# Patient Record
Sex: Female | Born: 1990
Health system: Southern US, Community
[De-identification: ages and names within clinical notes are randomized; demographics above are authoritative.]

## PROBLEM LIST (undated history)

## (undated) ENCOUNTER — Ambulatory Visit: Payer: PRIVATE HEALTH INSURANCE

## (undated) DIAGNOSIS — Z9889 Other specified postprocedural states: Secondary | ICD-10-CM

## (undated) DIAGNOSIS — A609 Anogenital herpesviral infection, unspecified: Secondary | ICD-10-CM

## (undated) DIAGNOSIS — Z113 Encounter for screening for infections with a predominantly sexual mode of transmission: Secondary | ICD-10-CM

## (undated) DIAGNOSIS — Z309 Encounter for contraceptive management, unspecified: Secondary | ICD-10-CM

## (undated) DIAGNOSIS — Z202 Contact with and (suspected) exposure to infections with a predominantly sexual mode of transmission: Secondary | ICD-10-CM

## (undated) DIAGNOSIS — E559 Vitamin D deficiency, unspecified: Secondary | ICD-10-CM

## (undated) DIAGNOSIS — N760 Acute vaginitis: Secondary | ICD-10-CM

## (undated) DIAGNOSIS — D649 Anemia, unspecified: Secondary | ICD-10-CM

## (undated) DIAGNOSIS — IMO0001 Reserved for inherently not codable concepts without codable children: Secondary | ICD-10-CM

## (undated) DIAGNOSIS — R300 Dysuria: Secondary | ICD-10-CM

## (undated) DIAGNOSIS — N898 Other specified noninflammatory disorders of vagina: Secondary | ICD-10-CM

## (undated) DIAGNOSIS — N390 Urinary tract infection, site not specified: Secondary | ICD-10-CM

## (undated) DIAGNOSIS — B9689 Other specified bacterial agents as the cause of diseases classified elsewhere: Secondary | ICD-10-CM

## (undated) HISTORY — DX: Encounter for contraceptive management, unspecified: Z30.9

## (undated) HISTORY — DX: Anogenital herpesviral infection, unspecified: A60.9

## (undated) HISTORY — DX: Anemia, unspecified: D64.9

## (undated) HISTORY — DX: Contact with and (suspected) exposure to infections with a predominantly sexual mode of transmission: Z20.2

## (undated) HISTORY — DX: Reserved for inherently not codable concepts without codable children: IMO0001

## (undated) HISTORY — PX: OTHER SURGICAL HISTORY: SHX169

## (undated) HISTORY — DX: Acute vaginitis: N76.0

## (undated) HISTORY — DX: Other specified bacterial agents as the cause of diseases classified elsewhere: B96.89

## (undated) HISTORY — DX: Other specified noninflammatory disorders of vagina: N89.8

## (undated) HISTORY — DX: Other specified postprocedural states: Z98.890

## (undated) HISTORY — DX: Vitamin D deficiency, unspecified: E55.9

## (undated) HISTORY — DX: Encounter for screening for infections with a predominantly sexual mode of transmission: Z11.3

## (undated) HISTORY — DX: Dysuria: R30.0

---
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
## 2008-05-23 ENCOUNTER — Emergency Department (HOSPITAL_COMMUNITY): Admission: EM | Admit: 2008-05-23 | Discharge: 2008-05-23 | Payer: Self-pay | Admitting: Emergency Medicine

## 2010-04-14 ENCOUNTER — Inpatient Hospital Stay (HOSPITAL_COMMUNITY)
Admission: AD | Admit: 2010-04-14 | Discharge: 2010-04-14 | Payer: Self-pay | Source: Home / Self Care | Admitting: Obstetrics & Gynecology

## 2010-04-29 ENCOUNTER — Inpatient Hospital Stay (HOSPITAL_COMMUNITY)
Admission: AD | Admit: 2010-04-29 | Discharge: 2010-05-01 | Payer: Self-pay | Source: Home / Self Care | Attending: Obstetrics and Gynecology | Admitting: Obstetrics and Gynecology

## 2010-04-30 DIAGNOSIS — O165 Unspecified maternal hypertension, complicating the puerperium: Secondary | ICD-10-CM

## 2010-07-26 ENCOUNTER — Emergency Department (HOSPITAL_COMMUNITY)
Admission: EM | Admit: 2010-07-26 | Discharge: 2010-07-26 | Disposition: A | Discharge status: Home / Self Care | Payer: Medicaid Other | Attending: Emergency Medicine | Admitting: Emergency Medicine

## 2010-07-26 DIAGNOSIS — H00019 Hordeolum externum unspecified eye, unspecified eyelid: Secondary | ICD-10-CM | POA: Insufficient documentation

## 2010-07-26 DIAGNOSIS — H5789 Other specified disorders of eye and adnexa: Secondary | ICD-10-CM | POA: Insufficient documentation

## 2010-08-03 LAB — RPR: RPR Ser Ql: NONREACTIVE

## 2010-08-03 LAB — CBC
HCT: 32 % — ABNORMAL LOW (ref 36.0–46.0)
Hemoglobin: 10.3 g/dL — ABNORMAL LOW (ref 12.0–15.0)
MCH: 25.9 pg — ABNORMAL LOW (ref 26.0–34.0)
MCHC: 32.2 g/dL (ref 30.0–36.0)
MCV: 80.3 fL (ref 78.0–100.0)
Platelets: 199 10*3/uL (ref 150–400)
RBC: 3.98 MIL/uL (ref 3.87–5.11)
RDW: 14.7 % (ref 11.5–15.5)
WBC: 12.1 10*3/uL — ABNORMAL HIGH (ref 4.0–10.5)

## 2010-08-03 LAB — WET PREP, GENITAL
Trich, Wet Prep: NONE SEEN
Yeast Wet Prep HPF POC: NONE SEEN

## 2010-08-03 LAB — GC/CHLAMYDIA PROBE AMP, GENITAL
Chlamydia, DNA Probe: NEGATIVE
GC Probe Amp, Genital: NEGATIVE

## 2010-10-08 NOTE — H&P (Signed)
April Tyler, April Tyler NO.:  [id number]   MEDICAL RECORD NO.:  [id number]          PATIENT TYPE:  INP   LOCATION:  A413                          FACILITY:  [facility]   PHYSICIAN:  Langley Gauss, MD     DATE OF BIRTH:  Nov 05, 1990   DATE OF ADMISSION:  06/11/2005  DATE OF DISCHARGE:  LH                                HISTORY & PHYSICAL   HISTORY:  The patient is a 20 year old gravida 1, para 0 at [redacted] weeks  gestation who presents to [facility] afternoon of June 11, 2005  complaining of increased frequency and intensity of uterine contractions.  Initial examination she was found to be 2 cm dilated. During this  observation period she is noted to have increased frequency and intensity of  uterine contractions such that she is admitted in labor. Membranes were  intact. Prenatal course notably is uncomplicated. The patient did decline  the maternal serum AFP screening. The patient has had serial ultrasounds  which have documented adequate fetal growth and normal anatomic survey. One-  hour GTT is normal at 80. She is sickle negative. GBS carrier status is  reportedly negative. A positive blood type.   PAST MEDICAL HISTORY:  No previous hospitalizations. This is her first  pregnancy.   SOCIAL HISTORY:  The patient is currently living with the grandmother. She  is no longer involved in a relationship with the father of the baby.   PHYSICAL EXAMINATION:  GENERAL:  She is found to be in no acute distress.  VITAL SIGNS:  Blood pressure is 115/70, pulse rate of 80, respiratory rate  is 20.  HEENT:  Negative. No adenopathy. Neck is supple. Thyroid is nonpalpable.  CHEST:  Lungs are clear. Cardiovascular exam is regular rate and rhythm.  EXTREMITIES:  Normal.  ABDOMEN:  Gravid uterus identified. Vertex presentation by Leopold's  maneuvers.  PELVIC:  Normal external genitalia, no lesions or ulcerations identified. No  vaginal bleeding, no leakage of fluid. Cervix 3+  cm dilated, 90% effaced,  vertex at 0 station. External fetal monitor reveals reassuring fetal heart  rate baseline 150, accelerations noted, uterine contractions occurring q.3-5  minutes.   ASSESSMENT:  A 20 year old patient [redacted] weeks gestation presents in early  stages of labor with cervical change documented during this observation  period. The patient as noted in labor at this time. Subsequently, we will  augment with Pitocin as clinically indicated.      Langley Gauss, MD  Electronically Signed    DC/MEDQ  D:  06/12/2005  T:  06/12/2005  Job:  [id number]   cc:   [facility]

## 2010-10-08 NOTE — Op Note (Signed)
NAMEMARIZOL, BORROR NO.:  [id number]   MEDICAL RECORD NO.:  [id number]          PATIENT TYPE:  INP   LOCATION:  A412                          FACILITY:  [facility]   PHYSICIAN:  Tilda Burrow, M.D. DATE OF BIRTH:  1991/05/01   DATE OF PROCEDURE:  05/15/2006  DATE OF DISCHARGE:                               OPERATIVE REPORT   Yanisa presented in early morning hours in labor, progressed rapidly  to completely dilated.  She pushed through a brief second stage.  The  baby was found to be in a persistent occiput posterior presentation in a  rather military position of the head.  The orbits and eyebrow could be  palpable beneath the symphysis pubis.  She made some progress, but was  slowing in her progress with variable decelerations becoming more  pronounced.  Vacuum assistance was then offered, accepted and utilized  to place on the back of the head and to use traction through two  contractions to bring baby into a more flexed head position and to  descend slightly.  The vacuum popped off twice due to the generous hair  on the baby and inadequate seal.  Therefore, we discontinued it and the  patient was able to use her own pushing powers to bring the baby to  crowning position to deliver over perineum without episiotomy.  There  was first degree laceration.  She delivered direct OP position.  The  anterior shoulder was released and then the fetal body expelled the rest  of the way without difficulty.  Apgars were 9 and 9.  Amniotic fluid was  clear without malodor.  The small laceration did not require repair.  The patient did not have an epidural.  Placenta delivered intact.  Tomasa Blase presentation, three-vessel cord confirmed.  Infant was a healthy  female infant, Apgars 9 and 9.      Tilda Burrow, M.D.  Electronically Signed     JVF/MEDQ  D:  05/15/2006  T:  05/15/2006  Job:  [id number]

## 2010-10-08 NOTE — H&P (Signed)
April Tyler, April Tyler NO.:  [id number]   MEDICAL RECORD NO.:  [id number]          PATIENT TYPE:  INP   LOCATION:  A412                          FACILITY:  [facility]   PHYSICIAN:  Tilda Burrow, M.D. DATE OF BIRTH:  1990/10/25   DATE OF ADMISSION:  05/15/2006  DATE OF DISCHARGE:  LH                              HISTORY & PHYSICAL   HISTORY OF PRESENT ILLNESS:  [name], a 20 year old  multiparous female, is admitted after pregnancy course followed through  our office.  The patient presents at approximately 6:00 A.M. on May 15, 2006 with active contractions.  Cervix is 4 cm dilated.  Her  prenatal course includes blood type A positive, urine drug screen  negative, Rubella immunity present, hemoglobin 11, hematocrit 34.  Hepatitis, HIV, Gonorrhea and Chlamydia all negative.  Group B Strep  positive with prior pregnancy.  Sickle test negative.  She plans on  having medicines; she requested IV medicines initially.   PAST MEDICAL HISTORY:  None.   PAST SURGICAL HISTORY:  Negative.   ALLERGIES:  None.   SOCIAL HISTORY:  Single. Lives with her family.  Paternity is uncertain.   PLAN:  Prognosis for vaginal delivery is good.      Tilda Burrow, M.D.  Electronically Signed     JVF/MEDQ  D:  05/15/2006  T:  05/15/2006  Job:  [id number]

## 2010-10-08 NOTE — Op Note (Signed)
NAMEBREEA, LONCAR NO.:  [id number]   MEDICAL RECORD NO.:  [id number]          PATIENT TYPE:  INP   LOCATION:  A413                          FACILITY:  [facility]   PHYSICIAN:  Langley Gauss, MD     DATE OF BIRTH:  1991-01-09   DATE OF PROCEDURE:  06/12/2005  DATE OF DISCHARGE:                                 OPERATIVE REPORT   DELIVERY NOTE:   DIAGNOSIS:  Term pregnancy presenting in labor.   DELIVERY FORM:  Spontaneous assisted vaginal of 8 pounds 2 ounce infant.  Midline episiotomy repair.   ANALGESIA:  The patient received IV Nubain and Phenergan during the course  of labor. Total of 30 mL 1% lidocaine plain injected in the perineal body at  time of delivery.   TOTAL ESTIMATED BLOOD LOSS:  Less than 500 mL.   DELIVERY PERFORMED BY:  Dr. Langley Gauss.   COMPLICATIONS:  None.   SPECIMENS:  Arterial cord gas to cord blood laboratory. Placenta is examined  noted be apparently intact with three-vessel umbilical cord.   SUMMARY:  A 20 year old gravida 1, para 0 with an EDC of June 17, 2005  who presented June 11, 2005 complaining of increased frequency and  intensity of uterine contractions.  The was noted to have cervical change to  3+ centimeters during observation period. Thus she was admitted in labor.  Subsequently, she did require Pitocin augmentation due to inadequate  frequency and of uterine contractions.  She progressed rapidly along the  labor curve to complete dilatation with a strong urge to push.  She was  placed in dorsal lithotomy position, prepped and draped usual sterile  manner. She pushed well with a short second stage of labor with distension  of the perineal body, 30 mL 1% lidocaine plain is injected, small midline  episiotomy was performed, infant delivered in direct OA position over this  midline episiotomy without extension.  Mouth and nose bulb suctioned of  clear amniotic fluid. Spontaneous rotation occurred to the right  anterior  shoulder position. Gentle traction combined expulsive efforts, resulted in  delivery of anterior shoulder as well as remaining infant without  difficulty. Umbilical cord is then milked toward the infant, cord was doubly  clamped and cut. Infant is taken to nursery table for immediate assessment  after cord gas and cord blood was obtained. Gentle traction of umbilical  cord results in separation which upon examination is noted be intact  placenta with three-vessel umbilical cord.  __________  delivery.  Examination of the genital tract reveals midline episiotomy which is not  extended.  This was repaired with __________  0 chromic in a running  locked fashion on vaginal mucosa followed by two-layer closure of 0 chromic  on the perineal body. The patient tolerated delivery very well. She is taken  out of dorsal lithotomy position and allowed to bond with the infant.  Apparently it is the patient's father who was in attendance at time of  delivery.      Langley Gauss, MD  Electronically Signed     DC/MEDQ  D:  06/12/2005  T:  06/12/2005  Job:  [id number]   cc:   [facility]

## 2011-09-01 ENCOUNTER — Other Ambulatory Visit (HOSPITAL_COMMUNITY)
Admission: RE | Admit: 2011-09-01 | Discharge: 2011-09-01 | Disposition: A | Discharge status: Home / Self Care | Payer: Medicaid Other | Source: Ambulatory Visit | Attending: Obstetrics and Gynecology | Admitting: Obstetrics and Gynecology

## 2011-09-01 DIAGNOSIS — Z113 Encounter for screening for infections with a predominantly sexual mode of transmission: Secondary | ICD-10-CM | POA: Insufficient documentation

## 2011-09-01 DIAGNOSIS — Z01419 Encounter for gynecological examination (general) (routine) without abnormal findings: Secondary | ICD-10-CM | POA: Insufficient documentation

## 2011-10-11 ENCOUNTER — Emergency Department (HOSPITAL_COMMUNITY)
Admission: EM | Admit: 2011-10-11 | Discharge: 2011-10-11 | Disposition: A | Discharge status: Home / Self Care | Payer: Medicaid Other | Attending: Emergency Medicine | Admitting: Emergency Medicine

## 2011-10-11 ENCOUNTER — Encounter (HOSPITAL_COMMUNITY): Payer: Self-pay

## 2011-10-11 DIAGNOSIS — L270 Generalized skin eruption due to drugs and medicaments taken internally: Secondary | ICD-10-CM | POA: Insufficient documentation

## 2011-10-11 DIAGNOSIS — T360X5A Adverse effect of penicillins, initial encounter: Secondary | ICD-10-CM | POA: Insufficient documentation

## 2011-10-11 DIAGNOSIS — L500 Allergic urticaria: Secondary | ICD-10-CM | POA: Insufficient documentation

## 2011-10-11 DIAGNOSIS — T7840XA Allergy, unspecified, initial encounter: Secondary | ICD-10-CM

## 2011-10-11 MED ORDER — AZITHROMYCIN 250 MG PO TABS: ORAL_TABLET | ORAL | Status: DC

## 2011-10-11 MED ORDER — PREDNISONE 50 MG PO TABS: 50.0000 mg | ORAL_TABLET | Freq: Every day | ORAL | Status: AC

## 2011-10-11 MED ORDER — AZITHROMYCIN 250 MG PO TABS
500.0000 mg | ORAL_TABLET | Freq: Once | ORAL | Status: AC
  Administered 2011-10-11: 500 mg via ORAL
  Filled 2011-10-11: qty 2

## 2011-10-11 MED ORDER — PREDNISONE 20 MG PO TABS
60.0000 mg | ORAL_TABLET | Freq: Once | ORAL | Status: AC
  Administered 2011-10-11: 60 mg via ORAL
  Filled 2011-10-11: qty 3

## 2011-10-11 NOTE — ED Notes (Signed)
Was dx w/ strep throat 2 weeks ago, took 6 days of antibiotic and then broke out in rash to face, neck and chest, denies any sob.

## 2011-10-11 NOTE — Discharge Instructions (Signed)

## 2011-10-11 NOTE — ED Provider Notes (Signed)
History     CSN: [id number]  Arrival date & time 10/11/11  1114   None     Chief Complaint  Patient presents with  . Rash    (Consider location/radiation/quality/duration/timing/severity/associated sxs/prior treatment) HPI Comments: Seen last week at an urgent care.  Prescribed amoxicillin for strep throat.  Took 5 days but stopped several days ago because she broke out in a rash.  No difficulty breathing or swallowing.  Not taking any meds to tx.  Patient is a 21 y.o. female presenting with rash. The history is provided by the patient. No language interpreter was used.  Rash  This is a new problem. The problem has not changed since onset.The problem is associated with new medications. There has been no fever. The pain is moderate. The pain has been constant since onset. Associated symptoms include itching. She has tried nothing for the symptoms. Risk factors include new medications.    History reviewed. No pertinent past medical history.  History reviewed. No pertinent past surgical history.  No family history on file.  History  Substance Use Topics  . Smoking status: Never Smoker   . Smokeless tobacco: Not on file  . Alcohol Use: Yes     occ    OB History    Grav Para Term Preterm Abortions TAB SAB Ect Mult Living                  Review of Systems  HENT: Negative for sore throat.   Respiratory: Negative for shortness of breath and wheezing.   Skin: Positive for itching and rash.       Urticaria     Allergies  Review of patient's allergies indicates no known allergies.  Home Medications   Current Outpatient Rx  Name Route Sig Dispense Refill  . AZITHROMYCIN 250 MG PO TABS  Take one tab po QD (initial dose given in the ED). 4 tablet 0  . PREDNISONE 50 MG PO TABS Oral Take 1 tablet (50 mg total) by mouth daily. 6 tablet 0    BP 129/77  Pulse 78  Temp(Src) 98.2 F (36.8 C) (Oral)  Resp 20  Ht 5\' 8"  (1.727 m)  Wt 220 lb (99.791 kg)  BMI 33.45 kg/m2   SpO2 100%  LMP 10/05/2011  Physical Exam  Nursing note and vitals reviewed. Constitutional: She is oriented to person, place, and time. She appears well-developed and well-nourished. No distress.  HENT:  Head: Normocephalic and atraumatic.  Eyes: EOM are normal.  Neck: Normal range of motion.  Cardiovascular: Normal rate, regular rhythm and normal heart sounds.   Pulmonary/Chest: Effort normal and breath sounds normal. No accessory muscle usage. Not tachypneic. No respiratory distress. She has no decreased breath sounds.  Abdominal: Soft. She exhibits no distension. There is no tenderness.  Musculoskeletal: Normal range of motion.  Neurological: She is alert and oriented to person, place, and time.  Skin: Skin is warm and dry.       Urticaria  Scattered over face, neck  Psychiatric: She has a normal mood and affect. Judgment normal.    ED Course  Procedures (including critical care time)  Labs Reviewed - No data to display No results found.   1. Allergic reaction caused by a drug       MDM  zithromax Prednisone.  Benadryl, pepcid.  Return prn.        Worthy Rancher, PA 10/11/11 1253

## 2011-10-11 NOTE — ED Provider Notes (Signed)
Medical screening examination/treatment/procedure(s) were performed by non-physician practitioner and as supervising physician I was immediately available for consultation/collaboration.   Marleen Moret W Cloie Wooden, MD 10/11/11 1538 

## 2011-10-11 NOTE — ED Notes (Signed)
Rash to face and trunk over 1 week.  Rash started after taking amoxicillin for 5-6 days.  Was taking med for strep throat and did not   Complete.  No sob or difficulty swallowing.

## 2012-08-07 ENCOUNTER — Ambulatory Visit: Payer: Self-pay

## 2012-09-19 ENCOUNTER — Ambulatory Visit: Payer: Medicaid Other | Admitting: Obstetrics & Gynecology

## 2012-09-19 ENCOUNTER — Encounter: Payer: Self-pay | Admitting: *Deleted

## 2013-01-31 ENCOUNTER — Encounter (HOSPITAL_COMMUNITY): Payer: Self-pay | Admitting: Emergency Medicine

## 2013-01-31 ENCOUNTER — Emergency Department (HOSPITAL_COMMUNITY)
Admission: EM | Admit: 2013-01-31 | Discharge: 2013-02-01 | Disposition: A | Discharge status: Home / Self Care | Payer: Medicaid Other | Attending: Emergency Medicine | Admitting: Emergency Medicine

## 2013-01-31 DIAGNOSIS — Z3202 Encounter for pregnancy test, result negative: Secondary | ICD-10-CM | POA: Insufficient documentation

## 2013-01-31 DIAGNOSIS — R3915 Urgency of urination: Secondary | ICD-10-CM | POA: Insufficient documentation

## 2013-01-31 DIAGNOSIS — R300 Dysuria: Secondary | ICD-10-CM

## 2013-01-31 DIAGNOSIS — R350 Frequency of micturition: Secondary | ICD-10-CM | POA: Insufficient documentation

## 2013-01-31 LAB — URINALYSIS, ROUTINE W REFLEX MICROSCOPIC
Bilirubin Urine: NEGATIVE
Glucose, UA: NEGATIVE mg/dL
Hgb urine dipstick: NEGATIVE
Ketones, ur: NEGATIVE mg/dL
Nitrite: NEGATIVE
Protein, ur: NEGATIVE mg/dL
Specific Gravity, Urine: 1.027 (ref 1.005–1.030)
Urobilinogen, UA: 0.2 mg/dL (ref 0.0–1.0)
pH: 5.5 (ref 5.0–8.0)

## 2013-01-31 LAB — URINE MICROSCOPIC-ADD ON

## 2013-01-31 LAB — POCT PREGNANCY, URINE: Preg Test, Ur: NEGATIVE

## 2013-01-31 NOTE — ED Notes (Signed)
Pt. reports dysuria for several days , denies hematuria , no fever or chills.

## 2013-02-01 LAB — URINE CULTURE
Colony Count: NO GROWTH
Culture: NO GROWTH

## 2013-02-01 NOTE — ED Notes (Signed)
Pt dc to home. Pt sts understanding to dc instructions. Pt ambulatory to exit without difficulty. 

## 2013-02-01 NOTE — ED Provider Notes (Signed)
CSN: [id number]     Arrival date & time 01/31/13  2108 History   First MD Initiated Contact with Patient 01/31/13 2355     Chief Complaint  Patient presents with  . Dysuria   (Consider location/radiation/quality/duration/timing/severity/associated sxs/prior Treatment) HPI Comments: Patient presents for dysuria x 4 months. Patient states that symptoms have been intermittent without any aggravating factors. States symptoms mildly improved with AZO tabs. She describes pain with voiding as pressure like. Endorses associated urinary urgency and frequency. She denies fever, hematuria, N/V, abdominal pain, back pain, vaginal d/c, pelvic pain, diarrhea, and pain with defecation.  Patient is a 22 y.o. female presenting with dysuria.  Dysuria   History reviewed. No pertinent past medical history. History reviewed. No pertinent past surgical history. No family history on file. History  Substance Use Topics  . Smoking status: Never Smoker   . Smokeless tobacco: Not on file  . Alcohol Use: Yes     Comment: occ   OB History   Grav Para Term Preterm Abortions TAB SAB Ect Mult Living                 Review of Systems  Genitourinary: Positive for dysuria.  All other systems reviewed and are negative.    Allergies  Review of patient's allergies indicates no known allergies.  Home Medications   Current Outpatient Rx  Name  Route  Sig  Dispense  Refill  . acetaminophen (TYLENOL) 325 MG tablet   Oral   Take 650 mg by mouth every 6 (six) hours as needed for pain.         . cyanocobalamin 500 MCG tablet   Oral   Take 500-1,000 mcg by mouth daily.         Marland Kitchen triamcinolone cream (KENALOG) 0.5 %   Topical   Apply 1 application topically as needed (ecezma).          BP 146/73  Pulse 89  Temp(Src) 99.1 F (37.3 C) (Oral)  Resp 14  SpO2 98%  LMP 01/04/2013  Physical Exam  Nursing note and vitals reviewed. Constitutional: She is oriented to person, place, and time. She appears  well-developed and well-nourished. No distress.  HENT:  Head: Normocephalic and atraumatic.  Mouth/Throat: Oropharynx is clear and moist. No oropharyngeal exudate.  Eyes: Conjunctivae and EOM are normal. Pupils are equal, round, and reactive to light. No scleral icterus.  Neck: Normal range of motion.  Cardiovascular: Normal rate, regular rhythm and intact distal pulses.   Pulmonary/Chest: Effort normal. No respiratory distress. She has no wheezes. She has no rales.  Abdominal: Soft. She exhibits no distension. There is no tenderness.  Musculoskeletal: Normal range of motion.  Neurological: She is alert and oriented to person, place, and time.  Skin: Skin is warm and dry. No rash noted. She is not diaphoretic. No erythema. No pallor.  Psychiatric: She has a normal mood and affect. Her behavior is normal.    ED Course  Procedures (including critical care time) Labs Review Labs Reviewed  URINALYSIS, ROUTINE W REFLEX MICROSCOPIC - Abnormal; Notable for the following:    APPearance CLOUDY (*)    Leukocytes, UA SMALL (*)    All other components within normal limits  URINE MICROSCOPIC-ADD ON - Abnormal; Notable for the following:    Squamous Epithelial / LPF MANY (*)    Bacteria, UA FEW (*)    All other components within normal limits  URINE CULTURE  POCT PREGNANCY, URINE   Imaging Review No results found.  MDM   1. Dysuria    22 y/o otherwise healthy female presenting for intermittent dysuria x 4 months. Patient well and nontoxic appearing, hemodynamically stable, and afebrie. Physical exam unremarkable; no abdominal TTP or CVA TTP. Urine pregnancy negative and UA not suggestive of infection; few bacteria, small leuks, and negative nitrites. Patient stable and appropriate for d/c with PCP follow up. Advised that urine culture pending. Return precautions discussed and patient agreeable to plan with no unaddressed concerns.    Antony Madura, PA-C 02/02/13 1100

## 2013-02-03 NOTE — ED Provider Notes (Signed)
Medical screening examination/treatment/procedure(s) were performed by non-physician practitioner and as supervising physician I was immediately available for consultation/collaboration.  Jasmine Awe, MD 02/03/13 [phone]

## 2013-09-20 ENCOUNTER — Encounter (HOSPITAL_COMMUNITY): Payer: Self-pay | Admitting: Emergency Medicine

## 2013-09-20 ENCOUNTER — Emergency Department (HOSPITAL_COMMUNITY)
Admission: EM | Admit: 2013-09-20 | Discharge: 2013-09-20 | Discharge status: Against Medical Advice | Payer: Medicaid Other | Attending: Emergency Medicine | Admitting: Emergency Medicine

## 2013-09-20 DIAGNOSIS — M259 Joint disorder, unspecified: Secondary | ICD-10-CM | POA: Insufficient documentation

## 2013-09-20 DIAGNOSIS — R109 Unspecified abdominal pain: Secondary | ICD-10-CM | POA: Insufficient documentation

## 2013-09-20 DIAGNOSIS — N898 Other specified noninflammatory disorders of vagina: Secondary | ICD-10-CM | POA: Insufficient documentation

## 2013-09-20 DIAGNOSIS — M899 Disorder of bone, unspecified: Secondary | ICD-10-CM | POA: Insufficient documentation

## 2013-09-20 DIAGNOSIS — O9989 Other specified diseases and conditions complicating pregnancy, childbirth and the puerperium: Secondary | ICD-10-CM | POA: Insufficient documentation

## 2013-09-20 DIAGNOSIS — O99891 Other specified diseases and conditions complicating pregnancy: Secondary | ICD-10-CM | POA: Insufficient documentation

## 2013-09-20 DIAGNOSIS — Z79899 Other long term (current) drug therapy: Secondary | ICD-10-CM | POA: Insufficient documentation

## 2013-09-20 DIAGNOSIS — R300 Dysuria: Secondary | ICD-10-CM | POA: Insufficient documentation

## 2013-09-20 DIAGNOSIS — R509 Fever, unspecified: Secondary | ICD-10-CM | POA: Insufficient documentation

## 2013-09-20 DIAGNOSIS — IMO0002 Reserved for concepts with insufficient information to code with codable children: Secondary | ICD-10-CM | POA: Insufficient documentation

## 2013-09-20 DIAGNOSIS — Z87440 Personal history of urinary (tract) infections: Secondary | ICD-10-CM | POA: Insufficient documentation

## 2013-09-20 DIAGNOSIS — H5710 Ocular pain, unspecified eye: Secondary | ICD-10-CM | POA: Insufficient documentation

## 2013-09-20 DIAGNOSIS — O26899 Other specified pregnancy related conditions, unspecified trimester: Secondary | ICD-10-CM

## 2013-09-20 DIAGNOSIS — M549 Dorsalgia, unspecified: Secondary | ICD-10-CM | POA: Insufficient documentation

## 2013-09-20 HISTORY — DX: Urinary tract infection, site not specified: N39.0

## 2013-09-20 LAB — PREGNANCY, URINE: Preg Test, Ur: POSITIVE — AB

## 2013-09-20 LAB — URINALYSIS, ROUTINE W REFLEX MICROSCOPIC
Bilirubin Urine: NEGATIVE
Glucose, UA: NEGATIVE mg/dL
Hgb urine dipstick: NEGATIVE
Ketones, ur: NEGATIVE mg/dL
Nitrite: NEGATIVE
Protein, ur: NEGATIVE mg/dL
Specific Gravity, Urine: 1.025 (ref 1.005–1.030)
Urobilinogen, UA: 0.2 mg/dL (ref 0.0–1.0)
pH: 6 (ref 5.0–8.0)

## 2013-09-20 LAB — URINE MICROSCOPIC-ADD ON

## 2013-09-20 MED ORDER — CEPHALEXIN 500 MG PO CAPS
500.0000 mg | ORAL_CAPSULE | Freq: Three times a day (TID) | ORAL | Status: DC
Start: 2013-09-20 — End: 2013-11-29

## 2013-09-20 NOTE — ED Provider Notes (Signed)
CSN: [id number]     Arrival date & time 09/20/13  1614 History   First MD Initiated Contact with Patient 09/20/13 1622     Chief Complaint  Patient presents with  . Dysuria     (Consider location/radiation/quality/duration/timing/severity/associated sxs/prior Treatment) Patient is a 23 y.o. female presenting with dysuria. The history is provided by the patient.  Dysuria Pain quality:  Burning Pain severity:  Moderate Duration:  2 weeks Timing:  Constant Progression:  Worsening Chronicity:  New Recent urinary tract infections: no   Relieved by:  Nothing Worsened by:  Nothing tried Ineffective treatments:  Cranberry juice and phenazopyridine Urinary symptoms: foul-smelling urine and frequent urination   Urinary symptoms: no hematuria and no bladder incontinence   Associated symptoms: fever, flank pain, nausea and vaginal discharge   Associated symptoms: no genital lesions and no vomiting  Abdominal pain: cramping.   Risk factors: sexually active and sexually transmitted infections   Risk factors: no hx of pyelonephritis, not pregnant, no recurrent urinary tract infections, not single kidney and no urinary catheter    April Tyler is a 23 y.o. G5P3, LMP 08/19/2013 who presents to the ED with dysuria. She states that she had the same symptoms early in April and took Azo. Got better for a while but now has returned and not getting better. Hx of STI during her last pregnancy. She is using no birth control.   Past Medical History  Diagnosis Date  . UTI (lower urinary tract infection)    History reviewed. No pertinent past surgical history. History reviewed. No pertinent family history. History  Substance Use Topics  . Smoking status: Never Smoker   . Smokeless tobacco: Not on file  . Alcohol Use: Yes     Comment: occ   OB History   Grav Para Term Preterm Abortions TAB SAB Ect Mult Living   5 3             Review of Systems  Constitutional: Positive for fever and chills.   HENT: Negative.   Eyes: Positive for pain. Negative for visual disturbance.  Respiratory: Negative for cough and wheezing.   Cardiovascular: Negative for chest pain.  Gastrointestinal: Positive for nausea. Negative for vomiting. Abdominal pain: cramping.  Genitourinary: Positive for dysuria, flank pain and vaginal discharge.  Musculoskeletal: Positive for back pain.  Skin: Negative for rash.  Psychiatric/Behavioral: Negative for confusion. The patient is not nervous/anxious.       Allergies  Review of patient's allergies indicates no known allergies.  Home Medications   Prior to Admission medications   Medication Sig Start Date End Date Taking? Authorizing Provider  acetaminophen (TYLENOL) 325 MG tablet Take 650 mg by mouth every 6 (six) hours as needed for pain.    Historical Provider, MD  cyanocobalamin 500 MCG tablet Take 500-1,000 mcg by mouth daily.    Historical Provider, MD  triamcinolone cream (KENALOG) 0.5 % Apply 1 application topically as needed (ecezma).    Historical Provider, MD   BP 129/74  Pulse 90  Temp(Src) 98.1 F (36.7 C) (Oral)  Resp 16  Ht 5\' 8"  (1.727 m)  Wt 210 lb (95.255 kg)  BMI 31.94 kg/m2  SpO2 100%  LMP 08/19/2013 Physical Exam  Nursing note and vitals reviewed. Constitutional: She is oriented to person, place, and time. She appears well-developed and well-nourished.  HENT:  Head: Normocephalic.  Eyes: Conjunctivae and EOM are normal.  Neck: Neck supple.  Cardiovascular: Normal rate.   Pulmonary/Chest: Effort normal.  Musculoskeletal: Normal  range of motion.  Neurological: She is alert and oriented to person, place, and time. No cranial nerve deficit.  Skin: Skin is warm and dry.  Psychiatric: She has a normal mood and affect. Her behavior is normal.   Results for orders placed during the hospital encounter of 09/20/13 (from the past 24 hour(s))  URINALYSIS, ROUTINE W REFLEX MICROSCOPIC     Status: Abnormal   Collection Time     09/20/13  4:23 PM      Result Value Ref Range   Color, Urine YELLOW  YELLOW   APPearance CLEAR  CLEAR   Specific Gravity, Urine 1.025  1.005 - 1.030   pH 6.0  5.0 - 8.0   Glucose, UA NEGATIVE  NEGATIVE mg/dL   Hgb urine dipstick NEGATIVE  NEGATIVE   Bilirubin Urine NEGATIVE  NEGATIVE   Ketones, ur NEGATIVE  NEGATIVE mg/dL   Protein, ur NEGATIVE  NEGATIVE mg/dL   Urobilinogen, UA 0.2  0.0 - 1.0 mg/dL   Nitrite NEGATIVE  NEGATIVE   Leukocytes, UA SMALL (*) NEGATIVE  PREGNANCY, URINE     Status: Abnormal   Collection Time    09/20/13  4:23 PM      Result Value Ref Range   Preg Test, Ur POSITIVE (*) NEGATIVE  URINE MICROSCOPIC-ADD ON     Status: Abnormal   Collection Time    09/20/13  4:23 PM      Result Value Ref Range   Squamous Epithelial / LPF MANY (*) RARE   WBC, UA 3-6  <3 WBC/hpf   Bacteria, UA FEW (*) RARE     ED Course: I discussed this case with Dr. Fonnie JarvisBednar.   Procedures  MDM  23 y.o. female with abdominal pain and positive pregnancy test. Discussed with the patient that the treatment plan would be pelvic exam, labs and ultrasound. The patient states that she does not want any of that done. She just wants treated for her UTI. I explained that her urine did not look infected and that with her history of STI's she could have an infection that caused dysuria that was not a UTI. I discussed that her previous STI's put her at risk for ectopic pregnancy. She declined any further treatment. I encouraged her to return immediately for worsening symptoms. I discussed complications associated with ectopic pregnancy including death. She voices understanding and signed out AMA. I did send her urine for culture and gave Rx for antibiotics. I also sent a message to Dr. Emelda FearFerguson since she is a patient in his office.     [facility] Orlene OchM Lachandra Dettmann, [place] 09/20/13 [phone]

## 2013-09-20 NOTE — Discharge Instructions (Signed)
Your pregnancy test today is positive. Since you are having pain it is possible that you could have a pregnancy in your tube. You are signing out today against medical advice. If you have an ectopic pregnancy it could rupture and you could die.   Abdominal Pain During Pregnancy Abdominal pain is common in pregnancy. Most of the time, it does not cause harm. There are many causes of abdominal pain. Some causes are more serious than others. Some of the causes of abdominal pain in pregnancy are easily diagnosed. Occasionally, the diagnosis takes time to understand. Other times, the cause is not determined. Abdominal pain can be a sign that something is very wrong with the pregnancy, or the pain may have nothing to do with the pregnancy at all. For this reason, always tell your health care provider if you have any abdominal discomfort. HOME CARE INSTRUCTIONS  Monitor your abdominal pain for any changes. The following actions may help to alleviate any discomfort you are experiencing:  Do not have sexual intercourse or put anything in your vagina until your symptoms go away completely.  Get plenty of rest until your pain improves.  Drink clear fluids if you feel nauseous. Avoid solid food as long as you are uncomfortable or nauseous.  Only take over-the-counter or prescription medicine as directed by your health care provider.  Keep all follow-up appointments with your health care provider. SEEK IMMEDIATE MEDICAL CARE IF:  You are bleeding, leaking fluid, or passing tissue from the vagina.  You have increasing pain or cramping.  You have persistent vomiting.  You have painful or bloody urination.  You have a fever.  You notice a decrease in your baby's movements.  You have extreme weakness or feel faint.  You have shortness of breath, with or without abdominal pain.  You develop a severe headache with abdominal pain.  You have abnormal vaginal discharge with abdominal pain.  You have  persistent diarrhea.  You have abdominal pain that continues even after rest, or gets worse. MAKE SURE YOU:   Understand these instructions.  Will watch your condition.  Will get help right away if you are not doing well or get worse. Document Released: 05/09/2005 Document Revised: 02/27/2013 Document Reviewed: 12/06/2012 [facility] Patient Information 2014 [employer] [place].

## 2013-09-20 NOTE — ED Notes (Signed)
Pt c/o dysuria beginning of April took AZO and went away. States started again around mid April. Nad.

## 2013-09-23 ENCOUNTER — Other Ambulatory Visit: Payer: Self-pay | Admitting: Obstetrics and Gynecology

## 2013-09-23 DIAGNOSIS — N949 Unspecified condition associated with female genital organs and menstrual cycle: Secondary | ICD-10-CM

## 2013-09-23 DIAGNOSIS — O9989 Other specified diseases and conditions complicating pregnancy, childbirth and the puerperium: Principal | ICD-10-CM

## 2013-09-23 DIAGNOSIS — O99891 Other specified diseases and conditions complicating pregnancy: Secondary | ICD-10-CM

## 2013-09-24 ENCOUNTER — Other Ambulatory Visit: Payer: Medicaid Other

## 2013-09-25 ENCOUNTER — Ambulatory Visit (INDEPENDENT_AMBULATORY_CARE_PROVIDER_SITE_OTHER): Payer: Medicaid Other

## 2013-09-25 ENCOUNTER — Other Ambulatory Visit: Payer: Medicaid Other

## 2013-09-25 ENCOUNTER — Other Ambulatory Visit: Payer: Self-pay | Admitting: Obstetrics and Gynecology

## 2013-09-25 DIAGNOSIS — O9989 Other specified diseases and conditions complicating pregnancy, childbirth and the puerperium: Principal | ICD-10-CM

## 2013-09-25 DIAGNOSIS — Z3200 Encounter for pregnancy test, result unknown: Secondary | ICD-10-CM

## 2013-09-25 DIAGNOSIS — N949 Unspecified condition associated with female genital organs and menstrual cycle: Secondary | ICD-10-CM

## 2013-09-25 DIAGNOSIS — O99891 Other specified diseases and conditions complicating pregnancy: Secondary | ICD-10-CM

## 2013-09-25 DIAGNOSIS — N926 Irregular menstruation, unspecified: Secondary | ICD-10-CM

## 2013-09-25 NOTE — ED Provider Notes (Signed)
Medical screening examination/treatment/procedure(s) were performed by non-physician practitioner and as supervising physician I was immediately available for consultation/collaboration.   Mai Longnecker M Destenie Ingber, MD 09/25/13 [id number] 

## 2013-09-25 NOTE — Progress Notes (Signed)
U/S- transvaginal u/s- Endom with small amount of fluid noted within endometrial cavity although no gestational sac noted within, cx appears closed, small amount of free fluid noted in posterior cul-de-sac, bilateral adnexa/ovaries appear WNL no adnexal masses noted

## 2013-09-26 ENCOUNTER — Telehealth: Payer: Self-pay | Admitting: Obstetrics & Gynecology

## 2013-09-26 LAB — HCG, QUANTITATIVE, PREGNANCY: hCG, Beta Chain, Quant, S: 1554.4 m[IU]/mL

## 2013-09-26 NOTE — Telephone Encounter (Signed)
Pt aware of results, I spoke with Drenda FreezeFran and she advised to [facility] to recheck the pt's numbers. Pt verbalized understanding, pt switched to front office for appointment.

## 2013-09-27 ENCOUNTER — Other Ambulatory Visit: Payer: Medicaid Other

## 2013-09-27 DIAGNOSIS — Z3200 Encounter for pregnancy test, result unknown: Secondary | ICD-10-CM

## 2013-09-28 LAB — HCG, QUANTITATIVE, PREGNANCY: hCG, Beta Chain, Quant, S: 3531.9 m[IU]/mL

## 2013-09-30 ENCOUNTER — Telehealth: Payer: Self-pay | Admitting: Obstetrics and Gynecology

## 2013-09-30 NOTE — Telephone Encounter (Signed)
Pt informed of her HCG results from Friday, pt has appointment scheduled this Friday, advised pt to keep this appointment.

## 2013-10-03 ENCOUNTER — Other Ambulatory Visit: Payer: Self-pay | Admitting: Obstetrics & Gynecology

## 2013-10-03 DIAGNOSIS — O3680X0 Pregnancy with inconclusive fetal viability, not applicable or unspecified: Secondary | ICD-10-CM

## 2013-10-04 ENCOUNTER — Ambulatory Visit (INDEPENDENT_AMBULATORY_CARE_PROVIDER_SITE_OTHER): Payer: Medicaid Other

## 2013-10-04 ENCOUNTER — Other Ambulatory Visit: Payer: Self-pay | Admitting: Obstetrics & Gynecology

## 2013-10-04 DIAGNOSIS — O26849 Uterine size-date discrepancy, unspecified trimester: Secondary | ICD-10-CM

## 2013-10-04 DIAGNOSIS — O30009 Twin pregnancy, unspecified number of placenta and unspecified number of amniotic sacs, unspecified trimester: Secondary | ICD-10-CM

## 2013-10-04 DIAGNOSIS — O3680X0 Pregnancy with inconclusive fetal viability, not applicable or unspecified: Secondary | ICD-10-CM

## 2013-10-04 NOTE — Progress Notes (Signed)
U/S-TWIN IUP (Di/Di) noted with +YS and Fetal pole noted x 2, CRL c/w 5+wks x 2  EDD 06/03/2014, cx appears closed (3.6cm), bilateral adnexa appears wnl, unable to pick up FHT with M mode (visualized) [facility]

## 2013-10-11 ENCOUNTER — Other Ambulatory Visit: Payer: Self-pay | Admitting: Obstetrics & Gynecology

## 2013-10-11 DIAGNOSIS — O30009 Twin pregnancy, unspecified number of placenta and unspecified number of amniotic sacs, unspecified trimester: Secondary | ICD-10-CM

## 2013-10-11 DIAGNOSIS — O3680X0 Pregnancy with inconclusive fetal viability, not applicable or unspecified: Secondary | ICD-10-CM

## 2013-10-15 ENCOUNTER — Encounter: Payer: Medicaid Other | Admitting: Women's Health

## 2013-10-15 ENCOUNTER — Other Ambulatory Visit: Payer: Medicaid Other

## 2013-11-11 ENCOUNTER — Ambulatory Visit (INDEPENDENT_AMBULATORY_CARE_PROVIDER_SITE_OTHER): Payer: Medicaid Other | Admitting: Women's Health

## 2013-11-11 ENCOUNTER — Encounter: Payer: Self-pay | Admitting: Women's Health

## 2013-11-11 ENCOUNTER — Other Ambulatory Visit (HOSPITAL_COMMUNITY)
Admission: RE | Admit: 2013-11-11 | Discharge: 2013-11-11 | Disposition: A | Discharge status: Home / Self Care | Payer: Medicaid Other | Source: Ambulatory Visit | Attending: Obstetrics & Gynecology | Admitting: Obstetrics & Gynecology

## 2013-11-11 ENCOUNTER — Telehealth: Payer: Self-pay | Admitting: Obstetrics and Gynecology

## 2013-11-11 VITALS — BP 130/70 | Ht 67.0 in | Wt 245.0 lb

## 2013-11-11 DIAGNOSIS — Z113 Encounter for screening for infections with a predominantly sexual mode of transmission: Secondary | ICD-10-CM

## 2013-11-11 DIAGNOSIS — Z124 Encounter for screening for malignant neoplasm of cervix: Secondary | ICD-10-CM | POA: Insufficient documentation

## 2013-11-11 DIAGNOSIS — Z3009 Encounter for other general counseling and advice on contraception: Secondary | ICD-10-CM

## 2013-11-11 DIAGNOSIS — Z0000 Encounter for general adult medical examination without abnormal findings: Secondary | ICD-10-CM

## 2013-11-11 DIAGNOSIS — Z01419 Encounter for gynecological examination (general) (routine) without abnormal findings: Secondary | ICD-10-CM

## 2013-11-11 NOTE — Telephone Encounter (Signed)
Pt informed will do GC/CHL on urine sample, results back tomorrow.

## 2013-11-11 NOTE — Addendum Note (Signed)
Addended by: Criss AlvinePULLIAM, CHRYSTAL G on: 11/11/2013 11:00 AM   Modules accepted: Orders

## 2013-11-11 NOTE — Progress Notes (Signed)
Patient ID: April LericheKourtney A Paras, female   DOB: 12/26/1990, 23 y.o.   MRN: [id number] Subjective:   April Tyler is a 23 y.o. 836P3 African American female here for a routine well-woman exam.  She is currently 10.6wks by 5.3wk u/s, pregnant w/ di-di twin gestation and plans to terminate. Still wants pap & physical today. Discussed contraception- interested in nexplanon but caused weight gain last time, discussed mirena- would like this.  Patient's last menstrual period was 08/20/2013.    Current complaints: none PCP: none       Does desire labs, but can't do today d/t time   The following portions of the patient's history were reviewed and updated as appropriate: allergies, current medications, past family history, past medical history, past social history, past surgical history and problem list.  Past Medical History Past Medical History  Diagnosis Date  . UTI (lower urinary tract infection)     Past Surgical History History reviewed. No pertinent past surgical history.  Gynecologic History G6P3  Patient's last menstrual period was 08/20/2013. Contraception: currently pregnant Last Pap: unsure. Results were: normal Last mammogram: never. Results were: n/a Last TCS: never  Obstetric History OB History  Gravida Para Term Preterm AB SAB TAB Ectopic Multiple Living  6 3            # Outcome Date GA Lbr Len/2nd Weight Sex Delivery Anes PTL Lv  6 CUR           5 PAR           4 PAR           3 PAR           2 GRA           1 GRA               Current Medications Current Outpatient Prescriptions on File Prior to Visit  Medication Sig Dispense Refill  . cephALEXin (KEFLEX) 500 MG capsule Take 1 capsule (500 mg total) by mouth 3 (three) times daily.  20 capsule  0   No current facility-administered medications on file prior to visit.    Review of Systems Patient denies any headaches, blurred vision, shortness of breath, chest pain, abdominal pain, problems with bowel movements,  urination, or intercourse.  Objective:  BP 130/70  Ht 5\' 7"  (1.702 m)  Wt 245 lb (111.131 kg)  BMI 38.36 kg/m2  LMP 08/20/2013 Physical Exam  General:  Well developed, well nourished, no acute distress. She is alert and oriented x3. Skin:  Warm and dry Neck:  Midline trachea, no thyromegaly or nodules Cardiovascular: Regular rate and rhythm, no murmur heard Lungs:  Effort normal, all lung fields clear to auscultation bilaterally Breasts:  No dominant palpable mass, retraction, or nipple discharge Abdomen:  Soft, non tender, no hepatosplenomegaly or masses Pelvic:  External genitalia is normal in appearance.  The vagina is normal in appearance. The cervix is bulbous, no CMT.  Thin prep pap is done w/ reflex HR HPV cotesting. Uterus is felt to be normal size, shape, and contour.  No adnexal masses or tenderness noted. Extremities:  No swelling or varicosities noted Psych:  She has a normal mood and affect  Assessment:   Healthy well-woman exam 10.6wks w/ di-di twin gestation, plans termination Contraception counseling  Plan:  F/U Friday am per her preference for fasting labs: CBC, CMP, TSH, Lipid panel To call when ready to have Mirena placed, abstinence until insertion Mammogram @25yo  or sooner  if problems Colonoscopy @25yo  or sooner if problems  Marge DuncansBooker, Darely Becknell Randall CNM, [facility]NP-BC 11/11/2013 9:28 AM

## 2013-11-11 NOTE — Patient Instructions (Signed)
Levonorgestrel intrauterine device (IUD)--Mirena What is this medicine? LEVONORGESTREL IUD (LEE voe nor jes trel) is a contraceptive (birth control) device. The device is placed inside the uterus by a healthcare professional. It is used to prevent pregnancy and can also be used to treat heavy bleeding that occurs during your period. Depending on the device, it can be used for 3 to 5 years. This medicine may be used for other purposes; ask your health care provider or pharmacist if you have questions. COMMON BRAND NAME(S): Mirena, Skyla What should I tell my health care provider before I take this medicine? They need to know if you have any of these conditions: -abnormal Pap smear -cancer of the breast, uterus, or cervix -diabetes -endometritis -genital or pelvic infection now or in the past -have more than one sexual partner or your partner has more than one partner -heart disease -history of an ectopic or tubal pregnancy -immune system problems -IUD in place -liver disease or tumor -problems with blood clots or take blood-thinners -use intravenous drugs -uterus of unusual shape -vaginal bleeding that has not been explained -an unusual or allergic reaction to levonorgestrel, other hormones, silicone, or polyethylene, medicines, foods, dyes, or preservatives -pregnant or trying to get pregnant -breast-feeding How should I use this medicine? This device is placed inside the uterus by a health care professional. Talk to your pediatrician regarding the use of this medicine in children. Special care may be needed. Overdosage: If you think you have taken too much of this medicine contact a poison control center or emergency room at once. NOTE: This medicine is only for you. Do not share this medicine with others. What if I miss a dose? This does not apply. What may interact with this medicine? Do not take this medicine with any of the following  medications: -amprenavir -bosentan -fosamprenavir This medicine may also interact with the following medications: -aprepitant -barbiturate medicines for inducing sleep or treating seizures -bexarotene -griseofulvin -medicines to treat seizures like carbamazepine, ethotoin, felbamate, oxcarbazepine, phenytoin, topiramate -modafinil -pioglitazone -rifabutin -rifampin -rifapentine -some medicines to treat HIV infection like atazanavir, indinavir, lopinavir, nelfinavir, tipranavir, ritonavir -St. John's wort -warfarin This list may not describe all possible interactions. Give your health care provider a list of all the medicines, herbs, non-prescription drugs, or dietary supplements you use. Also tell them if you smoke, drink alcohol, or use illegal drugs. Some items may interact with your medicine. What should I watch for while using this medicine? Visit your doctor or health care professional for regular check ups. See your doctor if you or your partner has sexual contact with others, becomes HIV positive, or gets a sexual transmitted disease. This product does not protect you against HIV infection (AIDS) or other sexually transmitted diseases. You can check the placement of the IUD yourself by reaching up to the top of your vagina with clean fingers to feel the threads. Do not pull on the threads. It is a good habit to check placement after each menstrual period. Call your doctor right away if you feel more of the IUD than just the threads or if you cannot feel the threads at all. The IUD may come out by itself. You may become pregnant if the device comes out. If you notice that the IUD has come out use a backup birth control method like condoms and call your health care provider. Using tampons will not change the position of the IUD and are okay to use during your period. What side effects may I   I notice from receiving this medicine? Side effects that you should report to your doctor or  health care professional as soon as possible: -allergic reactions like skin rash, itching or hives, swelling of the face, lips, or tongue -fever, flu-like symptoms -genital sores -high blood pressure -no menstrual period for 6 weeks during use -pain, swelling, warmth in the leg -pelvic pain or tenderness -severe or sudden headache -signs of pregnancy -stomach cramping -sudden shortness of breath -trouble with balance, talking, or walking -unusual vaginal bleeding, discharge -yellowing of the eyes or skin Side effects that usually do not require medical attention (report to your doctor or health care professional if they continue or are bothersome): -acne -breast pain -change in sex drive or performance -changes in weight -cramping, dizziness, or faintness while the device is being inserted -headache -irregular menstrual bleeding within first 3 to 6 months of use -nausea This list may not describe all possible side effects. Call your doctor for medical advice about side effects. You may report side effects to FDA at 1-800-FDA-1088. Where should I keep my medicine? This does not apply. NOTE: This sheet is a summary. It may not cover all possible information. If you have questions about this medicine, talk to your doctor, pharmacist, or health care provider.  2015, Elsevier/Gold Standard. (2011-06-09 13:54:04)  

## 2013-11-12 ENCOUNTER — Telehealth: Payer: Self-pay | Admitting: *Deleted

## 2013-11-12 LAB — CYTOLOGY - PAP

## 2013-11-12 LAB — GC/CHLAMYDIA PROBE AMP
CT Probe RNA: NEGATIVE
GC Probe RNA: NEGATIVE

## 2013-11-12 NOTE — Telephone Encounter (Signed)
Pt informed of negative GC/CHL results from 11/11/2013. Pt states wants to have another pap done and add GC/CHL. Informed pt insurance would not cover another pap. Pt states would like a repeat on GC/CHL with another test than urine. Informed pt will route message to [name], CNM.

## 2013-11-12 NOTE — Telephone Encounter (Signed)
Pt had called back after pap/physical visit wanting to be checked for GC/CT, pap had already left office, so they added it to urine. Results were neg, but pt is not satisfied, wants on pap or cervical swab. Chrystal called lab and had it added to pap.  Cheral MarkerKimberly R. Booker, CNM, [facility]NP-BC 11/12/2013 5:14 PM

## 2013-11-15 ENCOUNTER — Other Ambulatory Visit: Payer: Medicaid Other

## 2013-11-15 ENCOUNTER — Telehealth: Payer: Self-pay | Admitting: *Deleted

## 2013-11-15 NOTE — Telephone Encounter (Signed)
Pt aware of results 

## 2013-11-19 ENCOUNTER — Other Ambulatory Visit: Payer: Medicaid Other

## 2013-11-29 ENCOUNTER — Ambulatory Visit (INDEPENDENT_AMBULATORY_CARE_PROVIDER_SITE_OTHER): Payer: Medicaid Other | Admitting: Adult Health

## 2013-11-29 ENCOUNTER — Encounter: Payer: Self-pay | Admitting: Adult Health

## 2013-11-29 VITALS — BP 120/80 | Ht 69.0 in | Wt 241.0 lb

## 2013-11-29 DIAGNOSIS — Z8742 Personal history of other diseases of the female genital tract: Secondary | ICD-10-CM

## 2013-11-29 DIAGNOSIS — N898 Other specified noninflammatory disorders of vagina: Secondary | ICD-10-CM

## 2013-11-29 DIAGNOSIS — Z9889 Other specified postprocedural states: Secondary | ICD-10-CM

## 2013-11-29 DIAGNOSIS — Z3041 Encounter for surveillance of contraceptive pills: Secondary | ICD-10-CM

## 2013-11-29 DIAGNOSIS — Z309 Encounter for contraceptive management, unspecified: Secondary | ICD-10-CM

## 2013-11-29 HISTORY — DX: Other specified noninflammatory disorders of vagina: N89.8

## 2013-11-29 HISTORY — DX: Other specified postprocedural states: Z98.890

## 2013-11-29 HISTORY — DX: Encounter for contraceptive management, unspecified: Z30.9

## 2013-11-29 LAB — POCT URINE PREGNANCY: Preg Test, Ur: POSITIVE

## 2013-11-29 LAB — POCT WET PREP (WET MOUNT)
Trichomonas Wet Prep HPF POC: NEGATIVE
WBC, Wet Prep HPF POC: POSITIVE

## 2013-11-29 MED ORDER — NORETHIN ACE-ETH ESTRAD-FE 1-20 MG-MCG PO TABS: 1.0000 | ORAL_TABLET | Freq: Every day | ORAL | Status: DC

## 2013-11-29 NOTE — Progress Notes (Signed)
Subjective:     Patient ID: April LericheKourtney A Tyler, female   DOB: 09/17/1990, 23 y.o.   MRN: [id number]  HPI [name] is a 23 year old black female, [place]Is [place] OCs and wants to continue those, will still think about IUD, as was her choice before procedure.She complains of discharge with some itching, no odor.Has not had sex since procedure.  Review of Systems See HPI Reviewed past medical,surgical, social and family history. Reviewed medications and allergies.     Objective:   Physical Exam BP 120/80  Ht 5\' 9"  (1.753 m)  Wt 241 lb (109.317 kg)  BMI 35.57 kg/m2  LMP 08/20/2013  Breastfeeding? UnknownUPT lightly positive, Skin warm and dry.Pelvic: external genitalia is normal in appearance, vagina: tan to pink discharge without odor, cervix:smooth and bulbous, uterus: normal size, shape and contour, non tender, no masses felt, adnexa: no masses or tenderness noted. Wet prep: +WBCs. GC/CHL obtained.     Assessment:     Vaginal discharge Sp elective abortion Contraceptive management    Plan:     Refilled loestrin x 1 year Use condoms GC/CHL sent, will talk next week when results back Follow up prn

## 2013-11-29 NOTE — Patient Instructions (Signed)
Continue pill Use condoms Will talk when GC/CHL back

## 2013-11-30 LAB — GC/CHLAMYDIA PROBE AMP
CT Probe RNA: NEGATIVE
GC Probe RNA: NEGATIVE

## 2013-12-02 ENCOUNTER — Telehealth: Payer: Self-pay | Admitting: Adult Health

## 2013-12-02 NOTE — Telephone Encounter (Signed)
Left message x 1. JSY 

## 2013-12-02 NOTE — Telephone Encounter (Signed)
Left message test negative 

## 2013-12-03 MED ORDER — AZITHROMYCIN 500 MG PO TABS: ORAL_TABLET | ORAL | Status: DC

## 2013-12-03 NOTE — Telephone Encounter (Signed)
Will treat with azithromycin 500 mg #2 po now

## 2013-12-03 NOTE — Telephone Encounter (Signed)
Left message x 2. JSY 

## 2013-12-03 NOTE — Telephone Encounter (Signed)
Spoke with pt letting her know GC/CHL was negative. Pt states her and her boyfriend had sex Saturday and he was diagnosed with chlamydia on Monday. Pt is requesting that you go ahead and treat her because the condom broke. Please advise. Thanks!!!

## 2014-01-31 ENCOUNTER — Encounter: Payer: Self-pay | Admitting: Adult Health

## 2014-01-31 ENCOUNTER — Ambulatory Visit (INDEPENDENT_AMBULATORY_CARE_PROVIDER_SITE_OTHER): Payer: Medicaid Other | Admitting: Adult Health

## 2014-01-31 ENCOUNTER — Telehealth: Payer: Self-pay | Admitting: Adult Health

## 2014-01-31 VITALS — BP 116/70 | Ht 69.0 in | Wt 235.0 lb

## 2014-01-31 DIAGNOSIS — N760 Acute vaginitis: Secondary | ICD-10-CM | POA: Insufficient documentation

## 2014-01-31 DIAGNOSIS — A499 Bacterial infection, unspecified: Secondary | ICD-10-CM

## 2014-01-31 DIAGNOSIS — B9689 Other specified bacterial agents as the cause of diseases classified elsewhere: Secondary | ICD-10-CM | POA: Insufficient documentation

## 2014-01-31 DIAGNOSIS — N898 Other specified noninflammatory disorders of vagina: Secondary | ICD-10-CM

## 2014-01-31 DIAGNOSIS — R300 Dysuria: Secondary | ICD-10-CM | POA: Insufficient documentation

## 2014-01-31 DIAGNOSIS — N390 Urinary tract infection, site not specified: Secondary | ICD-10-CM | POA: Insufficient documentation

## 2014-01-31 HISTORY — DX: Dysuria: R30.0

## 2014-01-31 HISTORY — DX: Other specified bacterial agents as the cause of diseases classified elsewhere: B96.89

## 2014-01-31 HISTORY — DX: Other specified bacterial agents as the cause of diseases classified elsewhere: N76.0

## 2014-01-31 LAB — POCT URINALYSIS DIPSTICK
Glucose, UA: NEGATIVE
Nitrite, UA: NEGATIVE

## 2014-01-31 LAB — POCT WET PREP (WET MOUNT): WBC, Wet Prep HPF POC: POSITIVE

## 2014-01-31 MED ORDER — PHENAZOPYRIDINE HCL 200 MG PO TABS: 200.0000 mg | ORAL_TABLET | Freq: Three times a day (TID) | ORAL | Status: DC | PRN

## 2014-01-31 MED ORDER — NITROFURANTOIN MONOHYD MACRO 100 MG PO CAPS: 100.0000 mg | ORAL_CAPSULE | Freq: Two times a day (BID) | ORAL | Status: DC

## 2014-01-31 MED ORDER — METRONIDAZOLE 500 MG PO TABS: 500.0000 mg | ORAL_TABLET | Freq: Two times a day (BID) | ORAL | Status: DC

## 2014-01-31 NOTE — Patient Instructions (Signed)
Urinary Tract Infection Urinary tract infections (UTIs) can develop anywhere along your urinary tract. Your urinary tract is your body's drainage system for removing wastes and extra water. Your urinary tract includes two kidneys, two ureters, a bladder, and a urethra. Your kidneys are a pair of bean-shaped organs. Each kidney is about the size of your fist. They are located below your ribs, one on each side of your spine. CAUSES Infections are caused by microbes, which are microscopic organisms, including fungi, viruses, and bacteria. These organisms are so small that they can only be seen through a microscope. Bacteria are the microbes that most commonly cause UTIs. SYMPTOMS  Symptoms of UTIs may vary by age and gender of the patient and by the location of the infection. Symptoms in young women typically include a frequent and intense urge to urinate and a painful, burning feeling in the bladder or urethra during urination. Older women and men are more likely to be tired, shaky, and weak and have muscle aches and abdominal pain. A fever may mean the infection is in your kidneys. Other symptoms of a kidney infection include pain in your back or sides below the ribs, nausea, and vomiting. DIAGNOSIS To diagnose a UTI, your caregiver will ask you about your symptoms. Your caregiver also will ask to provide a urine sample. The urine sample will be tested for bacteria and white blood cells. White blood cells are made by your body to help fight infection. TREATMENT  Typically, UTIs can be treated with medication. Because most UTIs are caused by a bacterial infection, they usually can be treated with the use of antibiotics. The choice of antibiotic and length of treatment depend on your symptoms and the type of bacteria causing your infection. HOME CARE INSTRUCTIONS  If you were prescribed antibiotics, take them exactly as your caregiver instructs you. Finish the medication even if you feel better after you  have only taken some of the medication.  Drink enough water and fluids to keep your urine clear or pale yellow.  Avoid caffeine, tea, and carbonated beverages. They tend to irritate your bladder.  Empty your bladder often. Avoid holding urine for long periods of time.  Empty your bladder before and after sexual intercourse.  After a bowel movement, women should cleanse from front to back. Use each tissue only once. SEEK MEDICAL CARE IF:   You have back pain.  You develop a fever.  Your symptoms do not begin to resolve within 3 days. SEEK IMMEDIATE MEDICAL CARE IF:   You have severe back pain or lower abdominal pain.  You develop chills.  You have nausea or vomiting.  You have continued burning or discomfort with urination. MAKE SURE YOU:   Understand these instructions.  Will watch your condition.  Will get help right away if you are not doing well or get worse. Document Released: 02/16/2005 Document Revised: 11/08/2011 Document Reviewed: 06/17/2011 [facility] Patient Information 2015 [place], [place]. This information is not intended to replace advice given to you by your health care provider. Make sure you discuss any questions you have with your health care provider. Bacterial Vaginosis Bacterial vaginosis is a vaginal infection that occurs when the normal balance of bacteria in the vagina is disrupted. It results from an overgrowth of certain bacteria. This is the most common vaginal infection in women of childbearing age. Treatment is important to prevent complications, especially in pregnant women, as it can cause a premature delivery. CAUSES  Bacterial vaginosis is caused by an increase  in harmful bacteria that are normally present in smaller amounts in the vagina. Several different kinds of bacteria can cause bacterial vaginosis. However, the reason that the condition develops is not fully understood. RISK FACTORS Certain activities or behaviors can put you at an  increased risk of developing bacterial vaginosis, including:  Having a new sex partner or multiple sex partners.  Douching.  Using an intrauterine device (IUD) for contraception. Women do not get bacterial vaginosis from toilet seats, bedding, swimming pools, or contact with objects around them. SIGNS AND SYMPTOMS  Some women with bacterial vaginosis have no signs or symptoms. Common symptoms include:  Grey vaginal discharge.  A fishlike odor with discharge, especially after sexual intercourse.  Itching or burning of the vagina and vulva.  Burning or pain with urination. DIAGNOSIS  Your health care provider will take a medical history and examine the vagina for signs of bacterial vaginosis. A sample of vaginal fluid may be taken. Your health care provider will look at this sample under a microscope to check for bacteria and abnormal cells. A vaginal pH test may also be done.  TREATMENT  Bacterial vaginosis may be treated with antibiotic medicines. These may be given in the form of a pill or a vaginal cream. A second round of antibiotics may be prescribed if the condition comes back after treatment.  HOME CARE INSTRUCTIONS   Only take over-the-counter or prescription medicines as directed by your health care provider.  If antibiotic medicine was prescribed, take it as directed. Make sure you finish it even if you start to feel better.  Do not have sex until treatment is completed.  Tell all sexual partners that you have a vaginal infection. They should see their health care provider and be treated if they have problems, such as a mild rash or itching.  Practice safe sex by using condoms and only having one sex partner. SEEK MEDICAL CARE IF:   Your symptoms are not improving after 3 days of treatment.  You have increased discharge or pain.  You have a fever. MAKE SURE YOU:   Understand these instructions.  Will watch your condition.  Will get help right away if you are not  doing well or get worse. FOR MORE INFORMATION  Centers for Disease Control and Prevention, Division of STD Prevention: [employer] American Sexual Health Association (ASHA): www.ashastd.org  Document Released: 05/09/2005 Document Revised: 02/27/2013 Document Reviewed: 12/19/2012 [facility] Patient Information 2015 [place], [place]. This information is not intended to replace advice given to you by your health care provider. Make sure you discuss any questions you have with your health care provider. push fluids No sex Take meds No alcohol

## 2014-01-31 NOTE — Progress Notes (Signed)
Subjective:     Patient ID: April Tyler, female   DOB: 05/05/1991, 23 y.o.   MRN: [id number]  HPI [name] is a 23 year old black female, worked in for pain with urination and discharge x 3 days , some nausea.No fever.  Review of Systems See HPI Reviewed past medical,surgical, social and family history. Reviewed medications and allergies.     Objective:   Physical Exam BP 116/70  Ht  (1.753 m)  Wt 235 lb (106.595 kg)  BMI 34.69 kg/m2  LMP 01/20/2014  Breastfeeding? [name] 2+leuks,trace protein and blood 3+ketones Skin warm and dry.Pelvic: external genitalia is normal in appearance, vagina: white discharge with odor, cervix:smooth and bulbous,negative CMT, uterus: normal size, shape and contour, non tender, no masses felt, adnexa: no masses or tenderness noted. Wet prep: + for clue cells and +WBCs. GC/CHL obtained.    Declines meds for nausea.  Assessment:     Burning with urination Vaginal discharge BV UTI    Plan:     UA C&S sent along with GC/CHL Rx flagyl 500 mg 1 bid x 7 days, no alcohol, review handout on BV and UTI   Rx macrobid 1 bid x 7 days #14 no refills Rx pyridium 200 mg 1 tid #10 no refills Follow up prn Push fluids

## 2014-01-31 NOTE — Telephone Encounter (Signed)
Spoke with pt. Pt is having pain with urination, cramping after urinating. Pt wonders if she has PID. Advised would need to be seen. Pt voiced understanding. Call transferred to front desk for appt. JSY

## 2014-02-01 LAB — URINALYSIS
Bilirubin Urine: NEGATIVE
Glucose, UA: NEGATIVE mg/dL
Ketones, ur: 15 mg/dL — AB
Nitrite: NEGATIVE
Protein, ur: NEGATIVE mg/dL
Specific Gravity, Urine: 1.023 (ref 1.005–1.030)
Urobilinogen, UA: 1 mg/dL (ref 0.0–1.0)
pH: 6.5 (ref 5.0–8.0)

## 2014-02-01 LAB — GC/CHLAMYDIA PROBE AMP
CT Probe RNA: NEGATIVE
GC Probe RNA: NEGATIVE

## 2014-02-04 LAB — URINE CULTURE: Colony Count: 10000

## 2014-03-24 ENCOUNTER — Encounter: Payer: Self-pay | Admitting: Adult Health

## 2014-04-21 ENCOUNTER — Telehealth: Payer: Self-pay | Admitting: Adult Health

## 2014-04-21 MED ORDER — VALACYCLOVIR HCL 1 G PO TABS: 1000.0000 mg | ORAL_TABLET | Freq: Every day | ORAL | Status: DC

## 2014-04-21 NOTE — Telephone Encounter (Signed)
Wants Valtrex called in for suppression, will refill x 1 year

## 2014-04-21 NOTE — Addendum Note (Signed)
Addended by: Cyril MourningGRIFFIN, JENNIFER A on: 04/21/2014 03:39 PM   Modules accepted: Orders

## 2014-04-21 NOTE — Telephone Encounter (Signed)
Left message I called 

## 2014-09-01 ENCOUNTER — Ambulatory Visit: Payer: Medicaid Other | Admitting: Obstetrics & Gynecology

## 2014-09-02 ENCOUNTER — Ambulatory Visit: Payer: Medicaid Other | Admitting: Advanced Practice Midwife

## 2014-09-09 ENCOUNTER — Ambulatory Visit: Payer: Medicaid Other | Admitting: Women's Health

## 2014-09-12 ENCOUNTER — Ambulatory Visit: Payer: Medicaid Other | Admitting: Obstetrics & Gynecology

## 2014-09-15 ENCOUNTER — Ambulatory Visit: Payer: Medicaid Other | Admitting: Obstetrics & Gynecology

## 2014-09-15 ENCOUNTER — Encounter: Payer: Self-pay | Admitting: Obstetrics & Gynecology

## 2014-09-24 ENCOUNTER — Encounter: Payer: Self-pay | Admitting: Adult Health

## 2014-09-24 ENCOUNTER — Ambulatory Visit: Payer: Medicaid Other | Admitting: Adult Health

## 2014-10-21 ENCOUNTER — Telehealth: Payer: Self-pay | Admitting: Adult Health

## 2015-02-10 ENCOUNTER — Encounter: Payer: Self-pay | Admitting: Advanced Practice Midwife

## 2015-02-10 ENCOUNTER — Other Ambulatory Visit: Payer: Medicaid Other | Admitting: Advanced Practice Midwife

## 2015-03-11 ENCOUNTER — Telehealth: Payer: Self-pay | Admitting: Adult Health

## 2015-03-12 NOTE — Telephone Encounter (Signed)
Pt called stating that she needs to be treated for Chlamydia, [facility]. Pt wanted to know if Victorino DikeJennifer would call in the Rx for her.

## 2015-03-12 NOTE — Telephone Encounter (Signed)
Boyfriend had +CHL to come tomorrow at 1 pm for physical has family planning medicaid, will treat then

## 2015-03-13 ENCOUNTER — Encounter: Payer: Self-pay | Admitting: Adult Health

## 2015-03-13 ENCOUNTER — Ambulatory Visit (INDEPENDENT_AMBULATORY_CARE_PROVIDER_SITE_OTHER): Payer: Medicaid Other | Admitting: Adult Health

## 2015-03-13 VITALS — BP 120/60 | HR 78 | Ht 68.0 in | Wt 240.0 lb

## 2015-03-13 DIAGNOSIS — Z01419 Encounter for gynecological examination (general) (routine) without abnormal findings: Secondary | ICD-10-CM

## 2015-03-13 DIAGNOSIS — Z30011 Encounter for initial prescription of contraceptive pills: Secondary | ICD-10-CM

## 2015-03-13 DIAGNOSIS — Z309 Encounter for contraceptive management, unspecified: Secondary | ICD-10-CM

## 2015-03-13 DIAGNOSIS — Z9889 Other specified postprocedural states: Secondary | ICD-10-CM

## 2015-03-13 DIAGNOSIS — Z202 Contact with and (suspected) exposure to infections with a predominantly sexual mode of transmission: Secondary | ICD-10-CM | POA: Insufficient documentation

## 2015-03-13 DIAGNOSIS — Z3202 Encounter for pregnancy test, result negative: Secondary | ICD-10-CM

## 2015-03-13 HISTORY — DX: Contact with and (suspected) exposure to infections with a predominantly sexual mode of transmission: Z20.2

## 2015-03-13 LAB — POCT URINE PREGNANCY: Preg Test, Ur: NEGATIVE

## 2015-03-13 MED ORDER — AZITHROMYCIN 500 MG PO TABS: ORAL_TABLET | ORAL | Status: DC

## 2015-03-13 MED ORDER — NORETHIN-ETH ESTRAD-FE BIPHAS 1 MG-10 MCG / 10 MCG PO TABS: 1.0000 | ORAL_TABLET | Freq: Every day | ORAL | Status: DC

## 2015-03-13 NOTE — Patient Instructions (Signed)
Start OCs today  Pap and physical in 1year Use condoms

## 2015-03-13 NOTE — Progress Notes (Signed)
Patient ID: April Tyler, female   DOB: 01/31/1991, 24 y.o.   MRN: [id number] History of Present Illness: April Tyler is a 24 year old black female in for well woman gyn exam, she had normal pap 11/11/13.She says boyfriend treated for chlamydia and she needs to be treated,she[place] at 15 weeks with twins,has been bleeding since, no pain.She is requesting birth control.   Current Medications, Allergies, Past Medical History, Past Surgical History, Family History and Social History were reviewed in [facility] electronic medical record.     Review of Systems: Patient denies any headaches, hearing loss, fatigue, blurred vision, shortness of breath, chest pain, abdominal pain, problems with bowel movements, urination, or intercourse. No joint pain or mood swings.See HPI for positives.    Physical Exam:BP 120/60 mmHg  Pulse 78  Ht 5\' 8"  (1.727 m)  Wt 240 lb (108.863 kg)  BMI 36.50 kg/m2  LMP 02/13/2015 UPT negative General:  Well developed, well nourished, no acute distress Skin:  Warm and dry Neck:  Midline trachea, normal thyroid, good ROM, no lymphadenopathy Lungs; Clear to auscultation bilaterally Breast:  No dominant palpable mass, retraction, or nipple discharge Cardiovascular: Regular rate and rhythm Abdomen:  Soft, non tender, no hepatosplenomegaly Pelvic:  External genitalia is normal in appearance, no lesions.  The vagina is normal in appearance,period like blood. Urethra has no lesions or masses. The cervix is bulbous.  Uterus is felt to be normal size, shape, and contour.  No adnexal masses or tenderness noted.Bladder is non tender, no masses felt. Extremities/musculoskeletal:  No swelling or varicosities noted, no clubbing or cyanosis Psych:  No mood changes, alert and cooperative,seems happy   Impression: Well woman gyn exam no pap, has family planning medicaid Chlamydia exposure Contraceptive management History of elective  abortion    Plan: Check HIV and RPR Thursday 10/27  GC/CHL sent on urine Rx Azithromycin 500 mg #2 2 po now Rx lo loestrin disp 1 pack take 1 daily with 11 refills, start today Use condoms

## 2015-03-15 LAB — GC/CHLAMYDIA PROBE AMP
Chlamydia trachomatis, NAA: NEGATIVE
Neisseria gonorrhoeae by PCR: NEGATIVE

## 2015-06-16 ENCOUNTER — Telehealth: Payer: Self-pay | Admitting: Adult Health

## 2015-06-16 NOTE — Telephone Encounter (Signed)
Left message to call me back.

## 2015-07-22 ENCOUNTER — Encounter: Payer: Self-pay | Admitting: *Deleted

## 2015-07-22 ENCOUNTER — Ambulatory Visit: Payer: Medicaid Other | Admitting: Advanced Practice Midwife

## 2015-07-23 ENCOUNTER — Ambulatory Visit: Payer: Medicaid Other | Admitting: Advanced Practice Midwife

## 2015-07-29 ENCOUNTER — Ambulatory Visit: Payer: BLUE CROSS/BLUE SHIELD | Admitting: Adult Health

## 2015-07-29 ENCOUNTER — Encounter: Payer: Self-pay | Admitting: Adult Health

## 2015-08-21 DIAGNOSIS — IMO0001 Reserved for inherently not codable concepts without codable children: Secondary | ICD-10-CM

## 2015-08-21 DIAGNOSIS — Z332 Encounter for elective termination of pregnancy: Secondary | ICD-10-CM

## 2015-08-21 HISTORY — PX: OTHER SURGICAL HISTORY: SHX169

## 2015-08-21 HISTORY — DX: Reserved for inherently not codable concepts without codable children: IMO0001

## 2015-08-21 HISTORY — DX: Encounter for elective termination of pregnancy: Z33.2

## 2015-09-03 ENCOUNTER — Ambulatory Visit: Payer: BLUE CROSS/BLUE SHIELD | Admitting: Adult Health

## 2015-09-03 ENCOUNTER — Encounter: Payer: Self-pay | Admitting: Adult Health

## 2015-09-10 ENCOUNTER — Telehealth: Payer: Self-pay | Admitting: Adult Health

## 2015-09-10 NOTE — Telephone Encounter (Signed)
Has ?exposure to STD to come in am a 9:45 to be checked

## 2015-09-11 ENCOUNTER — Ambulatory Visit (INDEPENDENT_AMBULATORY_CARE_PROVIDER_SITE_OTHER): Payer: BLUE CROSS/BLUE SHIELD | Admitting: Adult Health

## 2015-09-11 ENCOUNTER — Encounter: Payer: Self-pay | Admitting: Adult Health

## 2015-09-11 VITALS — BP 130/80 | HR 78 | Ht 69.0 in | Wt 241.0 lb

## 2015-09-11 DIAGNOSIS — Z113 Encounter for screening for infections with a predominantly sexual mode of transmission: Secondary | ICD-10-CM | POA: Diagnosis not present

## 2015-09-11 DIAGNOSIS — Z308 Encounter for other contraceptive management: Secondary | ICD-10-CM

## 2015-09-11 HISTORY — DX: Encounter for screening for infections with a predominantly sexual mode of transmission: Z11.3

## 2015-09-11 NOTE — Patient Instructions (Signed)
No sex Come in in 3 weeks for stat QHCG in am and insertion in pm with me

## 2015-09-11 NOTE — Progress Notes (Signed)
Subjective:     Patient ID: April Tyler, female   DOB: 04/22/1991, 25 y.o.   MRN: [id number]  HPI April Tyler is a 25 year old black female in requesting STD testing but declines blood tests, had elective abortion 08/21/15 and is on OCs abut wants to get nexplanon.   Review of Systems Patient denies any headaches, hearing loss, fatigue, blurred vision, shortness of breath, chest pain, abdominal pain, problems with bowel movements, urination, or intercourse. No joint pain or mood swings. Reviewed past medical,surgical, social and family history. Reviewed medications and allergies.     Objective:   Physical Exam BP 130/80 mmHg  Pulse 78  Ht 5\' 9"  (1.753 m)  Wt 241 lb (109.317 kg)  BMI 35.57 kg/m2 Skin warm and dry.Pelvic: external genitalia is normal in appearance no lesions, vagina: period blood with no odor,urethra has no lesions or masses noted, cervix:smooth and bulbous,non tender, uterus: normal size, shape and contour, non tender, no masses felt, adnexa: no masses or tenderness noted. Bladder is non tender and no masses felt.  GC/CHL sent on urine.    Assessment:     STD screening Contraceptive management    Plan:     GC/CHL sent on urine Order nexplanon, no sex come back in 3 weeks for stat QHCG in am and nexplanon insertion in pm  Continue OCs

## 2015-09-14 LAB — GC/CHLAMYDIA PROBE AMP
Chlamydia trachomatis, NAA: NEGATIVE
Neisseria gonorrhoeae by PCR: NEGATIVE

## 2015-09-16 ENCOUNTER — Ambulatory Visit: Payer: BLUE CROSS/BLUE SHIELD | Admitting: Adult Health

## 2015-09-23 ENCOUNTER — Encounter: Payer: BLUE CROSS/BLUE SHIELD | Admitting: Adult Health

## 2015-09-23 ENCOUNTER — Other Ambulatory Visit: Payer: BLUE CROSS/BLUE SHIELD

## 2015-10-01 ENCOUNTER — Other Ambulatory Visit: Payer: BLUE CROSS/BLUE SHIELD

## 2015-10-01 ENCOUNTER — Encounter: Payer: Self-pay | Admitting: *Deleted

## 2015-10-01 ENCOUNTER — Encounter: Payer: BLUE CROSS/BLUE SHIELD | Admitting: Adult Health

## 2015-12-21 ENCOUNTER — Ambulatory Visit (INDEPENDENT_AMBULATORY_CARE_PROVIDER_SITE_OTHER): Payer: BLUE CROSS/BLUE SHIELD | Admitting: Women's Health

## 2015-12-21 ENCOUNTER — Encounter: Payer: Self-pay | Admitting: Women's Health

## 2015-12-21 VITALS — BP 112/62 | HR 92 | Wt 244.0 lb

## 2015-12-21 DIAGNOSIS — Z3201 Encounter for pregnancy test, result positive: Secondary | ICD-10-CM

## 2015-12-21 DIAGNOSIS — N898 Other specified noninflammatory disorders of vagina: Secondary | ICD-10-CM

## 2015-12-21 DIAGNOSIS — A599 Trichomoniasis, unspecified: Secondary | ICD-10-CM | POA: Diagnosis not present

## 2015-12-21 DIAGNOSIS — O200 Threatened abortion: Secondary | ICD-10-CM

## 2015-12-21 LAB — POCT WET PREP (WET MOUNT)
Clue Cells Wet Prep Whiff POC: POSITIVE
Trichomonas Wet Prep HPF POC: POSITIVE

## 2015-12-21 LAB — POCT URINE PREGNANCY: Preg Test, Ur: POSITIVE — AB

## 2015-12-21 MED ORDER — METRONIDAZOLE 500 MG PO TABS: 500.0000 mg | ORAL_TABLET | Freq: Two times a day (BID) | ORAL | 0 refills | Status: DC

## 2015-12-21 NOTE — Progress Notes (Signed)
   [facility] Visit  Patient name: April Tyler MRN [id number]  Date of birth: May 28, 1990  CC & HPI:  April Tyler is a 25 y.o.  African American female presenting today for report of +HPT 2-3wks ago, then on 7/21 began to have heavy bleeding w/ clots and cramping that lasted 7-8d and stopped. Was having some nausea, still having some, but not as bad as it was. LMP 11/08/15. Was not trying to get pregnant, however she was not using any form of contraception. She had planned on coming this past April for Nexplanon, but insurance changed and she was unable to. If this was a miscarriage, she does want to still get Nexplanon. She has also been having some vulvar itching for past few days. Last sex 7/30, did use condom.  Patient's last menstrual period was 12/07/2015. Last pap 11/11/13, neg  Pertinent History Reviewed:  Medical & Surgical Hx:   Past medical, surgical, family, and social history reviewed in electronic medical record Medications: Reviewed & Updated - see associated section Allergies: Reviewed in electronic medical record  Objective Findings:  Vitals: BP 112/62 (BP Location: Right Arm, Patient Position: Sitting, Cuff Size: Large)   Pulse 92   Wt 244 lb (110.7 kg)   LMP 12/07/2015   BMI 36.03 kg/m  Body mass index is 36.03 kg/m.  Physical Examination: General appearance - alert, well appearing, and in no distress Pelvic - cx visually closed, anterior surface appears bruised- large reddish/purple area- not really strawberry-appearance as it is a solid area, states she has had rough sex lately  Results for orders placed or performed in visit on 12/21/15 (from the past 24 hour(s))  POCT urine pregnancy   Collection Time: 12/21/15  3:16 PM  Result Value Ref Range   Preg Test, Ur Positive (A) Negative  POCT Wet Prep Mellody Drown [place])   Collection Time: 12/21/15  3:28 PM  Result Value Ref Range   Source Wet Prep POC vaginal    WBC, Wet Prep HPF POC few    Bacteria Wet  Prep HPF POC None None, Few, Too numerous to count   BACTERIA WET PREP MORPHOLOGY POC     Clue Cells Wet Prep HPF POC Many (A) None, Too numerous to count   Clue Cells Wet Prep Whiff POC Positive Whiff    Yeast Wet Prep HPF POC None    KOH Wet Prep POC     Trichomonas Wet Prep HPF POC pos      Assessment & Plan:  A:   Possible miscarriage vs. Threatened miscarriage  Trichomonas  BV  P:  Rx metronidazole 500mg  BID x 7d for Trich and BV, no sex or etoh  Rx metronidazole 500mg  BID x 7d for partner April Tyler, dob 01/[birth year]85, nkda called into [facility] for Trich partner tx  No sex until at least 7d from time both have finished med  BHCG, CBC, Group & Type today, GC/CT from urine  I will call pt w/ results and poc  April Tyler CNM, [facility] 12/21/2015 3:32 PM

## 2015-12-22 ENCOUNTER — Telehealth: Payer: Self-pay | Admitting: Women's Health

## 2015-12-22 DIAGNOSIS — O039 Complete or unspecified spontaneous abortion without complication: Secondary | ICD-10-CM

## 2015-12-22 HISTORY — DX: Complete or unspecified spontaneous abortion without complication: O03.9

## 2015-12-22 LAB — CBC
Hematocrit: 38.8 % (ref 34.0–46.6)
Hemoglobin: 12.2 g/dL (ref 11.1–15.9)
MCH: 25.3 pg — ABNORMAL LOW (ref 26.6–33.0)
MCHC: 31.4 g/dL — ABNORMAL LOW (ref 31.5–35.7)
MCV: 81 fL (ref 79–97)
Platelets: 335 10*3/uL (ref 150–379)
RBC: 4.82 x10E6/uL (ref 3.77–5.28)
RDW: 16.5 % — ABNORMAL HIGH (ref 12.3–15.4)
WBC: 8 10*3/uL (ref 3.4–10.8)

## 2015-12-22 LAB — ABO AND RH: Rh Factor: POSITIVE

## 2015-12-22 LAB — BETA HCG QUANT (REF LAB): hCG Quant: 26 m[IU]/mL

## 2015-12-22 NOTE — Telephone Encounter (Signed)
Notified pt of lab results, beta HCG 26, likely was SAB. To come in tomorrow to repeat to make sure continuing to drop. Wants Nexplanon. Last sex 7/30. To come in 8/9 AM for BHCG, then pm for insertion. No sex until after Nexplanon in. Verbalized understanding, switched to front to make appts.  Cheral Marker, CNM, WHNP-BC 12/22/2015 1:50 PM

## 2015-12-23 ENCOUNTER — Other Ambulatory Visit: Payer: BLUE CROSS/BLUE SHIELD

## 2015-12-23 LAB — GC/CHLAMYDIA PROBE AMP
Chlamydia trachomatis, NAA: NEGATIVE
Neisseria gonorrhoeae by PCR: NEGATIVE

## 2015-12-24 ENCOUNTER — Other Ambulatory Visit: Payer: BLUE CROSS/BLUE SHIELD

## 2015-12-30 ENCOUNTER — Other Ambulatory Visit: Payer: BLUE CROSS/BLUE SHIELD

## 2015-12-30 ENCOUNTER — Encounter: Payer: BLUE CROSS/BLUE SHIELD | Admitting: Adult Health

## 2016-02-15 ENCOUNTER — Telehealth: Payer: Self-pay | Admitting: Adult Health

## 2016-02-15 MED ORDER — VALACYCLOVIR HCL 1 G PO TABS: 1000.0000 mg | ORAL_TABLET | Freq: Every day | ORAL | 11 refills | Status: DC

## 2016-02-15 NOTE — Telephone Encounter (Signed)
Pt requesting a refill for valtrex, c/o vaginal outbreak.

## 2016-02-15 NOTE — Telephone Encounter (Signed)
Pt aware Valtrex was sent to pharmacy. JSY

## 2016-06-07 ENCOUNTER — Ambulatory Visit: Payer: BLUE CROSS/BLUE SHIELD | Admitting: Adult Health

## 2016-06-14 ENCOUNTER — Ambulatory Visit: Payer: BLUE CROSS/BLUE SHIELD | Admitting: Adult Health

## 2016-07-13 ENCOUNTER — Emergency Department (HOSPITAL_COMMUNITY)
Admission: EM | Admit: 2016-07-13 | Discharge: 2016-07-13 | Disposition: A | Discharge status: Home / Self Care | Payer: BLUE CROSS/BLUE SHIELD | Attending: Emergency Medicine | Admitting: Emergency Medicine

## 2016-07-13 ENCOUNTER — Encounter (HOSPITAL_COMMUNITY): Payer: Self-pay | Admitting: Cardiology

## 2016-07-13 DIAGNOSIS — J111 Influenza due to unidentified influenza virus with other respiratory manifestations: Secondary | ICD-10-CM | POA: Diagnosis not present

## 2016-07-13 DIAGNOSIS — R51 Headache: Secondary | ICD-10-CM | POA: Diagnosis present

## 2016-07-13 LAB — INFLUENZA PANEL BY PCR (TYPE A & B)
Influenza A By PCR: POSITIVE — AB
Influenza B By PCR: NEGATIVE

## 2016-07-13 MED ORDER — OSELTAMIVIR PHOSPHATE 75 MG PO CAPS
75.0000 mg | ORAL_CAPSULE | Freq: Once | ORAL | Status: AC
  Administered 2016-07-13: 75 mg via ORAL
  Filled 2016-07-13: qty 1

## 2016-07-13 MED ORDER — OSELTAMIVIR PHOSPHATE 75 MG PO CAPS: 75.0000 mg | ORAL_CAPSULE | Freq: Two times a day (BID) | ORAL | 0 refills | Status: DC

## 2016-07-13 NOTE — Discharge Instructions (Signed)
Your influenza test is positive. Please wash hands frequently. Please increase fluids. Use your mask until symptoms resolve. Use tylenol or ibuprofen for fever and aching. Use tamiflu 2 times daily until all taken.

## 2016-07-13 NOTE — ED Provider Notes (Signed)
[facility] Provider Note   CSN: [id number] Arrival date & time: 07/13/16  1711     History   Chief Complaint Chief Complaint  Patient presents with  . Influenza    HPI April Tyler is a 26 y.o. female.  Pt reports her daughter was diagnosed with flu today. She has been sick for about 3 days. She request a flu test.   The history is provided by the patient.  URI   This is a new problem. The current episode started more than 2 days ago. The problem has been gradually worsening. Associated symptoms include congestion, headaches and sore throat. Pertinent negatives include no diarrhea, no vomiting and no sneezing. Treatments tried: OTC medication. The treatment provided no relief.    Past Medical History:  Diagnosis Date  . Burning with urination 01/31/2014  . BV (bacterial vaginosis) 01/31/2014  . Contraceptive management 11/29/2013  . Exposure to chlamydia 03/13/2015  . History of elective abortion 11/29/2013  . Induced abortion 08/21/15  . Screening for STD (sexually transmitted disease) 09/11/2015  . UTI (lower urinary tract infection)   . Vaginal discharge 11/29/2013    Patient Active Problem List   Diagnosis Date Noted  . Miscarriage 12/22/2015  . Trichomonas infection 12/21/2015  . Exposure to chlamydia 03/13/2015  . Burning with urination 01/31/2014  . BV (bacterial vaginosis) 01/31/2014  . UTI (lower urinary tract infection) 01/31/2014  . History of elective abortion 11/29/2013    Past Surgical History:  Procedure Laterality Date  . elective abortion    . elective abortion  08/21/15    OB History    Gravida Para Term Preterm AB Living   6 3     1 3    SAB TAB Ectopic Multiple Live Births     1             Home Medications    Prior to Admission medications   Medication Sig Start Date End Date Taking? Authorizing Provider  metroNIDAZOLE (FLAGYL) 500 MG tablet Take 1 tablet (500 mg total) by mouth 2 (two) times daily. X 7 days. No sex or  alcohol while taking 12/21/15   Cheral MarkerKimberly R Booker, CNM  Norethindrone-Ethinyl Estradiol-Fe Biphas (LO LOESTRIN FE) 1 MG-10 MCG / 10 MCG tablet Take 1 tablet by mouth daily. Take 1 daily by mouth Patient not taking: Reported on 12/21/2015 03/13/15   Adline PotterJennifer A Griffin, NP  valACYclovir (VALTREX) 1000 MG tablet Take 1 tablet (1,000 mg total) by mouth daily. 02/15/16   Adline PotterJennifer A Griffin, NP    Family History Family History  Problem Relation Age of Onset  . Other Father     B 12 Deficiency  . Asthma Daughter   . Hypertension Maternal Grandmother   . Obesity Paternal Grandmother   . Other Sister     B 7712 Defficiency    Social History Social History  Substance Use Topics  . Smoking status: Never Smoker  . Smokeless tobacco: Never Used  . Alcohol use Yes     Comment: occ     Allergies   Patient has no known allergies.   Review of Systems Review of Systems  Constitutional: Positive for chills.  HENT: Positive for congestion and sore throat. Negative for sneezing.   Gastrointestinal: Negative for diarrhea and vomiting.  Neurological: Positive for headaches.  All other systems reviewed and are negative.    Physical Exam Updated Vital Signs BP 129/76 (BP Location: Left Arm)   Pulse 74   Temp 99 F (  37.2 C) (Oral)   Resp 18   Ht 5\' 6"  (1.676 m)   Wt 108.9 kg   LMP 07/06/2016   SpO2 99%   BMI 38.74 kg/m   Physical Exam  Constitutional: She is oriented to person, place, and time. She appears well-developed and well-nourished.  Non-toxic appearance.  HENT:  Head: Normocephalic.  Right Ear: Tympanic membrane and external ear normal.  Left Ear: Tympanic membrane and external ear normal.  Nasal congestion present. Mild increase redness of the posterior pharynx. Uvula enlarged.  Eyes: EOM and lids are normal. Pupils are equal, round, and reactive to light.  Neck: Normal range of motion. Neck supple. Carotid bruit is not present.  Cardiovascular: Normal rate, regular  rhythm, normal heart sounds, intact distal pulses and normal pulses.   Pulmonary/Chest: Breath sounds normal. No respiratory distress.  Abdominal: Soft. Bowel sounds are normal. There is no tenderness. There is no guarding.  Musculoskeletal: Normal range of motion.  Lymphadenopathy:       Head (right side): No submandibular adenopathy present.       Head (left side): No submandibular adenopathy present.    She has no cervical adenopathy.  Neurological: She is alert and oriented to person, place, and time. She has normal strength. No cranial nerve deficit or sensory deficit.  Skin: Skin is warm and dry.  Psychiatric: She has a normal mood and affect. Her speech is normal.  Nursing note and vitals reviewed.    ED Treatments / Results  Labs (all labs ordered are listed, but only abnormal results are displayed) Labs Reviewed - No data to display  EKG  EKG Interpretation None       Radiology No results found.  Procedures Procedures (including critical care time)  Medications Ordered in ED Medications - No data to display   Initial Impression / Assessment and Plan / ED Course  Patient's influenza study been submitted to the lab on nearly one hour a half. Discussed the tests with the lab. They state the tests should be resulted in the next 15-20 minutes. I explained to the patient the delay and apologized for the delay.   I have reviewed the triage vital signs and the nursing notes.  Pertinent labs & imaging results that were available during my care of the patient were reviewed by me and considered in my medical decision making (see chart for details).     **I have reviewed nursing notes, vital signs, and all appropriate lab and imaging results for this patient.*  Final Clinical Impressions(s) / ED Diagnoses MDM Pt has a daughter that was dx with flu earlier today. Pt report URI symptoms and request to be tested. Pt tested positive for flu. Results discussed with pt.  Apology to the patient for delay. Rx for tamiflu given. Pt will increase fluids and wash hands frequently. Mash provided. Pt to see PCP if not improving.   Final diagnoses:  None    New Prescriptions New Prescriptions   No medications on file     Ivery Quale, Cordelia Poche 07/13/16 2113    Mancel Bale, MD 07/14/16 [phone]

## 2016-07-13 NOTE — ED Triage Notes (Signed)
Cough ,  Sore throat and headache since yesterday.  Daughter diagnosed with the flu today.

## 2016-11-22 DIAGNOSIS — Z23 Encounter for immunization: Secondary | ICD-10-CM | POA: Diagnosis not present

## 2017-01-18 ENCOUNTER — Ambulatory Visit: Payer: BLUE CROSS/BLUE SHIELD | Admitting: Advanced Practice Midwife

## 2017-03-10 ENCOUNTER — Encounter: Payer: Self-pay | Admitting: Adult Health

## 2017-03-10 ENCOUNTER — Ambulatory Visit (INDEPENDENT_AMBULATORY_CARE_PROVIDER_SITE_OTHER): Payer: BLUE CROSS/BLUE SHIELD | Admitting: Adult Health

## 2017-03-10 VITALS — BP 120/60 | HR 82 | Ht 68.0 in | Wt 266.0 lb

## 2017-03-10 DIAGNOSIS — R300 Dysuria: Secondary | ICD-10-CM | POA: Diagnosis not present

## 2017-03-10 DIAGNOSIS — Z3201 Encounter for pregnancy test, result positive: Secondary | ICD-10-CM | POA: Diagnosis not present

## 2017-03-10 DIAGNOSIS — R110 Nausea: Secondary | ICD-10-CM | POA: Diagnosis not present

## 2017-03-10 DIAGNOSIS — N926 Irregular menstruation, unspecified: Secondary | ICD-10-CM | POA: Diagnosis not present

## 2017-03-10 DIAGNOSIS — Z3490 Encounter for supervision of normal pregnancy, unspecified, unspecified trimester: Secondary | ICD-10-CM

## 2017-03-10 DIAGNOSIS — R350 Frequency of micturition: Secondary | ICD-10-CM

## 2017-03-10 DIAGNOSIS — O3680X0 Pregnancy with inconclusive fetal viability, not applicable or unspecified: Secondary | ICD-10-CM

## 2017-03-10 LAB — POCT URINALYSIS DIPSTICK
Glucose, UA: NEGATIVE
Ketones, UA: NEGATIVE
Nitrite, UA: NEGATIVE
Protein, UA: NEGATIVE

## 2017-03-10 LAB — POCT URINE PREGNANCY: Preg Test, Ur: POSITIVE — AB

## 2017-03-10 MED ORDER — PRENATAL PLUS IRON 29-1 MG PO TABS: ORAL_TABLET | ORAL | 12 refills | Status: DC

## 2017-03-10 NOTE — Progress Notes (Signed)
Subjective:     Patient ID: April Tyler, female   DOB: 11/05/1990, 26 y.o.   MRN: [id number]  HPI April Tyler is a 26 year old black female in for UPT, has had 2+HPT, after missing periods and has some nausea in the morning.Has urinary frequency and some burning at times, is not bad.   Review of Systems +missed periods with 2+HPT Some nausea +urinary frequency and some burning    Reviewed past medical,surgical, social and family history. Reviewed medications and allergies.  Objective:   Physical Exam BP 120/60 (BP Location: Left Arm, Patient Position: Sitting, Cuff Size: Large)   Pulse 82   Ht 5\' 8"  (1.727 m)   Wt 266 lb (120.7 kg)   LMP 11/28/2016   BMI 40.45 kg/m  URine dipstick 2+ leuks and trace blood. UPT +, about 14+4 weeks by LMP, with EDD 09/04/17.Skin warm and dry. Neck: mid line trachea, normal thyroid, good ROM, no lymphadenopathy noted. Lungs: clear to ausculation bilaterally. Cardiovascular: regular rate and rhythm.Abdomen is soft and non tender.FHR 164 per doppler.      Assessment:     1. Pregnancy examination or test, positive result   2. Pregnancy, unspecified gestational age   583. Urinary frequency   4. Burning with urination   5. Encounter to determine fetal viability of pregnancy, single or unspecified fetus       Plan:    UA C&S sent Meds ordered this encounter  Medications  . Prenatal Vit-Iron Carbonyl-FA (PRENATAL PLUS IRON) 29-1 MG TABS    Sig: Take dailu    Dispense:  30 tablet    Refill:  12    Order Specific Question:   Supervising Provider    Answer:   Lazaro ArmsEURE, LUTHER Tyler [[id number]]  Return in 1 week for dating US Review handout by [facility]

## 2017-03-11 LAB — MICROSCOPIC EXAMINATION
Bacteria, UA: NONE SEEN
Casts: NONE SEEN /lpf
Epithelial Cells (non renal): >10 /hpf — AB (ref 0–10)
RBC, UA: NONE SEEN /hpf (ref 0–?)

## 2017-03-11 LAB — URINALYSIS, ROUTINE W REFLEX MICROSCOPIC
Bilirubin, UA: NEGATIVE
Glucose, UA: NEGATIVE
Ketones, UA: NEGATIVE
Nitrite, UA: NEGATIVE
Protein, UA: NEGATIVE
RBC, UA: NEGATIVE
Specific Gravity, UA: 1.024 (ref 1.005–1.030)
Urobilinogen, Ur: 0.2 mg/dL (ref 0.2–1.0)
pH, UA: 6.5 (ref 5.0–7.5)

## 2017-03-12 LAB — URINE CULTURE

## 2017-03-15 ENCOUNTER — Other Ambulatory Visit: Payer: Self-pay | Admitting: Adult Health

## 2017-03-15 ENCOUNTER — Ambulatory Visit (INDEPENDENT_AMBULATORY_CARE_PROVIDER_SITE_OTHER): Payer: BLUE CROSS/BLUE SHIELD

## 2017-03-15 DIAGNOSIS — Z3A15 15 weeks gestation of pregnancy: Secondary | ICD-10-CM | POA: Diagnosis not present

## 2017-03-15 DIAGNOSIS — O3680X0 Pregnancy with inconclusive fetal viability, not applicable or unspecified: Secondary | ICD-10-CM

## 2017-03-30 ENCOUNTER — Encounter: Payer: BLUE CROSS/BLUE SHIELD | Admitting: Women's Health

## 2017-03-30 ENCOUNTER — Ambulatory Visit: Payer: BLUE CROSS/BLUE SHIELD | Admitting: *Deleted

## 2017-04-10 ENCOUNTER — Ambulatory Visit: Payer: BLUE CROSS/BLUE SHIELD | Admitting: *Deleted

## 2017-04-10 ENCOUNTER — Ambulatory Visit (INDEPENDENT_AMBULATORY_CARE_PROVIDER_SITE_OTHER): Payer: BLUE CROSS/BLUE SHIELD | Admitting: Women's Health

## 2017-04-10 ENCOUNTER — Encounter: Payer: Self-pay | Admitting: Women's Health

## 2017-04-10 ENCOUNTER — Other Ambulatory Visit (HOSPITAL_COMMUNITY)
Admission: RE | Admit: 2017-04-10 | Discharge: 2017-04-10 | Disposition: A | Discharge status: Home / Self Care | Payer: BLUE CROSS/BLUE SHIELD | Source: Ambulatory Visit | Attending: Obstetrics & Gynecology | Admitting: Obstetrics & Gynecology

## 2017-04-10 VITALS — BP 126/70 | HR 70 | Wt 266.0 lb

## 2017-04-10 DIAGNOSIS — Z1389 Encounter for screening for other disorder: Secondary | ICD-10-CM

## 2017-04-10 DIAGNOSIS — O98812 Other maternal infectious and parasitic diseases complicating pregnancy, second trimester: Secondary | ICD-10-CM

## 2017-04-10 DIAGNOSIS — Z363 Encounter for antenatal screening for malformations: Secondary | ICD-10-CM | POA: Diagnosis not present

## 2017-04-10 DIAGNOSIS — Z3490 Encounter for supervision of normal pregnancy, unspecified, unspecified trimester: Secondary | ICD-10-CM | POA: Insufficient documentation

## 2017-04-10 DIAGNOSIS — Z124 Encounter for screening for malignant neoplasm of cervix: Secondary | ICD-10-CM | POA: Insufficient documentation

## 2017-04-10 DIAGNOSIS — Z8759 Personal history of other complications of pregnancy, childbirth and the puerperium: Secondary | ICD-10-CM

## 2017-04-10 DIAGNOSIS — O0930 Supervision of pregnancy with insufficient antenatal care, unspecified trimester: Secondary | ICD-10-CM | POA: Insufficient documentation

## 2017-04-10 DIAGNOSIS — O0932 Supervision of pregnancy with insufficient antenatal care, second trimester: Secondary | ICD-10-CM

## 2017-04-10 DIAGNOSIS — Z3482 Encounter for supervision of other normal pregnancy, second trimester: Secondary | ICD-10-CM

## 2017-04-10 DIAGNOSIS — Z331 Pregnant state, incidental: Secondary | ICD-10-CM

## 2017-04-10 DIAGNOSIS — O98512 Other viral diseases complicating pregnancy, second trimester: Secondary | ICD-10-CM | POA: Diagnosis not present

## 2017-04-10 DIAGNOSIS — N898 Other specified noninflammatory disorders of vagina: Secondary | ICD-10-CM | POA: Diagnosis not present

## 2017-04-10 DIAGNOSIS — Z3A19 19 weeks gestation of pregnancy: Secondary | ICD-10-CM | POA: Diagnosis not present

## 2017-04-10 DIAGNOSIS — Z8679 Personal history of other diseases of the circulatory system: Secondary | ICD-10-CM

## 2017-04-10 DIAGNOSIS — O26892 Other specified pregnancy related conditions, second trimester: Secondary | ICD-10-CM

## 2017-04-10 DIAGNOSIS — B009 Herpesviral infection, unspecified: Secondary | ICD-10-CM

## 2017-04-10 DIAGNOSIS — B9689 Other specified bacterial agents as the cause of diseases classified elsewhere: Secondary | ICD-10-CM

## 2017-04-10 DIAGNOSIS — N760 Acute vaginitis: Secondary | ICD-10-CM

## 2017-04-10 HISTORY — DX: Personal history of other diseases of the circulatory system: Z87.59

## 2017-04-10 HISTORY — DX: Encounter for supervision of normal pregnancy, unspecified, unspecified trimester: Z34.90

## 2017-04-10 HISTORY — DX: Personal history of other diseases of the circulatory system: Z86.79

## 2017-04-10 LAB — POCT WET PREP (WET MOUNT)
Clue Cells Wet Prep Whiff POC: POSITIVE
Trichomonas Wet Prep HPF POC: ABSENT

## 2017-04-10 LAB — POCT URINALYSIS DIPSTICK
Blood, UA: NEGATIVE
Glucose, UA: NEGATIVE
Nitrite, UA: NEGATIVE

## 2017-04-10 MED ORDER — METRONIDAZOLE 500 MG PO TABS: 500.0000 mg | ORAL_TABLET | Freq: Two times a day (BID) | ORAL | 0 refills | Status: DC

## 2017-04-10 NOTE — Progress Notes (Addendum)
INITIAL OBSTETRICAL VISIT Patient name: April Tyler MRN [id number]  Date of birth: 11/16/1990 Chief Complaint:   Initial Prenatal Visit (headaches, insomnia)  History of Present Illness:   April Tyler is a 26 y.o. [phone] African American female at 3537w0d by LMP c/w 15wk u/s, with an Estimated Date of Delivery: 09/04/17 being seen today for her initial obstetrical visit.  Wasn't sure if she was going to keep pregnancy, that is why she is late to care.  Her obstetrical history is significant for term SVB x 3, postpartum HTN requiring meds after last baby, then EAB x 2, SAB x 1.   Today she reports headaches, trouble sleeping. Is feeling fm.  Patient's last menstrual period was 11/28/2016 (exact date). Last pap 11/11/13. Results were: normal Review of Systems:   Pertinent items are noted in HPI Denies cramping/contractions, leakage of fluid, vaginal bleeding, abnormal vaginal discharge w/ itching/odor/irritation, headaches, visual changes, shortness of breath, chest pain, abdominal pain, severe nausea/vomiting, or problems with urination or bowel movements unless otherwise stated above.  Pertinent History Reviewed:  Reviewed past medical,surgical, social, obstetrical and family history.  Reviewed problem list, medications and allergies. OB History  Gravida Para Term Preterm AB Living  7 3 3   3 3   SAB TAB Ectopic Multiple Live Births  1 2     3     # Outcome Date GA Lbr Len/2nd Weight Sex Delivery Anes PTL Lv  7 Current           6 SAB 12/2015          5 TAB 2014 2824w0d         4 TAB 2013 572w0d         3 Term 04/30/10 3367w3d  8 lb 12 oz (3.969 kg) M Vag-Spont EPI N LIV  2 Term 05/15/06 102w0d  7 lb (3.175 kg) F Vag-Spont None N LIV  1 Term 06/12/05 7247w1d  8 lb 2 oz (3.685 kg) M Vag-Spont Local N LIV     Physical Assessment:   Vitals:   04/10/17 1417  BP: 126/70  Pulse: 70  Weight: 266 lb (120.7 kg)  Body mass index is 40.45 kg/m.       Physical Examination:  General  appearance - well appearing, and in no distress  Mental status - alert, oriented to person, place, and time  Psych:  She has a normal mood and affect  Skin - warm and dry, normal color, no suspicious lesions noted  Chest - effort normal, all lung fields clear to auscultation bilaterally  Heart - normal rate and regular rhythm  Abdomen - soft, nontender  Extremities:  No swelling or varicosities noted  Pelvic - VULVA: normal appearing vulva with no masses, tenderness or lesions  VAGINA: normal appearing vagina with normal color, large amt thin white malodorous d/c, no lesions  CERVIX: normal appearing cervix without discharge or lesions, no CMT  Thin prep pap is done w/ reflex HR HPV cotesting  Fetal Heart Rate (bpm): 155 via doppler  Results for orders placed or performed in visit on 04/10/17 (from the past 24 hour(s))  POCT urinalysis dipstick   Collection Time: 04/10/17  2:59 PM  Result Value Ref Range   Color, UA     Clarity, UA     Glucose, UA neg    Bilirubin, UA     Ketones, UA large    Spec Grav, UA  1.010 - 1.025   Blood, UA neg  pH, UA  5.0 - 8.0   Protein, UA trace    Urobilinogen, UA  0.2 or 1.0 E.U./dL   Nitrite, UA neg    Leukocytes, UA Moderate (2+) (A) Negative  POCT Wet Prep Mellody Drown([place])   Collection Time: 04/10/17  3:20 PM  Result Value Ref Range   Source Wet Prep POC vaginal    WBC, Wet Prep HPF POC few    Bacteria Wet Prep HPF POC None (A) Few   BACTERIA WET PREP MORPHOLOGY POC     Clue Cells Wet Prep HPF POC Many (A) None   Clue Cells Wet Prep Whiff POC Positive Whiff    Yeast Wet Prep HPF POC None    KOH Wet Prep POC     Trichomonas Wet Prep HPF POC Absent Absent    Assessment & Plan:  1) Low-Risk Pregnancy [id number] at 429w0d with an Estimated Date of Delivery: 09/04/17   2) Initial OB visit  3) Late care @ 19wks  4) H/O PP HTN> begin baby asa daily  5) Headaches> gave printed prevention/relief measures   6) Trouble sleeping> gave printed  prevention/relief measures   7) BV> Rx metronidazole 500mg  BID x 7d for BV, no sex while taking   Initial labs obtained Continue prenatal vitamins Reviewed n/v relief measures and warning s/s to report Reviewed recommended weight gain based on pre-gravid BMI Encouraged well-balanced diet Genetic Screening discussed Quad Screen: declined Cystic fibrosis screening discussed declined Ultrasound discussed; fetal survey: requested CCNC completed> spoke w/ [place] Declined flu shot. She was advised that the flu shot is recommended during pregnancy to help protect her and her baby. We discussed that it is an inactivated vaccine-so side effects are minimal, it is considered safe to receive during any trimester, and pregnant women are at a higher risk of developing potential complications from the flu, including death. She was given printed information from the CDC regarding the flu shot and the flu.    Follow-up: Return for first available anatomy u/s (no visit), then LROB 4wks .   Orders Placed This Encounter  Procedures  . Urine Culture  . US OB Comp + 14 Wk  . Obstetric Panel, Including HIV  . Varicella zoster antibody, IgG  . Urinalysis, Routine w reflex microscopic  . Pain Management Screening Profile (10S)  . POCT urinalysis dipstick  . POCT [employer]et Prep ([address])    Marge DuncansBooker, Madason Rauls Randall CNM, [facility]NP-BC 04/10/2017 3:21 PM

## 2017-04-10 NOTE — Patient Instructions (Addendum)
Raylen A Antuna, I greatly value your feedback.  If you receive a survey following your visit with us today, we appreciate you taking the time to fill it out.  Thanks, Joellyn HaffKim Sequoyah Ramone, CNM, WHNP-BC  Begin taking a 81mg  baby aspirin daily to decrease risk of preeclampsia during pregnancy    Nausea & Vomiting  Have saltine crackers or pretzels by your bed and eat a few bites before you raise your head out of bed in the morning  Eat small frequent meals throughout the day instead of large meals  Drink plenty of fluids throughout the day to stay hydrated, just don't drink a lot of fluids with your meals.  This can make your stomach fill up faster making you feel sick  Do not brush your teeth right after you eat  Products with real ginger are good for nausea, like ginger ale and ginger hard candy Make sure it says made with real ginger!  Sucking on sour candy like lemon heads is also good for nausea  If your prenatal vitamins make you nauseated, take them at night so you will sleep through the nausea  Sea Bands  If you feel like you need medicine for the nausea & vomiting please let us know  If you are unable to keep any fluids or food down please let us know   Constipation  Drink plenty of fluid, preferably water, throughout the day  Eat foods high in fiber such as fruits, vegetables, and grains  Exercise, such as walking, is a good way to keep your bowels regular  Drink warm fluids, especially warm prune juice, or decaf coffee  Eat a 1/2 cup of real oatmeal (not instant), 1/2 cup applesauce, and 1/2-1 cup warm prune juice every day  If needed, you may take Colace (docusate sodium) stool softener once or twice a day to help keep the stool soft. If you are pregnant, wait until you are out of your first trimester (12-14 weeks of pregnancy)  If you still are having problems with constipation, you may take Miralax once daily as needed to help keep your bowels regular.  If you are  pregnant, wait until you are out of your first trimester (12-14 weeks of pregnancy)  For Headaches:   Stay well hydrated, drink enough water so that your urine is clear, sometimes if you are dehydrated you can get headaches  Eat small frequent meals and snacks, sometimes if you are hungry you can get headaches  Sometimes you get headaches during pregnancy from the pregnancy hormones  You can try tylenol (1-2 regular strength 325mg  or 1-2 extra strength 500mg ) as directed on the box. The least amount of medication that works is best.   Cool compresses (cool wet washcloth or ice pack) to area of head that is hurting  You can also try drinking a caffeinated drink to see if this will help  If not helping, try below:  For Prevention of Headaches/Migraines:  CoQ10 100mg  three times daily  Vitamin B2 400mg  daily  Magnesium Oxide 400-600mg  daily  If You Get a Bad Headache/Migraine:  Benadryl 25mg    Magnesium Oxide  1 large Gatorade  2 extra strength Tylenol (1,000mg  total)  1 cup coffee or Coke  If this doesn't help please call us @ [phone]   Tips to Help You Sleep Better:   Get into a bedtime routine, try to do the same thing every night before going to bed to try to help your body wind down  Warm  baths  Avoid caffeine for at least 3 hours before going to sleep   Keep your room at a slightly cooler temperature, can try running a fan  Turn off TV, lights, phone, electronics  Lots of pillows if needed to help you get comfortable  Lavender scented items can help you sleep. You can place lavender essential oil on a cotton ball and place under your pillowcase, or place in a diffuser. [employer] has a lavender scented sleep line (plug-ins, sprays, etc). Look in the pillow aisle for lavender scented pillows.   If none of the above things help, you can try 1/2 to 1 tablet of benadryl, unisom, or tylenol pm. Do not take this every night, only when you really need it.      Second Trimester of Pregnancy The second trimester is from week 14 through week 27 (months 4 through 6). The second trimester is often a time when you feel your best. Your body has adjusted to being pregnant, and you begin to feel better physically. Usually, morning sickness has lessened or quit completely, you may have more energy, and you may have an increase in appetite. The second trimester is also a time when the fetus is growing rapidly. At the end of the sixth month, the fetus is about 9 inches long and weighs about 1 pounds. You will likely begin to feel the baby move (quickening) between 16 and 20 weeks of pregnancy. Body changes during your second trimester Your body continues to go through many changes during your second trimester. The changes vary from woman to woman.  Your weight will continue to increase. You will notice your lower abdomen bulging out.  You may begin to get stretch marks on your hips, abdomen, and breasts.  You may develop headaches that can be relieved by medicines. The medicines should be approved by your health care provider.  You may urinate more often because the fetus is pressing on your bladder.  You may develop or continue to have heartburn as a result of your pregnancy.  You may develop constipation because certain hormones are causing the muscles that push waste through your intestines to slow down.  You may develop hemorrhoids or swollen, bulging veins (varicose veins).  You may have back pain. This is caused by: ? Weight gain. ? Pregnancy hormones that are relaxing the joints in your pelvis. ? A shift in weight and the muscles that support your balance.  Your breasts will continue to grow and they will continue to become tender.  Your gums may bleed and may be sensitive to brushing and flossing.  Dark spots or blotches (chloasma, mask of pregnancy) may develop on your face. This will likely fade after the baby is born.  A dark line from  your belly button to the pubic area (linea nigra) may appear. This will likely fade after the baby is born.  You may have changes in your hair. These can include thickening of your hair, rapid growth, and changes in texture. Some women also have hair loss during or after pregnancy, or hair that feels dry or thin. Your hair will most likely return to normal after your baby is born.  What to expect at prenatal visits During a routine prenatal visit:  You will be weighed to make sure you and the fetus are growing normally.  Your blood pressure will be taken.  Your abdomen will be measured to track your baby's growth.  The fetal heartbeat will be listened to.  Any  test results from the previous visit will be discussed.  Your health care provider may ask you:  How you are feeling.  If you are feeling the baby move.  If you have had any abnormal symptoms, such as leaking fluid, bleeding, severe headaches, or abdominal cramping.  If you are using any tobacco products, including cigarettes, chewing tobacco, and electronic cigarettes.  If you have any questions.  Other tests that may be performed during your second trimester include:  Blood tests that check for: ? Low iron levels (anemia). ? High blood sugar that affects pregnant women (gestational diabetes) between 73 and 28 weeks. ? Rh antibodies. This is to check for a protein on red blood cells (Rh factor).  Urine tests to check for infections, diabetes, or protein in the urine.  An ultrasound to confirm the proper growth and development of the baby.  An amniocentesis to check for possible genetic problems.  Fetal screens for spina bifida and Down syndrome.  HIV (human immunodeficiency virus) testing. Routine prenatal testing includes screening for HIV, unless you choose not to have this test.  Follow these instructions at home: Medicines  Follow your health care provider's instructions regarding medicine use. Specific  medicines may be either safe or unsafe to take during pregnancy.  Take a prenatal vitamin that contains at least 600 micrograms (mcg) of folic acid.  If you develop constipation, try taking a stool softener if your health care provider approves. Eating and drinking  Eat a balanced diet that includes fresh fruits and vegetables, whole grains, good sources of protein such as meat, eggs, or tofu, and low-fat dairy. Your health care provider will help you determine the amount of weight gain that is right for you.  Avoid raw meat and uncooked cheese. These carry germs that can cause birth defects in the baby.  If you have low calcium intake from food, talk to your health care provider about whether you should take a daily calcium supplement.  Limit foods that are high in fat and processed sugars, such as fried and sweet foods.  To prevent constipation: ? Drink enough fluid to keep your urine clear or pale yellow. ? Eat foods that are high in fiber, such as fresh fruits and vegetables, whole grains, and beans. Activity  Exercise only as directed by your health care provider. Most women can continue their usual exercise routine during pregnancy. Try to exercise for 30 minutes at least 5 days a week. Stop exercising if you experience uterine contractions.  Avoid heavy lifting, wear low heel shoes, and practice good posture.  A sexual relationship may be continued unless your health care provider directs you otherwise. Relieving pain and discomfort  Wear a good support bra to prevent discomfort from breast tenderness.  Take warm sitz baths to soothe any pain or discomfort caused by hemorrhoids. Use hemorrhoid cream if your health care provider approves.  Rest with your legs elevated if you have leg cramps or low back pain.  If you develop varicose veins, wear support hose. Elevate your feet for 15 minutes, 3-4 times a day. Limit salt in your diet. Prenatal Care  Write down your questions.  Take them to your prenatal visits.  Keep all your prenatal visits as told by your health care provider. This is important. Safety  Wear your seat belt at all times when driving.  Make a list of emergency phone numbers, including numbers for family, friends, the hospital, and police and fire departments. General instructions  Ask your  health care provider for a referral to a local prenatal education class. Begin classes no later than the beginning of month 6 of your pregnancy.  Ask for help if you have counseling or nutritional needs during pregnancy. Your health care provider can offer advice or refer you to specialists for help with various needs.  Do not use hot tubs, steam rooms, or saunas.  Do not douche or use tampons or scented sanitary pads.  Do not cross your legs for long periods of time.  Avoid cat litter boxes and soil used by cats. These carry germs that can cause birth defects in the baby and possibly loss of the fetus by miscarriage or stillbirth.  Avoid all smoking, herbs, alcohol, and unprescribed drugs. Chemicals in these products can affect the formation and growth of the baby.  Do not use any products that contain nicotine or tobacco, such as cigarettes and e-cigarettes. If you need help quitting, ask your health care provider.  Visit your dentist if you have not gone yet during your pregnancy. Use a soft toothbrush to brush your teeth and be gentle when you floss. Contact a health care provider if:  You have dizziness.  You have mild pelvic cramps, pelvic pressure, or nagging pain in the abdominal area.  You have persistent nausea, vomiting, or diarrhea.  You have a bad smelling vaginal discharge.  You have pain when you urinate. Get help right away if:  You have a fever.  You are leaking fluid from your vagina.  You have spotting or bleeding from your vagina.  You have severe abdominal cramping or pain.  You have rapid weight gain or weight  loss.  You have shortness of breath with chest pain.  You notice sudden or extreme swelling of your face, hands, ankles, feet, or legs.  You have not felt your baby move in over an hour.  You have severe headaches that do not go away when you take medicine.  You have vision changes. Summary  The second trimester is from week 14 through week 27 (months 4 through 6). It is also a time when the fetus is growing rapidly.  Your body goes through many changes during pregnancy. The changes vary from woman to woman.  Avoid all smoking, herbs, alcohol, and unprescribed drugs. These chemicals affect the formation and growth your baby.  Do not use any tobacco products, such as cigarettes, chewing tobacco, and e-cigarettes. If you need help quitting, ask your health care provider.  Contact your health care provider if you have any questions. Keep all prenatal visits as told by your health care provider. This is important. This information is not intended to replace advice given to you by your health care provider. Make sure you discuss any questions you have with your health care provider. Document Released: 05/03/2001 Document Revised: 10/15/2015 Document Reviewed: 07/10/2012 [employer] Interactive Patient Education  2017 [employer]  PROTECT YOURSELF & YOUR BABY FROM THE FLU! Because you are pregnant, we at [facility], along with the Centers for Disease Control (CDC), recommend that you receive the flu vaccine to protect yourself and your baby from the flu. The flu is more likely to cause severe illness in pregnant women than in women of reproductive age who are not pregnant. Changes in the immune system, heart, and lungs during pregnancy make pregnant women (and women up to two weeks postpartum) more prone to severe illness from flu, including illness resulting in hospitalization. Flu also may be harmful for a pregnant  woman's developing baby. A common flu symptom is fever, which may be  associated with neural tube defects and other adverse outcomes for a developing baby. Getting vaccinated can also help protect a baby after birth from flu. (Mom passes antibodies onto the developing baby during her pregnancy.)  A Flu Vaccine is the Best Protection Against Flu Getting a flu vaccine is the first and most important step in protecting against flu. Pregnant women should get a flu shot and not the live attenuated influenza vaccine (LAIV), also known as nasal spray flu vaccine. Flu vaccines given during pregnancy help protect both the mother and her baby from flu. Vaccination has been shown to reduce the risk of flu-associated acute respiratory infection in pregnant women by up to one-half. A 2018 study showed that getting a flu shot reduced a pregnant woman's risk of being hospitalized with flu by an average of 40 percent. Pregnant women who get a flu vaccine are also helping to protect their babies from flu illness for the first several months after their birth, when they are too young to get vaccinated.   A Long Record of Safety for Flu Shots in Pregnant Women Flu shots have been given to millions of pregnant women over many years with a good safety record. There is a lot of evidence that flu vaccines can be given safely during pregnancy; though these data are limited for the first trimester. The CDC recommends that pregnant women get vaccinated during any trimester of their pregnancy. It is very important for pregnant women to get the flu shot.   Other Preventive Actions In addition to getting a flu shot, pregnant women should take the same everyday preventive actions the CDC recommends of everyone, including covering coughs, washing hands often, and avoiding people who are sick.  Symptoms and Treatment If you get sick with flu symptoms call your doctor right away. There are antiviral drugs that can treat flu illness and prevent serious flu complications. The CDC recommends prompt treatment  for people who have influenza infection or suspected influenza infection and who are at high risk of serious flu complications, such as people with asthma, diabetes (including gestational diabetes), or heart disease. Early treatment of influenza in hospitalized pregnant women has been shown to reduce the length of the hospital stay.  Symptoms Flu symptoms include fever, cough, sore throat, runny or stuffy nose, body aches, headache, chills and fatigue. Some people may also have vomiting and diarrhea. People may be infected with the flu and have respiratory symptoms without a fever.  Early Treatment is Important for Pregnant Women Treatment should begin as soon as possible because antiviral drugs work best when started early (within 48 hours after symptoms start). Antiviral drugs can make your flu illness milder and make you feel better faster. They may also prevent serious health problems that can result from flu illness. Oral oseltamivir (Tamiflu) is the preferred treatment for pregnant women because it has the most studies available to suggest that it is safe and beneficial. Antiviral drugs require a prescription from your provider. Having a fever caused by flu infection or other infections early in pregnancy may be linked to birth defects in a baby. In addition to taking antiviral drugs, pregnant women who get a fever should treat their fever with Tylenol (acetaminophen) and contact their provider immediately.  When to Seek Emergency Medical Care If you are pregnant and have any of these signs, seek care immediately:  Difficulty breathing or shortness of breath  Pain or  pressure in the chest or abdomen  Sudden dizziness  Confusion  Severe or persistent vomiting  High fever that is not responding to Tylenol (or store brand equivalent)  Decreased or no movement of your baby  [email or web address]

## 2017-04-11 LAB — MED LIST OPTION NOT SELECTED

## 2017-04-12 LAB — CYTOLOGY - PAP
Adequacy: ABSENT
Chlamydia: NEGATIVE
Diagnosis: NEGATIVE
Neisseria Gonorrhea: NEGATIVE

## 2017-04-12 LAB — PMP SCREEN PROFILE (10S), URINE
Amphetamine Scrn, Ur: NEGATIVE ng/mL
BARBITURATE SCREEN URINE: NEGATIVE ng/mL
BENZODIAZEPINE SCREEN, URINE: NEGATIVE ng/mL
CANNABINOIDS UR QL SCN: NEGATIVE ng/mL
Cocaine (Metab) Scrn, Ur: NEGATIVE ng/mL
Creatinine(Crt), U: 176.5 mg/dL (ref 20.0–300.0)
Methadone Screen, Urine: NEGATIVE ng/mL
OXYCODONE+OXYMORPHONE UR QL SCN: NEGATIVE ng/mL
Opiate Scrn, Ur: NEGATIVE ng/mL
Ph of Urine: 7.1 (ref 4.5–8.9)
Phencyclidine Qn, Ur: NEGATIVE ng/mL
Propoxyphene Scrn, Ur: NEGATIVE ng/mL

## 2017-04-13 LAB — URINE CULTURE: Organism ID, Bacteria: NO GROWTH

## 2017-04-18 ENCOUNTER — Ambulatory Visit (INDEPENDENT_AMBULATORY_CARE_PROVIDER_SITE_OTHER): Payer: BLUE CROSS/BLUE SHIELD

## 2017-04-18 DIAGNOSIS — Z363 Encounter for antenatal screening for malformations: Secondary | ICD-10-CM

## 2017-04-18 DIAGNOSIS — Z3402 Encounter for supervision of normal first pregnancy, second trimester: Secondary | ICD-10-CM

## 2017-04-18 NOTE — Progress Notes (Signed)
US 20+1 wks,cephalic,cx 3.8 cm,post pl gr 0,normal ovaries bilat,svp of fluid 5.3 cm,fhr 140 bpm,efw 350 g,anatomy complete,no obvious abnormalities

## 2017-05-08 ENCOUNTER — Ambulatory Visit (INDEPENDENT_AMBULATORY_CARE_PROVIDER_SITE_OTHER): Payer: BLUE CROSS/BLUE SHIELD | Admitting: Women's Health

## 2017-05-08 ENCOUNTER — Encounter: Payer: Self-pay | Admitting: Women's Health

## 2017-05-08 VITALS — BP 112/60 | HR 88 | Wt 272.0 lb

## 2017-05-08 DIAGNOSIS — Z3482 Encounter for supervision of other normal pregnancy, second trimester: Secondary | ICD-10-CM

## 2017-05-08 DIAGNOSIS — Z3A23 23 weeks gestation of pregnancy: Secondary | ICD-10-CM

## 2017-05-08 DIAGNOSIS — Z1389 Encounter for screening for other disorder: Secondary | ICD-10-CM

## 2017-05-08 DIAGNOSIS — O1212 Gestational proteinuria, second trimester: Secondary | ICD-10-CM | POA: Diagnosis not present

## 2017-05-08 DIAGNOSIS — Z331 Pregnant state, incidental: Secondary | ICD-10-CM

## 2017-05-08 LAB — POCT URINALYSIS DIPSTICK
Blood, UA: NEGATIVE
Glucose, UA: NEGATIVE
Nitrite, UA: NEGATIVE

## 2017-05-08 NOTE — Patient Instructions (Addendum)
April Tyler, I greatly value your feedback.  If you receive a survey following your visit with us today, we appreciate you taking the time to fill it out.  Thanks, [name], CNM, WHNP-BC   You can try a 7 night over the counter yeast cream such as Monistat 7. It does not need to be brand name, generic is fine, but please make sure it is a 7 night cream.    You will have your sugar test next visit.  Please do not eat or drink anything after midnight the night before you come, not even water.  You will be here for at least two hours.     Call the office [phone]) or go to [facility] if:  You begin to have strong, frequent contractions  Your water breaks.  Sometimes it is a big gush of fluid, sometimes it is just a trickle that keeps getting your panties wet or running down your legs  You have vaginal bleeding.  It is normal to have a small amount of spotting if your cervix was checked.   You don't feel your baby moving like normal.  If you don't, get you something to eat and drink and lay down and focus on feeling your baby move.   If your baby is still not moving like normal, you should call the office or go to [facility].  Second Trimester of Pregnancy The second trimester is from week 13 through week 28, months 4 through 6. The second trimester is often a time when you feel your best. Your body has also adjusted to being pregnant, and you begin to feel better physically. Usually, morning sickness has lessened or quit completely, you may have more energy, and you may have an increase in appetite. The second trimester is also a time when the fetus is growing rapidly. At the end of the sixth month, the fetus is about 9 inches long and weighs about 1 pounds. You will likely begin to feel the baby move (quickening) between 18 and 20 weeks of the pregnancy. BODY CHANGES Your body goes through many changes during pregnancy. The changes vary from woman to woman.   Your weight will  continue to increase. You will notice your lower abdomen bulging out.  You may begin to get stretch marks on your hips, abdomen, and breasts.  You may develop headaches that can be relieved by medicines approved by your health care provider.  You may urinate more often because the fetus is pressing on your bladder.  You may develop or continue to have heartburn as a result of your pregnancy.  You may develop constipation because certain hormones are causing the muscles that push waste through your intestines to slow down.  You may develop hemorrhoids or swollen, bulging veins (varicose veins).  You may have back pain because of the weight gain and pregnancy hormones relaxing your joints between the bones in your pelvis and as a result of a shift in weight and the muscles that support your balance.  Your breasts will continue to grow and be tender.  Your gums may bleed and may be sensitive to brushing and flossing.  Dark spots or blotches (chloasma, mask of pregnancy) may develop on your face. This will likely fade after the baby is born.  A dark line from your belly button to the pubic area (linea nigra) may appear. This will likely fade after the baby is born.  You may have changes in your hair. These  can include thickening of your hair, rapid growth, and changes in texture. Some women also have hair loss during or after pregnancy, or hair that feels dry or thin. Your hair will most likely return to normal after your baby is born. WHAT TO EXPECT AT YOUR PRENATAL VISITS During a routine prenatal visit:  You will be weighed to make sure you and the fetus are growing normally.  Your blood pressure will be taken.  Your abdomen will be measured to track your baby's growth.  The fetal heartbeat will be listened to.  Any test results from the previous visit will be discussed. Your health care provider may ask you:  How you are feeling.  If you are feeling the baby move.  If you  have had any abnormal symptoms, such as leaking fluid, bleeding, severe headaches, or abdominal cramping.  If you have any questions. Other tests that may be performed during your second trimester include:  Blood tests that check for:  Low iron levels (anemia).  Gestational diabetes (between 24 and 28 weeks).  Rh antibodies.  Urine tests to check for infections, diabetes, or protein in the urine.  An ultrasound to confirm the proper growth and development of the baby.  An amniocentesis to check for possible genetic problems.  Fetal screens for spina bifida and Down syndrome. HOME CARE INSTRUCTIONS   Avoid all smoking, herbs, alcohol, and unprescribed drugs. These chemicals affect the formation and growth of the baby.  Follow your health care provider's instructions regarding medicine use. There are medicines that are either safe or unsafe to take during pregnancy.  Exercise only as directed by your health care provider. Experiencing uterine cramps is a good sign to stop exercising.  Continue to eat regular, healthy meals.  Wear a good support bra for breast tenderness.  Do not use hot tubs, steam rooms, or saunas.  Wear your seat belt at all times when driving.  Avoid raw meat, uncooked cheese, cat litter boxes, and soil used by cats. These carry germs that can cause birth defects in the baby.  Take your prenatal vitamins.  Try taking a stool softener (if your health care provider approves) if you develop constipation. Eat more high-fiber foods, such as fresh vegetables or fruit and whole grains. Drink plenty of fluids to keep your urine clear or pale yellow.  Take warm sitz baths to soothe any pain or discomfort caused by hemorrhoids. Use hemorrhoid cream if your health care provider approves.  If you develop varicose veins, wear support hose. Elevate your feet for 15 minutes, 3-4 times a day. Limit salt in your diet.  Avoid heavy lifting, wear low heel shoes, and  practice good posture.  Rest with your legs elevated if you have leg cramps or low back pain.  Visit your dentist if you have not gone yet during your pregnancy. Use a soft toothbrush to brush your teeth and be gentle when you floss.  A sexual relationship may be continued unless your health care provider directs you otherwise.  Continue to go to all your prenatal visits as directed by your health care provider. SEEK MEDICAL CARE IF:   You have dizziness.  You have mild pelvic cramps, pelvic pressure, or nagging pain in the abdominal area.  You have persistent nausea, vomiting, or diarrhea.  You have a bad smelling vaginal discharge.  You have pain with urination. SEEK IMMEDIATE MEDICAL CARE IF:   You have a fever.  You are leaking fluid from your vagina.  You have spotting or bleeding from your vagina.  You have severe abdominal cramping or pain.  You have rapid weight gain or loss.  You have shortness of breath with chest pain.  You notice sudden or extreme swelling of your face, hands, ankles, feet, or legs.  You have not felt your baby move in over an hour.  You have severe headaches that do not go away with medicine.  You have vision changes. Document Released: 05/03/2001 Document Revised: 05/14/2013 Document Reviewed: 07/10/2012 [facility] Patient Information 2015 [place], [place]. This information is not intended to replace advice given to you by your health care provider. Make sure you discuss any questions you have with your health care provider.

## 2017-05-08 NOTE — Progress Notes (Signed)
   LOW-RISK PREGNANCY VISIT Patient name: April LericheKourtney A Riva MRN [id number]  Date of birth: 11/13/1990 Chief Complaint:   Routine Prenatal Visit  History of Present Illness:   April LericheKourtney A Heffler is a 26 y.o. [id number] female at 5059w0d with an Estimated Date of Delivery: 09/04/17 being seen today for ongoing management of a low-risk pregnancy.  Today she reports exposed to resident w/ chicken pox and shingles at work. Never got PN1- forgot. Thinks she got yeast infection from flagyl. Contractions: Not present. Vag. Bleeding: None.  Movement: Present. denies leaking of fluid. Review of Systems:   Pertinent items are noted in HPI Denies abnormal vaginal discharge w/ itching/odor/irritation, headaches, visual changes, shortness of breath, chest pain, abdominal pain, severe nausea/vomiting, or problems with urination or bowel movements unless otherwise stated above. Pertinent History Reviewed:  Reviewed past medical,surgical, social, obstetrical and family history.  Reviewed problem list, medications and allergies. Physical Assessment:   Vitals:   05/08/17 1404  BP: 112/60  Pulse: 88  Weight: 272 lb (123.4 kg)  Body mass index is 41.36 kg/m.        Physical Examination:   General appearance: Well appearing, and in no distress  Mental status: Alert, oriented to person, place, and time  Skin: Warm & dry  Cardiovascular: Normal heart rate noted  Respiratory: Normal respiratory effort, no distress  Abdomen: Soft, gravid, nontender  Pelvic: Cervical exam deferred         Extremities: Edema: Trace  Fetal Status: Fetal Heart Rate (bpm): 152 Fundal Height: 26 cm Movement: Present    Results for orders placed or performed in visit on 05/08/17 (from the past 24 hour(s))  POCT Urinalysis Dipstick   Collection Time: 05/08/17  2:09 PM  Result Value Ref Range   Color, UA     Clarity, UA     Glucose, UA negative    Bilirubin, UA     Ketones, UA trace    Spec Grav, UA  1.010 - 1.025   Blood, UA negative     pH, UA  5.0 - 8.0   Protein, UA 1+    Urobilinogen, UA  0.2 or 1.0 E.U./dL   Nitrite, UA negative    Leukocytes, UA Large (3+) (A) Negative   Appearance     Odor      Assessment & Plan:  1) Low-risk pregnancy [id number] at 2359w0d with an Estimated Date of Delivery: 09/04/17   2) Proteinuria w/ large leuks> will send urine cx   Labs/procedures today: pn1 labs, will check for varicella immunity, can use otc yeast cream for yeast- if not improved let us know  Plan:  Continue routine obstetrical care   Reviewed: Preterm labor symptoms and general obstetric precautions including but not limited to vaginal bleeding, contractions, leaking of fluid and fetal movement were reviewed in detail with the patient.  All questions were answered  Follow-up: Return in about 4 weeks (around 06/05/2017) for LROB, PN2.  Orders Placed This Encounter  Procedures  . Urine Culture  . POCT Urinalysis Dipstick   Marge DuncansBooker, Kimberly Randall CNM, [facility] 05/08/2017 2:39 PM

## 2017-05-10 LAB — URINE CULTURE

## 2017-05-11 ENCOUNTER — Telehealth: Payer: Self-pay | Admitting: *Deleted

## 2017-05-11 ENCOUNTER — Other Ambulatory Visit: Payer: Self-pay | Admitting: Women's Health

## 2017-05-11 DIAGNOSIS — R8271 Bacteriuria: Secondary | ICD-10-CM | POA: Insufficient documentation

## 2017-05-11 HISTORY — DX: Bacteriuria: R82.71

## 2017-05-11 MED ORDER — AMOXICILLIN 500 MG PO CAPS: 500.0000 mg | ORAL_CAPSULE | Freq: Two times a day (BID) | ORAL | 0 refills | Status: DC

## 2017-05-11 NOTE — Telephone Encounter (Signed)
LMOVM that urine showed GBS bacteria and needed antibiotics now and during labor. Advised to pick up and take all of the medication but to call our office if she had further questions.

## 2017-05-23 NOTE — L&D Delivery Note (Signed)
VAGINAL DELIVERY NOTE  27 y.o. [id number] at 6553w6d delivered a viable female infant at [id number] in cephalic, ROA position. No nuchal cord. Left anterior shoulder delivered with ease followed by 60 sec delayed cord clamping. Cord clamped x2 and cut. Placenta delivered spontaneously intact, with 3VC. Fundus firm on exam with massage and pitocin.  Mother: Anesthesia: epidural Laceration: none Suture repair: N/A EBL: 100 mL  Baby: Apgars: 9, 9 Weight: pending Cord pH: not sent  Good hemostasis noted.  Mom to postpartum.  Baby to couplet-care/skin-to-skin.  Durward Parcelavid McMullen, DO, PGY-2 09/03/2017, 4:22 AM

## 2017-06-05 ENCOUNTER — Other Ambulatory Visit: Payer: BLUE CROSS/BLUE SHIELD

## 2017-06-05 ENCOUNTER — Encounter: Payer: BLUE CROSS/BLUE SHIELD | Admitting: Women's Health

## 2017-06-14 ENCOUNTER — Telehealth: Payer: Self-pay | Admitting: Obstetrics & Gynecology

## 2017-06-14 NOTE — Telephone Encounter (Signed)
LMOVM that she can get flu shot at next visit and doesn't need appointment. Advised to call back if she has any questions.

## 2017-06-15 ENCOUNTER — Encounter: Payer: Self-pay | Admitting: Obstetrics & Gynecology

## 2017-06-15 ENCOUNTER — Other Ambulatory Visit: Payer: Self-pay

## 2017-06-15 ENCOUNTER — Ambulatory Visit (INDEPENDENT_AMBULATORY_CARE_PROVIDER_SITE_OTHER): Payer: BLUE CROSS/BLUE SHIELD | Admitting: Obstetrics & Gynecology

## 2017-06-15 ENCOUNTER — Ambulatory Visit (INDEPENDENT_AMBULATORY_CARE_PROVIDER_SITE_OTHER): Payer: BLUE CROSS/BLUE SHIELD | Admitting: *Deleted

## 2017-06-15 VITALS — BP 112/60 | HR 90 | Wt 274.0 lb

## 2017-06-15 VITALS — BP 110/66 | HR 84

## 2017-06-15 DIAGNOSIS — Z23 Encounter for immunization: Secondary | ICD-10-CM | POA: Diagnosis not present

## 2017-06-15 DIAGNOSIS — Z331 Pregnant state, incidental: Secondary | ICD-10-CM

## 2017-06-15 DIAGNOSIS — Z3A28 28 weeks gestation of pregnancy: Secondary | ICD-10-CM

## 2017-06-15 DIAGNOSIS — Z1389 Encounter for screening for other disorder: Secondary | ICD-10-CM

## 2017-06-15 DIAGNOSIS — Z3483 Encounter for supervision of other normal pregnancy, third trimester: Secondary | ICD-10-CM

## 2017-06-15 DIAGNOSIS — O2343 Unspecified infection of urinary tract in pregnancy, third trimester: Secondary | ICD-10-CM

## 2017-06-15 DIAGNOSIS — N390 Urinary tract infection, site not specified: Secondary | ICD-10-CM

## 2017-06-15 LAB — POCT URINALYSIS DIPSTICK
Blood, UA: NEGATIVE
Glucose, UA: NEGATIVE
Ketones, UA: NEGATIVE
Nitrite, UA: NEGATIVE

## 2017-06-15 MED ORDER — CEPHALEXIN 500 MG PO CAPS: 500.0000 mg | ORAL_CAPSULE | Freq: Four times a day (QID) | ORAL | 0 refills | Status: DC

## 2017-06-15 NOTE — Progress Notes (Signed)
Pt in for flu vaccine. Given IM in left deltoid. Patient tolerated well. Baby is moving well.

## 2017-06-15 NOTE — Progress Notes (Signed)
Chief Complaint  Patient presents with  . Routine Prenatal Visit      27 y.o. [id number] Patient's last menstrual period was 11/28/2016 (exact date). The current method of family planning is pregnant.  Outpatient Encounter Medications as of 06/15/2017  Medication Sig  . Prenatal Vit-Iron Carbonyl-FA (PRENATAL PLUS IRON) 29-1 MG TABS Take dailu  . [DISCONTINUED] amoxicillin (AMOXIL) 500 MG capsule Take 1 capsule (500 mg total) by mouth 2 (two) times daily. X 7 days   No facility-administered encounter medications on file as of 06/15/2017.     Subjective Pt has urgency frequency dysuria for about 10 days Was treated for BV then had yeast, doesn't know if still related Has not taken any OTC meds for it  6430w3d Estimated Date of Delivery: 09/04/17  She reports good FM no bleeding No ROM Past Medical History:  Diagnosis Date  . Burning with urination 01/31/2014  . BV (bacterial vaginosis) 01/31/2014  . Contraceptive management 11/29/2013  . Exposure to chlamydia 03/13/2015  . History of elective abortion 11/29/2013  . Induced abortion 08/21/15  . Screening for STD (sexually transmitted disease) 09/11/2015  . UTI (lower urinary tract infection)   . Vaginal discharge 11/29/2013    Past Surgical History:  Procedure Laterality Date  . elective abortion    . elective abortion  08/21/15    OB History    Gravida Para Term Preterm AB Living   7 3 3   3 3    SAB TAB Ectopic Multiple Live Births   1 2     3       No Known Allergies  Social History   Socioeconomic History  . Marital status: Single    Spouse name: None  . Number of children: None  . Years of education: None  . Highest education level: None  Social Needs  . Financial resource strain: None  . Food insecurity - worry: None  . Food insecurity - inability: None  . Transportation needs - medical: None  . Transportation needs - non-medical: None  Occupational History  . None  Tobacco Use  . Smoking status:  Never Smoker  . Smokeless tobacco: Never Used  Substance and Sexual Activity  . Alcohol use: No    Comment: occ; not now  . Drug use: No  . Sexual activity: Yes    Birth control/protection: None  Other Topics Concern  . None  Social History Narrative  . None    Family History  Problem Relation Age of Onset  . Other Father        B 12 Deficiency  . Asthma Daughter   . Hypertension Maternal Grandmother   . Obesity Paternal Grandmother   . Other Sister        B 12 Defficiency  . Hypertension Maternal Aunt     Medications:       Current Outpatient Medications:  .  Prenatal Vit-Iron Carbonyl-FA (PRENATAL PLUS IRON) 29-1 MG TABS, Take dailu, Disp: 30 tablet, Rfl: 12  Objective Blood pressure 112/60, pulse 90, weight 274 lb (124.3 kg), last menstrual period 11/28/2016.  Gen WDWN NAD Abdomen soft FH 30 cm FHT 144 Back no CVAT  Pertinent ROS  No nausea, vomiting or diarrhea Nor fever chills or other constitutional symptoms   Labs or studies U/A is negative will culture    Impression Diagnoses this Encounter::   ICD-10-CM   1. Lower urinary tract infectious disease N39.0   2. Encounter for supervision of other normal pregnancy  in third trimester Z34.83   3. Pregnant state, incidental Z33.1 POCT urinalysis dipstick  4. Screening for genitourinary condition Z13.89 POCT urinalysis dipstick    Established relevant diagnosis(es):   Plan/Recommendations: No orders of the defined types were placed in this encounter.   Labs or Scans Ordered: Orders Placed This Encounter  Procedures  . POCT urinalysis dipstick    Management:: Will culture urine, e prescribe keflex and take if informed to do so via mychart  Follow up Return for keep scheduled.       All questions were answered.

## 2017-06-21 ENCOUNTER — Other Ambulatory Visit: Payer: BLUE CROSS/BLUE SHIELD

## 2017-06-21 ENCOUNTER — Encounter: Payer: Self-pay | Admitting: Obstetrics and Gynecology

## 2017-06-21 ENCOUNTER — Ambulatory Visit (INDEPENDENT_AMBULATORY_CARE_PROVIDER_SITE_OTHER): Payer: BLUE CROSS/BLUE SHIELD | Admitting: Obstetrics and Gynecology

## 2017-06-21 VITALS — BP 120/70 | HR 93 | Wt 274.4 lb

## 2017-06-21 DIAGNOSIS — Z3A29 29 weeks gestation of pregnancy: Secondary | ICD-10-CM

## 2017-06-21 DIAGNOSIS — B9689 Other specified bacterial agents as the cause of diseases classified elsewhere: Secondary | ICD-10-CM | POA: Diagnosis not present

## 2017-06-21 DIAGNOSIS — Z1389 Encounter for screening for other disorder: Secondary | ICD-10-CM

## 2017-06-21 DIAGNOSIS — N898 Other specified noninflammatory disorders of vagina: Secondary | ICD-10-CM

## 2017-06-21 DIAGNOSIS — O23593 Infection of other part of genital tract in pregnancy, third trimester: Secondary | ICD-10-CM | POA: Diagnosis not present

## 2017-06-21 DIAGNOSIS — Z3483 Encounter for supervision of other normal pregnancy, third trimester: Secondary | ICD-10-CM

## 2017-06-21 DIAGNOSIS — Z131 Encounter for screening for diabetes mellitus: Secondary | ICD-10-CM | POA: Diagnosis not present

## 2017-06-21 DIAGNOSIS — N760 Acute vaginitis: Secondary | ICD-10-CM | POA: Diagnosis not present

## 2017-06-21 DIAGNOSIS — O26893 Other specified pregnancy related conditions, third trimester: Secondary | ICD-10-CM

## 2017-06-21 DIAGNOSIS — Z331 Pregnant state, incidental: Secondary | ICD-10-CM

## 2017-06-21 LAB — POCT URINALYSIS DIPSTICK
Glucose, UA: NEGATIVE
Ketones, UA: NEGATIVE
Nitrite, UA: NEGATIVE
Protein, UA: NEGATIVE

## 2017-06-21 LAB — POCT WET PREP WITH KOH
Clue Cells Wet Prep HPF POC: POSITIVE
Yeast Wet Prep HPF POC: NEGATIVE

## 2017-06-21 MED ORDER — METRONIDAZOLE 500 MG PO TABS: 500.0000 mg | ORAL_TABLET | Freq: Two times a day (BID) | ORAL | 0 refills | Status: AC

## 2017-06-21 NOTE — Addendum Note (Signed)
Addended by: Colen DarlingYOUNG, Chadwin Fury S on: 06/21/2017 10:25 AM   Modules accepted: Orders

## 2017-06-21 NOTE — Progress Notes (Signed)
Patient ID: April Tyler, female   DOB: 10/07/1990, 27 y.o.   MRN: [id number]   LOW-RISK PREGNANCY VISIT Patient name: April Tyler MRN [id number]  Date of birth: 09/29/1990 Chief Complaint:   Routine Prenatal Visit (PN2 today)  History of Present Illness:   April Tyler is a 27 y.o. [phone] female at 7034w2d with an Estimated Date of Delivery: 09/04/17 being seen today for ongoing management of a low-risk pregnancy.  Today she reports mild vaginal discharge. She reports that she was advised to use monistat. She states that she doesn't believe it worked and would like to be rechecked.. Contractions: Irregular. Vag. Bleeding: None.  Movement: Present. denies leaking of fluid. Review of Systems:   Pertinent items are noted in HPI Denies abnormal vaginal discharge w/ itching/odor/irritation, headaches, visual changes, shortness of breath, chest pain, abdominal pain, severe nausea/vomiting, or problems with urination or bowel movements unless otherwise stated above. Pertinent History Reviewed:  Reviewed past medical,surgical, social, obstetrical and family history.  Reviewed problem list, medications and allergies. Physical Assessment:   Vitals:   06/21/17 0926  BP: 120/70  Pulse: 93  Weight: 274 lb 6.4 oz (124.5 kg)  Body mass index is 41.72 kg/m.        Physical Examination:   General appearance: Well appearing, and in no distress  Mental status: Alert, oriented to person, place, and time  Skin: Warm & dry  Cardiovascular: Normal heart rate noted  Respiratory: Normal respiratory effort, no distress  Abdomen: Soft, gravid, nontender  Pelvic: Mild erythema to vaginal entrance. Healthy looking.  Positive whiff test. Positive clue cells. Negative yeast.        Extremities: Edema: Trace  Fetal Status: Fetal Heart Rate (bpm): 135 Fundal Height: 31 cm Movement: Present    Results for orders placed or performed in visit on 06/21/17 (from the past 24 hour(s))  POCT urinalysis dipstick   Collection Time: 06/21/17  9:26 AM  Result Value Ref Range   Color, UA     Clarity, UA     Glucose, UA neg    Bilirubin, UA     Ketones, UA neg    Spec Grav, UA  1.010 - 1.025   Blood, UA trace    pH, UA  5.0 - 8.0   Protein, UA neg    Urobilinogen, UA  0.2 or 1.0 E.U./dL   Nitrite, UA neg    Leukocytes, UA Moderate (2+) (A) Negative   Appearance     Odor      Assessment & Plan:  1) Low-risk pregnancy [id number] at 6434w2d with an Estimated Date of Delivery: 09/04/17   2) BV=>  Metronidazole, 500 BID x 7 days   Meds: No orders of the defined types were placed in this encounter.  Labs/procedures today: PN2  Plan:  Continue routine obstetrical care   Follow-up: Return in about 4 weeks (around 07/19/2017) for LROB.  Orders Placed This Encounter  Procedures  . POCT urinalysis dipstick   By signing my name below, I, Diona BrownerJennifer Gorman, attest that this documentation has been prepared under the direction and in the presence of Tilda BurrowFerguson, Alianna Wurster V, MD. Electronically Signed: Diona BrownerJennifer Gorman, Medical Scribe. 06/21/17. 10:12 AM.  I personally performed the services described in this documentation, which was SCRIBED in my presence. The recorded information has been reviewed and considered accurate. It has been edited as necessary during review. Tilda BurrowJohn V Clerence Gubser, MD

## 2017-06-22 LAB — CBC
Hematocrit: 32.8 % — ABNORMAL LOW (ref 34.0–46.6)
Hemoglobin: 10.6 g/dL — ABNORMAL LOW (ref 11.1–15.9)
MCH: 27.5 pg (ref 26.6–33.0)
MCHC: 32.3 g/dL (ref 31.5–35.7)
MCV: 85 fL (ref 79–97)
Platelets: 222 10*3/uL (ref 150–379)
RBC: 3.85 x10E6/uL (ref 3.77–5.28)
RDW: 13.7 % (ref 12.3–15.4)
WBC: 7.2 10*3/uL (ref 3.4–10.8)

## 2017-06-22 LAB — GLUCOSE TOLERANCE, 2 HOURS W/ 1HR
Glucose, 1 hour: 111 mg/dL (ref 65–179)
Glucose, 2 hour: 87 mg/dL (ref 65–152)
Glucose, Fasting: 80 mg/dL (ref 65–91)

## 2017-06-22 LAB — ANTIBODY SCREEN: Antibody Screen: NEGATIVE

## 2017-06-22 LAB — RPR: RPR Ser Ql: NONREACTIVE

## 2017-06-22 LAB — HIV ANTIBODY (ROUTINE TESTING W REFLEX): HIV Screen 4th Generation wRfx: NONREACTIVE

## 2017-07-19 ENCOUNTER — Encounter: Payer: BLUE CROSS/BLUE SHIELD | Admitting: Obstetrics and Gynecology

## 2017-07-19 ENCOUNTER — Encounter: Payer: Self-pay | Admitting: Advanced Practice Midwife

## 2017-07-19 ENCOUNTER — Ambulatory Visit (INDEPENDENT_AMBULATORY_CARE_PROVIDER_SITE_OTHER): Payer: BLUE CROSS/BLUE SHIELD | Admitting: Advanced Practice Midwife

## 2017-07-19 VITALS — BP 130/50 | HR 97 | Wt 273.6 lb

## 2017-07-19 DIAGNOSIS — Z3A33 33 weeks gestation of pregnancy: Secondary | ICD-10-CM

## 2017-07-19 DIAGNOSIS — Z1389 Encounter for screening for other disorder: Secondary | ICD-10-CM

## 2017-07-19 DIAGNOSIS — Z3483 Encounter for supervision of other normal pregnancy, third trimester: Secondary | ICD-10-CM

## 2017-07-19 DIAGNOSIS — Z331 Pregnant state, incidental: Secondary | ICD-10-CM

## 2017-07-19 LAB — POCT URINALYSIS DIPSTICK
Glucose, UA: NEGATIVE
Ketones, UA: NEGATIVE
Nitrite, UA: NEGATIVE

## 2017-07-19 MED ORDER — VALACYCLOVIR HCL 500 MG PO TABS: 500.0000 mg | ORAL_TABLET | Freq: Two times a day (BID) | ORAL | 3 refills | Status: DC

## 2017-07-19 NOTE — Patient Instructions (Signed)
Keshauna A Cavan, I greatly value your feedback.  If you receive a survey following your visit with us today, we appreciate you taking the time to fill it out.  Thanks, Cathie BeamsFran Cresenzo-Dishmon, CNM   Call the office [phone]) or go to [facility] if:  You begin to have strong, frequent contractions  Your water breaks.  Sometimes it is a big gush of fluid, sometimes it is just a trickle that keeps getting your panties wet or running down your legs  You have vaginal bleeding.  It is normal to have a small amount of spotting if your cervix was checked.   You don't feel your baby moving like normal.  If you don't, get you something to eat and drink and lay down and focus on feeling your baby move.  You should feel at least 10 movements in 2 hours.  If you don't, you should call the office or go to [facility].    Tdap Vaccine  It is recommended that you get the Tdap vaccine during the third trimester of EACH pregnancy to help protect your baby from getting pertussis (whooping cough)  27-36 weeks is the BEST time to do this so that you can pass the protection on to your baby. During pregnancy is better than after pregnancy, but if you are unable to get it during pregnancy it will be offered at the hospital.   You can get this vaccine at the health department or your family doctor  Everyone who will be around your baby should also be up-to-date on their vaccines. Adults (who are not pregnant) only need 1 dose of Tdap during adulthood.   Third Trimester of Pregnancy The third trimester is from week 29 through week 42, months 7 through 9. The third trimester is a time when the fetus is growing rapidly. At the end of the ninth month, the fetus is about 20 inches in length and weighs 6-10 pounds.  BODY CHANGES Your body goes through many changes during pregnancy. The changes vary from woman to woman.   Your weight will continue to increase. You can expect to gain 25-35 pounds (11-16 kg) by  the end of the pregnancy.  You may begin to get stretch marks on your hips, abdomen, and breasts.  You may urinate more often because the fetus is moving lower into your pelvis and pressing on your bladder.  You may develop or continue to have heartburn as a result of your pregnancy.  You may develop constipation because certain hormones are causing the muscles that push waste through your intestines to slow down.  You may develop hemorrhoids or swollen, bulging veins (varicose veins).  You may have pelvic pain because of the weight gain and pregnancy hormones relaxing your joints between the bones in your pelvis. Backaches may result from overexertion of the muscles supporting your posture.  You may have changes in your hair. These can include thickening of your hair, rapid growth, and changes in texture. Some women also have hair loss during or after pregnancy, or hair that feels dry or thin. Your hair will most likely return to normal after your baby is born.  Your breasts will continue to grow and be tender. A yellow discharge may leak from your breasts called colostrum.  Your belly button may stick out.  You may feel short of breath because of your expanding uterus.  You may notice the fetus "dropping," or moving lower in your abdomen.  You may have a bloody mucus  discharge. This usually occurs a few days to a week before labor begins.  Your cervix becomes thin and soft (effaced) near your due date. WHAT TO EXPECT AT YOUR PRENATAL EXAMS  You will have prenatal exams every 2 weeks until week 36. Then, you will have weekly prenatal exams. During a routine prenatal visit:  You will be weighed to make sure you and the fetus are growing normally.  Your blood pressure is taken.  Your abdomen will be measured to track your baby's growth.  The fetal heartbeat will be listened to.  Any test results from the previous visit will be discussed.  You may have a cervical check near your  due date to see if you have effaced. At around 36 weeks, your caregiver will check your cervix. At the same time, your caregiver will also perform a test on the secretions of the vaginal tissue. This test is to determine if a type of bacteria, Group B streptococcus, is present. Your caregiver will explain this further. Your caregiver may ask you:  What your birth plan is.  How you are feeling.  If you are feeling the baby move.  If you have had any abnormal symptoms, such as leaking fluid, bleeding, severe headaches, or abdominal cramping.  If you have any questions. Other tests or screenings that may be performed during your third trimester include:  Blood tests that check for low iron levels (anemia).  Fetal testing to check the health, activity level, and growth of the fetus. Testing is done if you have certain medical conditions or if there are problems during the pregnancy. FALSE LABOR You may feel small, irregular contractions that eventually go away. These are called Braxton Hicks contractions, or false labor. Contractions may last for hours, days, or even weeks before true labor sets in. If contractions come at regular intervals, intensify, or become painful, it is best to be seen by your caregiver.  SIGNS OF LABOR   Menstrual-like cramps.  Contractions that are 5 minutes apart or less.  Contractions that start on the top of the uterus and spread down to the lower abdomen and back.  A sense of increased pelvic pressure or back pain.  A watery or bloody mucus discharge that comes from the vagina. If you have any of these signs before the 37th week of pregnancy, call your caregiver right away. You need to go to the hospital to get checked immediately. HOME CARE INSTRUCTIONS   Avoid all smoking, herbs, alcohol, and unprescribed drugs. These chemicals affect the formation and growth of the baby.  Follow your caregiver's instructions regarding medicine use. There are medicines  that are either safe or unsafe to take during pregnancy.  Exercise only as directed by your caregiver. Experiencing uterine cramps is a good sign to stop exercising.  Continue to eat regular, healthy meals.  Wear a good support bra for breast tenderness.  Do not use hot tubs, steam rooms, or saunas.  Wear your seat belt at all times when driving.  Avoid raw meat, uncooked cheese, cat litter boxes, and soil used by cats. These carry germs that can cause birth defects in the baby.  Take your prenatal vitamins.  Try taking a stool softener (if your caregiver approves) if you develop constipation. Eat more high-fiber foods, such as fresh vegetables or fruit and whole grains. Drink plenty of fluids to keep your urine clear or pale yellow.  Take warm sitz baths to soothe any pain or discomfort caused by hemorrhoids. Use  hemorrhoid cream if your caregiver approves.  If you develop varicose veins, wear support hose. Elevate your feet for 15 minutes, 3-4 times a day. Limit salt in your diet.  Avoid heavy lifting, wear low heal shoes, and practice good posture.  Rest a lot with your legs elevated if you have leg cramps or low back pain.  Visit your dentist if you have not gone during your pregnancy. Use a soft toothbrush to brush your teeth and be gentle when you floss.  A sexual relationship may be continued unless your caregiver directs you otherwise.  Do not travel far distances unless it is absolutely necessary and only with the approval of your caregiver.  Take prenatal classes to understand, practice, and ask questions about the labor and delivery.  Make a trial run to the hospital.  Pack your hospital bag.  Prepare the baby's nursery.  Continue to go to all your prenatal visits as directed by your caregiver. SEEK MEDICAL CARE IF:  You are unsure if you are in labor or if your water has broken.  You have dizziness.  You have mild pelvic cramps, pelvic pressure, or nagging  pain in your abdominal area.  You have persistent nausea, vomiting, or diarrhea.  You have a bad smelling vaginal discharge.  You have pain with urination. SEEK IMMEDIATE MEDICAL CARE IF:   You have a fever.  You are leaking fluid from your vagina.  You have spotting or bleeding from your vagina.  You have severe abdominal cramping or pain.  You have rapid weight loss or gain.  You have shortness of breath with chest pain.  You notice sudden or extreme swelling of your face, hands, ankles, feet, or legs.  You have not felt your baby move in over an hour.  You have severe headaches that do not go away with medicine.  You have vision changes. Document Released: 05/03/2001 Document Revised: 05/14/2013 Document Reviewed: 07/10/2012 [facility] Patient Information 2015 [place], [place]. This information is not intended to replace advice given to you by your health care provider. Make sure you discuss any questions you have with your health care provider.

## 2017-07-19 NOTE — Addendum Note (Signed)
Addended by: Jacklyn ShellRESENZO-DISHMON, Karmel Patricelli on: 07/19/2017 05:16 PM   Modules accepted: Orders

## 2017-07-19 NOTE — Progress Notes (Signed)
[id number] 7033w2d Estimated Date of Delivery: 09/04/17  Blood pressure (!) 130/50, pulse 97, weight 273 lb 9.6 oz (124.1 kg), last menstrual period 11/28/2016.   BP weight and urine results all reviewed and noted.  Please refer to the obstetrical flow sheet for the fundal height and fetal heart rate documentation:  Patient reports good fetal movement, denies any bleeding and no rupture of membranes symptoms or regular contractions. Patient is without complaints. All questions were answered.  Orders Placed This Encounter  Procedures  . POCT urinalysis dipstick    Plan:  Continued routine obstetrical care,   Return in about 2 weeks (around 08/02/2017) for LROB, sign BTL form.

## 2017-08-02 ENCOUNTER — Encounter: Payer: Self-pay | Admitting: Obstetrics and Gynecology

## 2017-08-02 ENCOUNTER — Ambulatory Visit (INDEPENDENT_AMBULATORY_CARE_PROVIDER_SITE_OTHER): Payer: BLUE CROSS/BLUE SHIELD | Admitting: Obstetrics and Gynecology

## 2017-08-02 VITALS — BP 100/50 | HR 98 | Wt 274.6 lb

## 2017-08-02 DIAGNOSIS — Z8759 Personal history of other complications of pregnancy, childbirth and the puerperium: Secondary | ICD-10-CM

## 2017-08-02 DIAGNOSIS — Z1389 Encounter for screening for other disorder: Secondary | ICD-10-CM

## 2017-08-02 DIAGNOSIS — Z8679 Personal history of other diseases of the circulatory system: Secondary | ICD-10-CM

## 2017-08-02 DIAGNOSIS — Z3A35 35 weeks gestation of pregnancy: Secondary | ICD-10-CM

## 2017-08-02 DIAGNOSIS — Z3483 Encounter for supervision of other normal pregnancy, third trimester: Secondary | ICD-10-CM

## 2017-08-02 DIAGNOSIS — Z331 Pregnant state, incidental: Secondary | ICD-10-CM

## 2017-08-02 LAB — POCT URINALYSIS DIPSTICK
Blood, UA: NEGATIVE
Glucose, UA: NEGATIVE
Ketones, UA: NEGATIVE
Nitrite, UA: NEGATIVE

## 2017-08-02 NOTE — Progress Notes (Signed)
LOW-RISK PREGNANCY VISIT Patient name: April Tyler MRN [id number]  Date of birth: 01/11/1991 Chief Complaint:   Routine Prenatal Visit  History of Present Illness:   April Tyler is a 27 y.o. [id number] female at 3316w2d with an Estimated Date of Delivery: 09/04/17 being seen today for ongoing management of a low-risk pregnancy.  Today she reports no complaints. She has signed the tubal ligation papers during a previous visit, but is still unsure whether its the right choice for her. She is worried about the surgical procedure, and fearful of the possibility of pain.  I spent a long time explaining the technical aspects of the tubal sterilization procedure, focusing on Filshie clip or partial salpingectomy, . Her biggest worry is the possible pain she will experience .   She previously used Implanon but had it removed after 3 years. She says it made her gain a lot of weight.  Contractions: Irregular.  .  Movement: Present. denies leaking of fluid. Review of Systems:   Pertinent items are noted in HPI Denies abnormal vaginal discharge w/ itching/odor/irritation, headaches, visual changes, shortness of breath, chest pain, abdominal pain, severe nausea/vomiting, or problems with urination or bowel movements unless otherwise stated above. Pertinent History Reviewed:  Reviewed past medical,surgical, social, obstetrical and family history.  Reviewed problem list, medications and allergies. Physical Assessment:   Vitals:   08/02/17 1030  BP: (!) 100/50  Pulse: 98  Weight: 274 lb 9.6 oz (124.6 kg)  Body mass index is 41.75 kg/m.        Physical Examination:   General appearance: Well appearing, and in no distress  Mental status: Alert, oriented to person, place, and time  Skin: Warm & dry  Cardiovascular: Normal heart rate noted  Respiratory: Normal respiratory effort, no distress  Abdomen: Soft, gravid, nontender  Pelvic: Cervical exam deferred         Extremities: Edema: None  Fetal  Status: Fetal Heart Rate (bpm): 138 Fundal Height: 35 cm Movement: Present    Results for orders placed or performed in visit on 08/02/17 (from the past 24 hour(s))  POCT urinalysis dipstick   Collection Time: 08/02/17 10:37 AM  Result Value Ref Range   Color, UA     Clarity, UA     Glucose, UA neg    Bilirubin, UA     Ketones, UA neg    Spec Grav, UA  1.010 - 1.025   Blood, UA neg    pH, UA  5.0 - 8.0   Protein, UA trace    Urobilinogen, UA  0.2 or 1.0 E.U./dL   Nitrite, UA neg    Leukocytes, UA 4+ (A) Negative   Appearance     Odor      Assessment & Plan:  1) Low-risk pregnancy [id number] at 3316w2d with an Estimated Date of Delivery: 09/04/17    Meds: No orders of the defined types were placed in this encounter.  Labs/procedures today: none  Plan:  Continue routine obstetrical care, BTL  Reviewed: Preterm labor symptoms and general obstetric precautions including but not limited to vaginal bleeding, contractions, leaking of fluid and fetal movement were reviewed in detail with the patient.  All questions were answered in detail regarding tubal ligation  Follow-up: Return in about 2 weeks (around 08/16/2017), or if symptoms worsen or fail to improve, for LROB.  Orders Placed This Encounter  Procedures  . POCT urinalysis dipstick     By signing my name below, I, Izna Ahmed, attest that this  documentation has been prepared under the direction and in the presence of Tilda Burrow, MD. Electronically Signed: Redge Gainer, Medical Scribe. 08/02/17. 11:03 AM.  I personally performed the services described in this documentation, which was SCRIBED in my presence. The recorded information has been reviewed and considered accurate. It has been edited as necessary during review. Tilda Burrow, MD

## 2017-08-08 ENCOUNTER — Telehealth: Payer: Self-pay | Admitting: Women's Health

## 2017-08-08 DIAGNOSIS — O26893 Other specified pregnancy related conditions, third trimester: Secondary | ICD-10-CM

## 2017-08-08 DIAGNOSIS — R300 Dysuria: Principal | ICD-10-CM

## 2017-08-08 MED ORDER — CEPHALEXIN 500 MG PO CAPS: 500.0000 mg | ORAL_CAPSULE | Freq: Four times a day (QID) | ORAL | 0 refills | Status: DC

## 2017-08-08 NOTE — Telephone Encounter (Signed)
Per RN:   Pt called stating that she feels like she may have a UTI. She states that she is having frequent urination and burning/pressure with urination. Advised pt that she could come by and leave a sample for us to send for culture. She states that she will come by tomorrow and bring it. She [place] [facility] [place].   Rx keflex QID x 7d, pt to come in AM and leave urine cx then start antibiotic.   Cheral MarkerKimberly R. Booker, CNM, [facility] 08/08/2017 5:16 PM

## 2017-08-08 NOTE — Telephone Encounter (Signed)
Pt called stating that she feels like she may have a UTI. She states that she is having frequent urination and burning/pressure with urination. Advised pt that she could come by and leave a sample for us to send for culture. She states that she will come by tomorrow and bring it. She states that [place] [facility] [place].

## 2017-08-09 ENCOUNTER — Other Ambulatory Visit: Payer: BLUE CROSS/BLUE SHIELD

## 2017-08-10 DIAGNOSIS — Z029 Encounter for administrative examinations, unspecified: Secondary | ICD-10-CM

## 2017-08-11 LAB — URINE CULTURE

## 2017-08-16 ENCOUNTER — Encounter: Payer: Self-pay | Admitting: Obstetrics and Gynecology

## 2017-08-16 ENCOUNTER — Ambulatory Visit (INDEPENDENT_AMBULATORY_CARE_PROVIDER_SITE_OTHER): Payer: BLUE CROSS/BLUE SHIELD | Admitting: Obstetrics and Gynecology

## 2017-08-16 VITALS — BP 104/50 | HR 86 | Wt 277.2 lb

## 2017-08-16 DIAGNOSIS — Z1389 Encounter for screening for other disorder: Secondary | ICD-10-CM

## 2017-08-16 DIAGNOSIS — Z3483 Encounter for supervision of other normal pregnancy, third trimester: Secondary | ICD-10-CM

## 2017-08-16 DIAGNOSIS — Z331 Pregnant state, incidental: Secondary | ICD-10-CM

## 2017-08-16 DIAGNOSIS — Z3A37 37 weeks gestation of pregnancy: Secondary | ICD-10-CM | POA: Diagnosis not present

## 2017-08-16 LAB — POCT URINALYSIS DIPSTICK
Glucose, UA: NEGATIVE
Leukocytes, UA: NEGATIVE
Nitrite, UA: NEGATIVE

## 2017-08-16 NOTE — Progress Notes (Signed)
   LOW-RISK PREGNANCY VISIT Patient name: April Tyler MRN [id number]  Date of birth: 10/27/1990 Chief Complaint:   Routine Prenatal Visit ( GC/CHL)  History of Present Illness:   April Tyler is a 27 y.o. [phone] female at 5655w2d with an Estimated Date of Delivery: 09/04/17 being seen today for ongoing management of a low-risk pregnancy.  Today she reports no complaints. Contractions: Irregular. Vag. Bleeding: None.  Movement: Present. denies leaking of fluid. Review of Systems:   Pertinent items are noted in HPI Denies abnormal vaginal discharge w/ itching/odor/irritation, headaches, visual changes, shortness of breath, chest pain, abdominal pain, severe nausea/vomiting, or problems with urination or bowel movements unless otherwise stated above. Pertinent History Reviewed:  Reviewed past medical,surgical, social, obstetrical and family history.  Reviewed problem list, medications and allergies. Physical Assessment:   Vitals:   08/16/17 1413  BP: (!) 104/50  Pulse: 86  Weight: 277 lb 3.2 oz (125.7 kg)  Body mass index is 42.15 kg/m.        Physical Examination:   General appearance: Well appearing, and in no distress  Mental status: Alert, oriented to person, place, and time  Skin: Warm & dry  Cardiovascular: Normal heart rate noted  Respiratory: Normal respiratory effort, no distress  Abdomen: Soft, gravid, nontender  Pelvic: Cervical exam performed  Dilation: 1    long, posterior  Extremities: Edema: Trace  Fetal Status: Fetal Heart Rate (bpm): 146   Movement: Present    Results for orders placed or performed in visit on 08/16/17 (from the past 24 hour(s))  POCT urinalysis dipstick   Collection Time: 08/16/17  2:13 PM  Result Value Ref Range   Color, UA     Clarity, UA     Glucose, UA neg    Bilirubin, UA     Ketones, UA trace    Spec Grav, UA  1.010 - 1.025   Blood, UA trace    pH, UA  5.0 - 8.0   Protein, UA trace    Urobilinogen, UA  0.2 or 1.0 E.U./dL   Nitrite, UA neg    Leukocytes, UA Negative Negative   Appearance     Odor      Assessment & Plan:  1) Low-risk pregnancy [id number] at 1255w2d with an Estimated Date of Delivery: 09/04/17    Meds: No orders of the defined types were placed in this encounter.  Labs/procedures today: GBS/Chl  Plan:  Continue routine obstetrical care   Reviewed: Preterm labor symptoms and general obstetric precautions including but not limited to vaginal bleeding, contractions, leaking of fluid and fetal movement were reviewed in detail with the patient.  All questions were answered  Follow-up: Return in about 1 week (around 08/23/2017), or if symptoms worsen or fail to improve, for LROB.  Orders Placed This Encounter  Procedures  . GC/Chlamydia Probe Amp  . POCT urinalysis dipstick     By signing my name below, I, Izna Ahmed, attest that this documentation has been prepared under the direction and in the presence of Tilda BurrowFerguson, Rosalynd Mcwright V, MD. Electronically Signed: Redge GainerIzna Ahmed, Medical Scribe. 08/16/17. 2:21 PM. I personally performed the services described in this documentation, which was SCRIBED in my presence. The recorded information has been reviewed and considered accurate. It has been edited as necessary during review. Tilda BurrowJohn V Vinicio Lynk, MD

## 2017-08-18 LAB — GC/CHLAMYDIA PROBE AMP
Chlamydia trachomatis, NAA: NEGATIVE
Neisseria gonorrhoeae by PCR: NEGATIVE

## 2017-08-21 ENCOUNTER — Inpatient Hospital Stay (HOSPITAL_COMMUNITY)
Admission: AD | Admit: 2017-08-21 | Discharge: 2017-08-21 | Disposition: A | Discharge status: Home / Self Care | Payer: BLUE CROSS/BLUE SHIELD | Source: Ambulatory Visit | Attending: Obstetrics and Gynecology | Admitting: Obstetrics and Gynecology

## 2017-08-21 ENCOUNTER — Encounter: Payer: Self-pay | Admitting: Women's Health

## 2017-08-21 ENCOUNTER — Encounter (HOSPITAL_COMMUNITY): Payer: Self-pay

## 2017-08-21 ENCOUNTER — Ambulatory Visit (INDEPENDENT_AMBULATORY_CARE_PROVIDER_SITE_OTHER): Payer: BLUE CROSS/BLUE SHIELD | Admitting: Women's Health

## 2017-08-21 VITALS — BP 120/90 | HR 98 | Wt 271.4 lb

## 2017-08-21 DIAGNOSIS — Z3A38 38 weeks gestation of pregnancy: Secondary | ICD-10-CM

## 2017-08-21 DIAGNOSIS — R030 Elevated blood-pressure reading, without diagnosis of hypertension: Secondary | ICD-10-CM

## 2017-08-21 DIAGNOSIS — O9989 Other specified diseases and conditions complicating pregnancy, childbirth and the puerperium: Secondary | ICD-10-CM | POA: Diagnosis not present

## 2017-08-21 DIAGNOSIS — Z1389 Encounter for screening for other disorder: Secondary | ICD-10-CM

## 2017-08-21 DIAGNOSIS — R110 Nausea: Secondary | ICD-10-CM

## 2017-08-21 DIAGNOSIS — Z3483 Encounter for supervision of other normal pregnancy, third trimester: Secondary | ICD-10-CM

## 2017-08-21 DIAGNOSIS — O26893 Other specified pregnancy related conditions, third trimester: Secondary | ICD-10-CM | POA: Diagnosis not present

## 2017-08-21 DIAGNOSIS — R1011 Right upper quadrant pain: Secondary | ICD-10-CM

## 2017-08-21 DIAGNOSIS — Z331 Pregnant state, incidental: Secondary | ICD-10-CM

## 2017-08-21 LAB — POCT URINALYSIS DIPSTICK
Blood, UA: NEGATIVE
Glucose, UA: NEGATIVE
Leukocytes, UA: NEGATIVE

## 2017-08-21 LAB — COMPREHENSIVE METABOLIC PANEL
ALT: 13 U/L — ABNORMAL LOW (ref 14–54)
AST: 17 U/L (ref 15–41)
Albumin: 3.2 g/dL — ABNORMAL LOW (ref 3.5–5.0)
Alkaline Phosphatase: 110 U/L (ref 38–126)
Anion gap: 10 (ref 5–15)
BUN: 9 mg/dL (ref 6–20)
CO2: 21 mmol/L — ABNORMAL LOW (ref 22–32)
Calcium: 8.7 mg/dL — ABNORMAL LOW (ref 8.9–10.3)
Chloride: 103 mmol/L (ref 101–111)
Creatinine, Ser: 0.71 mg/dL (ref 0.44–1.00)
GFR calc Af Amer: >60 mL/min (ref >60)
GFR calc non Af Amer: >60 mL/min (ref >60)
Glucose, Bld: 107 mg/dL — ABNORMAL HIGH (ref 65–99)
Potassium: 3.2 mmol/L — ABNORMAL LOW (ref 3.5–5.1)
Sodium: 134 mmol/L — ABNORMAL LOW (ref 135–145)
Total Bilirubin: 0.3 mg/dL (ref 0.3–1.2)
Total Protein: 7.1 g/dL (ref 6.5–8.1)

## 2017-08-21 LAB — PROTEIN / CREATININE RATIO, URINE
Creatinine, Urine: 184 mg/dL
Protein Creatinine Ratio: 0.15 mg/mg{Cre} (ref 0.00–0.15)
Total Protein, Urine: 28 mg/dL

## 2017-08-21 LAB — CBC
HCT: 34 % — ABNORMAL LOW (ref 36.0–46.0)
Hemoglobin: 11.2 g/dL — ABNORMAL LOW (ref 12.0–15.0)
MCH: 27.5 pg (ref 26.0–34.0)
MCHC: 32.9 g/dL (ref 30.0–36.0)
MCV: 83.3 fL (ref 78.0–100.0)
Platelets: 208 10*3/uL (ref 150–400)
RBC: 4.08 MIL/uL (ref 3.87–5.11)
RDW: 13.6 % (ref 11.5–15.5)
WBC: 8.7 10*3/uL (ref 4.0–10.5)

## 2017-08-21 NOTE — Patient Instructions (Signed)
[name], I greatly value your feedback.  If you receive a survey following your visit with us today, we appreciate you taking the time to fill it out.  Thanks, [name], CNM, WHNP-BC   Call the office [phone]) or go to [facility] if:  You begin to have strong, frequent contractions  Your water breaks.  Sometimes it is a big gush of fluid, sometimes it is just a trickle that keeps getting your panties wet or running down your legs  You have vaginal bleeding.  It is normal to have a small amount of spotting if your cervix was checked.   You don't feel your baby moving like normal.  If you don't, get you something to eat and drink and lay down and focus on feeling your baby move.  You should feel at least 10 movements in 2 hours.  If you don't, you should call the office or go to [facility].    Call the office [phone]) or [facility] to [facility] for these signs of pre-eclampsia:  Severe headache that does not go away with Tylenol  Visual changes- seeing spots, double, blurred vision  Pain under your right breast or upper abdomen that does not go away with Tums or heartburn medicine  Nausea and/or vomiting  Severe swelling in your hands, feet, and face

## 2017-08-21 NOTE — MAU Note (Signed)
Pt sent from office for Pre E labs

## 2017-08-21 NOTE — MAU Note (Signed)
Pt said she had headaches on Saturday and Sunday but did get relief, none today. Some RUQ pain since 6 or 7 months. Unsure of blurry vision, wears contacts and sleeps in them. No LOF or bleeding, +FM

## 2017-08-21 NOTE — Progress Notes (Signed)
   Work-in LOW-RISK PREGNANCY VISIT Patient name: April LericheKourtney A Dini MRN [id number]  Date of birth: 02/10/1991 Chief Complaint:   work-in-ob (headache x 2 days)  History of Present Illness:   April LericheKourtney A Wands is a 27 y.o. [id number] female at 872w0d with an Estimated Date of Delivery: 09/04/17 being seen today for ongoing management of a low-risk pregnancy.  Today she is being seen as a work-in for report of headache x 2 days, no headache this morning. Some blurred vision- unsure if it is from her contacts. Some RUQ pain, which has been going on for a month or so now, thought it could be pulled muscle. Some nausea. 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 No h/o pre-e. This is a new FOB. Contractions: Irregular.  .  Movement: Present. denies leaking of fluid. Review of Systems:   Pertinent items are noted in HPI Denies abnormal vaginal discharge w/ itching/odor/irritation, headaches, visual changes, shortness of breath, chest pain, abdominal pain, severe nausea/vomiting, or problems with urination or bowel movements unless otherwise stated above. Pertinent History Reviewed:  Reviewed past medical,surgical, social, obstetrical and family history.  Reviewed problem list, medications and allergies. Physical Assessment:   Vitals:   08/21/17 1350 08/21/17 1428  BP: 140/90 120/90  Pulse: 98   Weight: 271 lb 6.4 oz (123.1 kg)   Body mass index is 41.27 kg/m.        Physical Examination:   General appearance: Well appearing, and in no distress  Mental status: Alert, oriented to person, place, and time  Skin: Warm & dry  Cardiovascular: Normal heart rate noted  Respiratory: Normal respiratory effort, no distress  Abdomen: Soft, gravid, nontender  Pelvic: Cervical exam deferred         Extremities: Edema: None, DTRs 2+, no clonus  Fetal Status: Fetal Heart Rate (bpm): 141 Fundal Height: 39 cm Movement: Present Presentation: Vertex  Results for orders placed or performed in visit on 08/21/17 (from the  past 24 hour(s))  POCT urinalysis dipstick   Collection Time: 08/21/17  1:56 PM  Result Value Ref Range   Color, UA     Clarity, UA     Glucose, UA neg    Bilirubin, UA     Ketones, UA large    Spec Grav, UA  1.010 - 1.025   Blood, UA neg    pH, UA  5.0 - 8.0   Protein, UA trace    Urobilinogen, UA  0.2 or 1.0 E.U./dL   Nitrite, UA eg    Leukocytes, UA Negative Negative   Appearance     Odor      Assessment & Plan:  1) Low-risk pregnancy [id number] at 392w0d with an Estimated Date of Delivery: 09/04/17   2) Elevated bp, w/ intermittent headaches, blurred vision, ruq pain, nausea, has trace proteinuria- but has had this- will send to [facility] for pre-e eval, notified Erin, NP   Meds: No orders of the defined types were placed in this encounter.  Labs/procedures today: none  Plan:  Continue routine obstetrical care  Reviewed: Term labor symptoms and general obstetric precautions including but not limited to vaginal bleeding, contractions, leaking of fluid and fetal movement were reviewed in detail with the patient.  All questions were answered  Follow-up: Return for As scheduled Wed.  Orders Placed This Encounter  Procedures  . POCT urinalysis dipstick   Cheral MarkerKimberly R Makaria Poarch CNM, [facility] 08/21/2017 3:32 PM

## 2017-08-21 NOTE — Discharge Instructions (Signed)
Hypertension During Pregnancy °Hypertension, commonly called high blood pressure, is when the force of blood pumping through your arteries is too strong. Arteries are blood vessels that carry blood from the heart throughout the body. Hypertension during pregnancy can cause problems for you and your baby. Your baby may be born early (prematurely) or may not weigh as much as he or she should at birth. Very bad cases of hypertension during pregnancy can be life-threatening. °Different types of hypertension can occur during pregnancy. These include: °· Chronic hypertension. This happens when: °? You have hypertension before pregnancy and it continues during pregnancy. °? You develop hypertension before you are [redacted] weeks pregnant, and it continues during pregnancy. °· Gestational hypertension. This is hypertension that develops after the 20th week of pregnancy. °· Preeclampsia, also called toxemia of pregnancy. This is a very serious type of hypertension that develops only during pregnancy. It affects the whole body, and it can be very dangerous for you and your baby. ° °Gestational hypertension and preeclampsia usually go away within 6 weeks after your baby is born. Women who have hypertension during pregnancy have a greater chance of developing hypertension later in life or during future pregnancies. °What are the causes? °The exact cause of hypertension is not known. °What increases the risk? °There are certain factors that make it more likely for you to develop hypertension during pregnancy. These include: °· Having hypertension during a previous pregnancy or prior to pregnancy. °· Being overweight. °· Being older than age 40. °· Being pregnant for the first time or being pregnant with more than one baby. °· Becoming pregnant using fertilization methods such as IVF (in vitro fertilization). °· Having diabetes, kidney problems, or systemic lupus erythematosus. °· Having a family history of hypertension. ° °What are the  signs or symptoms? °Chronic hypertension and gestational hypertension rarely cause symptoms. Preeclampsia causes symptoms, which may include: °· Increased protein in your urine. Your health care provider will check for this at every visit before you give birth (prenatal visit). °· Severe headaches. °· Sudden weight gain. °· Swelling of the hands, face, legs, and feet. °· Nausea and vomiting. °· Vision problems, such as blurred or double vision. °· Numbness in the face, arms, legs, and feet. °· Dizziness. °· Slurred speech. °· Sensitivity to bright lights. °· Abdominal pain. °· Convulsions. ° °How is this diagnosed? °You may be diagnosed with hypertension during a routine prenatal exam. At each prenatal visit, you may: °· Have a urine test to check for high amounts of protein in your urine. °· Have your blood pressure checked. A blood pressure reading is recorded as two numbers, such as "120 over 80" (or 120/80). The first ("top") number is called the systolic pressure. It is a measure of the pressure in your arteries when your heart beats. The second ("bottom") number is called the diastolic pressure. It is a measure of the pressure in your arteries as your heart relaxes between beats. Blood pressure is measured in a unit called mm Hg. A normal blood pressure reading is: °? Systolic: below 120. °? Diastolic: below 80. ° °The type of hypertension that you are diagnosed with depends on your test results and when your symptoms developed. °· Chronic hypertension is usually diagnosed before 20 weeks of pregnancy. °· Gestational hypertension is usually diagnosed after 20 weeks of pregnancy. °· Hypertension with high amounts of protein in the urine is diagnosed as preeclampsia. °· Blood pressure measurements that stay above 160 systolic, or above 110 diastolic, are   signs of severe preeclampsia. ° °How is this treated? °Treatment for hypertension during pregnancy varies depending on the type of hypertension you have and how  serious it is. °· If you take medicines called ACE inhibitors to treat chronic hypertension, you may need to switch medicines. ACE inhibitors should not be taken during pregnancy. °· If you have gestational hypertension, you may need to take blood pressure medicine. °· If you are at risk for preeclampsia, your health care provider may recommend that you take a low-dose aspirin every day to prevent high blood pressure during your pregnancy. °· If you have severe preeclampsia, you may need to be hospitalized so you and your baby can be monitored closely. You may also need to take medicine (magnesium sulfate) to prevent seizures and to lower blood pressure. This medicine may be given as an injection or through an IV tube. °· In some cases, if your condition gets worse, you may need to deliver your baby early. ° °Follow these instructions at home: °Eating and drinking °· Drink enough fluid to keep your urine clear or pale yellow. °· Eat a healthy diet that is low in salt (sodium). Do not add salt to your food. Check food labels to see how much sodium a food or beverage contains. °Lifestyle °· Do not use any products that contain nicotine or tobacco, such as cigarettes and e-cigarettes. If you need help quitting, ask your health care provider. °· Do not use alcohol. °· Avoid caffeine. °· Avoid stress as much as possible. Rest and get plenty of sleep. °General instructions °· Take over-the-counter and prescription medicines only as told by your health care provider. °· While lying down, lie on your left side. This keeps pressure off your baby. °· While sitting or lying down, raise (elevate) your feet. Try putting some pillows under your lower legs. °· Exercise regularly. Ask your health care provider what kinds of exercise are best for you. °· Keep all prenatal and follow-up visits as told by your health care provider. This is important. °Contact a health care provider if: °· You have symptoms that your health care  provider told you may require more treatment or monitoring, such as: °? Fever. °? Vomiting. °? Headache. °Get help right away if: °· You have severe abdominal pain or vomiting that does not get better with treatment. °· You suddenly develop swelling in your hands, ankles, or face. °· You gain 4 lbs (1.8 kg) or more in 1 week. °· You develop vaginal bleeding, or you have blood in your urine. °· You do not feel your baby moving as much as usual. °· You have blurred or double vision. °· You have muscle twitching or sudden tightening (spasms). °· You have shortness of breath. °· Your lips or fingernails turn blue. °This information is not intended to replace advice given to you by your health care provider. Make sure you discuss any questions you have with your health care provider. °Document Released: 01/25/2011 Document Revised: 11/27/2015 Document Reviewed: 10/23/2015 °Elsevier Interactive Patient Education © 2018 [employer] ° °

## 2017-08-21 NOTE — MAU Provider Note (Signed)
Chief Complaint: Hypertension   First Provider Initiated Contact with Patient 08/21/17 1652      SUBJECTIVE HPI: April Tyler is a 27 y.o. [id number] at 651w0d by LMP who presents to maternity admissions reporting intermittent headaches since 08/19/2017. She states the headaches onset gradually, were associated with a vague sensation of "not feeling like myself" and slight lightheadedness/nausea. Headaches got worse when she was moving around and outside and improved with Tylenol and lying down. She was seen at her OB office today and was noted to have elevated pressure of 140/90 that was rechecked later and noted to be 120/90. She was referred here for pre-e evaluation. She has also had a periodic RUQ abd pain that lasts seconds at a time and occurs periodically throughout the day, this has been occurring for the last 1-2 months. She has also had periodic swelling in feet and hands bilaterally throughout the pregnancy. Unsure if she has had blurred vision earlier as she says she slept in her contacts, but denies any blurred vision at present.  Aggravating factors: Movement Alleiviating factors: Tylenol and rest  She denies vaginal bleeding, vaginal itching/burning, urinary symptoms,n/v, or fever/chills.     HPI  Past Medical History:  Diagnosis Date  . Burning with urination 01/31/2014  . BV (bacterial vaginosis) 01/31/2014  . Contraceptive management 11/29/2013  . Exposure to chlamydia 03/13/2015  . History of elective abortion 11/29/2013  . Induced abortion 08/21/15  . Screening for STD (sexually transmitted disease) 09/11/2015  . UTI (lower urinary tract infection)   . Vaginal discharge 11/29/2013   Past Surgical History:  Procedure Laterality Date  . elective abortion    . elective abortion  08/21/15   Social History   Socioeconomic History  . Marital status: Single    Spouse name: Not on file  . Number of children: Not on file  . Years of education: Not on file  . Highest education  level: Not on file  Occupational History  . Not on file  Social Needs  . Financial resource strain: Not on file  . Food insecurity:    Worry: Not on file    Inability: Not on file  . Transportation needs:    Medical: Not on file    Non-medical: Not on file  Tobacco Use  . Smoking status: Never Smoker  . Smokeless tobacco: Never Used  Substance and Sexual Activity  . Alcohol use: No    Comment: occ; not now  . Drug use: No  . Sexual activity: Yes    Birth control/protection: None  Lifestyle  . Physical activity:    Days per week: Not on file    Minutes per session: Not on file  . Stress: Not on file  Relationships  . Social connections:    Talks on phone: Not on file    Gets together: Not on file    Attends religious service: Not on file    Active member of club or organization: Not on file    Attends meetings of clubs or organizations: Not on file    Relationship status: Not on file  . Intimate partner violence:    Fear of current or ex partner: Not on file    Emotionally abused: Not on file    Physically abused: Not on file    Forced sexual activity: Not on file  Other Topics Concern  . Not on file  Social History Narrative  . Not on file   No current facility-administered medications on file  prior to encounter.    Current Outpatient Medications on File Prior to Encounter  Medication Sig Dispense Refill  . acetaminophen (TYLENOL) 500 MG tablet Take 500 mg by mouth every 6 (six) hours as needed for mild pain or headache.    . cephALEXin (KEFLEX) 500 MG capsule Take 1 capsule (500 mg total) by mouth 4 (four) times daily. X 7 days 28 capsule 0  . Prenatal Vit-Iron Carbonyl-FA (PRENATAL PLUS IRON) 29-1 MG TABS Take dailu (Patient taking differently: Take 1 tablet by mouth daily. Take dailu) 30 tablet 12  . valACYclovir (VALTREX) 500 MG tablet Take 1 tablet (500 mg total) by mouth 2 (two) times daily. 60 tablet 3   No Known Allergies  ROS:  Review of Systems   Constitutional: Negative for chills and fever.  Eyes: Negative for visual disturbance.  Gastrointestinal: Positive for abdominal pain (RUQ, periodic x1-2 months). Negative for nausea and vomiting.  Genitourinary: Negative for vaginal bleeding and vaginal discharge.  Neurological: Positive for headaches.  All other systems reviewed and are negative.    I have reviewed patient's Past Medical Hx, Surgical Hx, Family Hx, Social Hx, medications and allergies.   Physical Exam   Patient Vitals for the past 24 hrs:  BP Pulse Resp  08/21/17 1833 133/78 98 16  08/21/17 1745 125/67 100 -  08/21/17 1731 125/75 (!) 103 -  08/21/17 1716 135/77 97 -  08/21/17 1701 (!) 141/72 98 -  08/21/17 1645 125/82 (!) 114 -  08/21/17 1635 135/76 (!) 108 -   Constitutional: Well-developed, well-nourished female in no acute distress.  Cardiovascular: normal rate Respiratory: normal effort GI: Abd soft, non-tender.  MS: Extremities nontender, no edema, normal ROM Neurologic: Alert and oriented x 4.    LAB RESULTS Results for orders placed or performed during the hospital encounter of 08/21/17 (from the past 24 hour(s))  Protein / creatinine ratio, urine     Status: None   Collection Time: 08/21/17  4:28 PM  Result Value Ref Range   Creatinine, Urine 184.00 mg/dL   Total Protein, Urine 28 mg/dL   Protein Creatinine Ratio 0.15 0.00 - 0.15 mg/mg[Cre]  CBC     Status: Abnormal   Collection Time: 08/21/17  5:23 PM  Result Value Ref Range   WBC 8.7 4.0 - 10.5 K/uL   RBC 4.08 3.87 - 5.11 MIL/uL   Hemoglobin 11.2 (L) 12.0 - 15.0 g/dL   HCT 91.4 (L) 78.2 - 95.6 %   MCV 83.3 78.0 - 100.0 fL   MCH 27.5 26.0 - 34.0 pg   MCHC 32.9 30.0 - 36.0 g/dL   RDW 21.3 08.6 - 57.8 %   Platelets 208 150 - 400 K/uL  Comprehensive metabolic panel     Status: Abnormal   Collection Time: 08/21/17  5:23 PM  Result Value Ref Range   Sodium 134 (L) 135 - 145 mmol/L   Potassium 3.2 (L) 3.5 - 5.1 mmol/L   Chloride 103 101  - 111 mmol/L   CO2 21 (L) 22 - 32 mmol/L   Glucose, Bld 107 (H) 65 - 99 mg/dL   BUN 9 6 - 20 mg/dL   Creatinine, Ser 4.69 0.44 - 1.00 mg/dL   Calcium 8.7 (L) 8.9 - 10.3 mg/dL   Total Protein 7.1 6.5 - 8.1 g/dL   Albumin 3.2 (L) 3.5 - 5.0 g/dL   AST 17 15 - 41 U/L   ALT 13 (L) 14 - 54 U/L   Alkaline Phosphatase 110 38 - 126  U/L   Total Bilirubin 0.3 0.3 - 1.2 mg/dL   GFR calc non Af Amer >60 >60 mL/min   GFR calc Af Amer >60 >60 mL/min   Anion gap 10 5 - 15       IMAGING No results found.  MAU Management/MDM: Orders Placed This Encounter  Procedures  . CBC  . Comprehensive metabolic panel  . Protein / creatinine ratio, urine  . Discharge patient    No orders of the defined types were placed in this encounter.   ASSESSMENT 1. Elevated BP without diagnosis of hypertension   2. [redacted] weeks gestation of pregnancy     PLAN CBC CMP Protein/Creatinine ratio  Monitor blood pressures   Pre-eclampsia labs wnl, had one elevated blood pressure (141/72) but all other blood pressures normal. Has appointment scheduled already for 08/23/2017.  Discharge home Return precautions given   Allergies as of 08/21/2017   No Known Allergies     Medication List    TAKE these medications   acetaminophen 500 MG tablet Commonly known as:  TYLENOL Take 500 mg by mouth every 6 (six) hours as needed for mild pain or headache.   cephALEXin 500 MG capsule Commonly known as:  KEFLEX Take 1 capsule (500 mg total) by mouth 4 (four) times daily. X 7 days   PRENATAL PLUS IRON 29-1 MG Tabs Take dailu What changed:    how much to take  how to take this  when to take this  additional instructions   valACYclovir 500 MG tablet Commonly known as:  VALTREX Take 1 tablet (500 mg total) by mouth 2 (two) times daily.       Felicie Morn, Medical Student  08/21/2017  7:25 PM

## 2017-08-21 NOTE — MAU Provider Note (Signed)
History     CSN: [id number]  Arrival date and time: 08/21/17 1616   First Provider Initiated Contact with Patient 08/21/17 1652      Chief Complaint  Patient presents with  . Hypertension   HPI  April Tyler is a 27 y.o. [id number] at [redacted]w[redacted]d who presents from the office for blood pressure evaluation. Hx of PP preeclampsia in previous pregnancy; no hypertension otherwise.  Was a work-in appointment at [facility] this afternoon for headache. Reports intermittent headache for the last 2 days. Has been taking tylenol with relief. Currently rates headache 1/10. "thinks" she "might" have blurred vision but symptoms started d/t issues with her contacts and wearing them too long. Denies epigastric pain, abdominal pain, vaginal bleeding, or LOF. Positive fetal movement.   OB History    Gravida  7   Para  3   Term  3   Preterm      AB  3   Living  3     SAB  1   TAB  2   Ectopic      Multiple      Live Births  3           Past Medical History:  Diagnosis Date  . Burning with urination 01/31/2014  . BV (bacterial vaginosis) 01/31/2014  . Contraceptive management 11/29/2013  . Exposure to chlamydia 03/13/2015  . History of elective abortion 11/29/2013  . Induced abortion 08/21/15  . Screening for STD (sexually transmitted disease) 09/11/2015  . UTI (lower urinary tract infection)   . Vaginal discharge 11/29/2013    Past Surgical History:  Procedure Laterality Date  . elective abortion    . elective abortion  08/21/15    Family History  Problem Relation Age of Onset  . Other Father        B 12 Deficiency  . Asthma Daughter   . Hypertension Maternal Grandmother   . Obesity Paternal Grandmother   . Other Sister        B 12 Defficiency  . Hypertension Maternal Aunt     Social History   Tobacco Use  . Smoking status: Never Smoker  . Smokeless tobacco: Never Used  Substance Use Topics  . Alcohol use: No    Comment: occ; not now  . Drug use: No     Allergies: No Known Allergies  Medications Prior to Admission  Medication Sig Dispense Refill Last Dose  . acetaminophen (TYLENOL) 500 MG tablet Take 500 mg by mouth every 6 (six) hours as needed for mild pain or headache.   Past Week at Unknown time  . cephALEXin (KEFLEX) 500 MG capsule Take 1 capsule (500 mg total) by mouth 4 (four) times daily. X 7 days 28 capsule 0 08/20/2017 at Unknown time  . Prenatal Vit-Iron Carbonyl-FA (PRENATAL PLUS IRON) 29-1 MG TABS Take dailu (Patient taking differently: Take 1 tablet by mouth daily. Take dailu) 30 tablet 12 Past Week at Unknown time  . valACYclovir (VALTREX) 500 MG tablet Take 1 tablet (500 mg total) by mouth 2 (two) times daily. 60 tablet 3 08/20/2017 at Unknown time    Review of Systems  Constitutional: Negative.   Gastrointestinal: Negative.   Genitourinary: Negative.   Neurological: Positive for headaches.   Physical Exam   Blood pressure 133/78, pulse 98, resp. rate 16, last menstrual period 11/28/2016. Patient Vitals for the past 24 hrs:  BP Pulse Resp  08/21/17 1833 133/78 98 16  08/21/17 1745 125/67 100 -  08/21/17  1731 125/75 (!) 103 -  08/21/17 1716 135/77 97 -  08/21/17 1701 (!) 141/72 98 -  08/21/17 1645 125/82 (!) 114 -  08/21/17 1635 135/76 (!) 108 -    Physical Exam  Nursing note and vitals reviewed. Constitutional: She is oriented to person, place, and time. She appears well-developed and well-nourished. No distress.  HENT:  Head: Normocephalic and atraumatic.  Eyes: Conjunctivae are normal. Right eye exhibits no discharge. Left eye exhibits no discharge. No scleral icterus.  Neck: Normal range of motion.  Cardiovascular: Normal rate, regular rhythm and normal heart sounds.  No murmur heard. Respiratory: Effort normal and breath sounds normal. No respiratory distress. She has no wheezes.  GI: Soft. There is no tenderness.  Neurological: She is alert and oriented to person, place, and time. She has normal  reflexes.  Skin: Skin is warm and dry. She is not diaphoretic.  Psychiatric: She has a normal mood and affect. Her behavior is normal. Judgment and thought content normal.    MAU Course  Procedures Results for orders placed or performed during the hospital encounter of 08/21/17 (from the past 24 hour(s))  Protein / creatinine ratio, urine     Status: None   Collection Time: 08/21/17  4:28 PM  Result Value Ref Range   Creatinine, Urine 184.00 mg/dL   Total Protein, Urine 28 mg/dL   Protein Creatinine Ratio 0.15 0.00 - 0.15 mg/mg[Cre]  CBC     Status: Abnormal   Collection Time: 08/21/17  5:23 PM  Result Value Ref Range   WBC 8.7 4.0 - 10.5 K/uL   RBC 4.08 3.87 - 5.11 MIL/uL   Hemoglobin 11.2 (L) 12.0 - 15.0 g/dL   HCT 96.034.0 (L) 45.436.0 - 09.846.0 %   MCV 83.3 78.0 - 100.0 fL   MCH 27.5 26.0 - 34.0 pg   MCHC 32.9 30.0 - 36.0 g/dL   RDW 11.913.6 14.711.5 - 82.915.5 %   Platelets 208 150 - 400 K/uL  Comprehensive metabolic panel     Status: Abnormal   Collection Time: 08/21/17  5:23 PM  Result Value Ref Range   Sodium 134 (L) 135 - 145 mmol/L   Potassium 3.2 (L) 3.5 - 5.1 mmol/L   Chloride 103 101 - 111 mmol/L   CO2 21 (L) 22 - 32 mmol/L   Glucose, Bld 107 (H) 65 - 99 mg/dL   BUN 9 6 - 20 mg/dL   Creatinine, Ser 5.620.71 0.44 - 1.00 mg/dL   Calcium 8.7 (L) 8.9 - 10.3 mg/dL   Total Protein 7.1 6.5 - 8.1 g/dL   Albumin 3.2 (L) 3.5 - 5.0 g/dL   AST 17 15 - 41 U/L   ALT 13 (L) 14 - 54 U/L   Alkaline Phosphatase 110 38 - 126 U/L   Total Bilirubin 0.3 0.3 - 1.2 mg/dL   GFR calc non Af Amer >60 >60 mL/min   GFR calc Af Amer >60 >60 mL/min   Anion gap 10 5 - 15    MDM NST:  Baseline: 135 bpm, Variability: Good {> 6 bpm), Accelerations: Reactive and Decelerations: Absent Reactive NST Elevated BP x 1, not severe range PIH labs wnl  Assessment and Plan  A: 1. Elevated BP without diagnosis of hypertension   2. [redacted] weeks gestation of pregnancy    P: Discharge home Discussed reasons to return to  MAU including s/s preeclampsia Keep appt in office on Monday  April Tyler 08/21/2017, 6:41 PM

## 2017-08-23 ENCOUNTER — Ambulatory Visit (INDEPENDENT_AMBULATORY_CARE_PROVIDER_SITE_OTHER): Payer: BLUE CROSS/BLUE SHIELD | Admitting: Advanced Practice Midwife

## 2017-08-23 ENCOUNTER — Encounter: Payer: Self-pay | Admitting: Advanced Practice Midwife

## 2017-08-23 VITALS — BP 120/60 | HR 98 | Wt 274.0 lb

## 2017-08-23 DIAGNOSIS — O9989 Other specified diseases and conditions complicating pregnancy, childbirth and the puerperium: Secondary | ICD-10-CM

## 2017-08-23 DIAGNOSIS — Z3A38 38 weeks gestation of pregnancy: Secondary | ICD-10-CM

## 2017-08-23 DIAGNOSIS — R800 Isolated proteinuria: Secondary | ICD-10-CM | POA: Diagnosis not present

## 2017-08-23 DIAGNOSIS — Z331 Pregnant state, incidental: Secondary | ICD-10-CM

## 2017-08-23 DIAGNOSIS — Z3483 Encounter for supervision of other normal pregnancy, third trimester: Secondary | ICD-10-CM

## 2017-08-23 DIAGNOSIS — Z1389 Encounter for screening for other disorder: Secondary | ICD-10-CM

## 2017-08-23 DIAGNOSIS — N898 Other specified noninflammatory disorders of vagina: Secondary | ICD-10-CM

## 2017-08-23 LAB — POCT URINALYSIS DIPSTICK
Blood, UA: NEGATIVE
Glucose, UA: NEGATIVE
Ketones, UA: NEGATIVE
Leukocytes, UA: NEGATIVE
Nitrite, UA: NEGATIVE
Protein, UA: 1

## 2017-08-23 NOTE — Patient Instructions (Signed)

## 2017-08-23 NOTE — Progress Notes (Signed)
  [id number] Estimated Date of Delivery: 09/04/17  Blood pressure 120/60, pulse 98, weight 274 lb (124.3 kg), last menstrual period 11/28/2016.   BP weight and urine results all reviewed and noted.  Please refer to the obstetrical flow sheet for the fundal height and fetal heart rate documentation:  Patient reports good fetal movement, denies any bleeding and no rupture of membranes symptoms or regular contractions. Patient c/o some vaginal irritaiton All questions were answered.   Physical Assessment:   Vitals:   08/23/17 1151  BP: 120/60  Pulse: 98  Weight: 274 lb (124.3 kg)  Body mass index is 41.66 kg/m.        Physical Examination:   General appearance: Well appearing, and in no distress  Mental status: Alert, oriented to person, place, and time  Skin: Warm & dry  Cardiovascular: Normal heart rate noted  Respiratory: Normal respiratory effort, no distress  Abdomen: Soft, gravid, nontender  Pelvic:D/C c/w yeast.  Cervical exam performed         Extremities: Edema: None  Fetal Status:     Movement: Present    Results for orders placed or performed in visit on 08/23/17 (from the past 24 hour(s))  POCT urinalysis dipstick   Collection Time: 08/23/17 11:58 AM  Result Value Ref Range   Color, UA     Clarity, UA     Glucose, UA neg    Bilirubin, UA     Ketones, UA neg    Spec Grav, UA  1.010 - 1.025   Blood, UA neg    pH, UA  5.0 - 8.0   Protein, UA 1    Urobilinogen, UA  0.2 or 1.0 E.U./dL   Nitrite, UA neg    Leukocytes, UA Negative Negative   Appearance     Odor       Orders Placed This Encounter  Procedures  . Urine Culture  . POCT urinalysis dipstick    Plan:  Continued routine obstetrical care, yeast OTC tx.   Return in about 1 week (around 08/30/2017) for LROB.

## 2017-08-25 LAB — URINE CULTURE

## 2017-08-31 ENCOUNTER — Ambulatory Visit (INDEPENDENT_AMBULATORY_CARE_PROVIDER_SITE_OTHER): Payer: BLUE CROSS/BLUE SHIELD | Admitting: Women's Health

## 2017-08-31 ENCOUNTER — Encounter: Payer: Self-pay | Admitting: Women's Health

## 2017-08-31 VITALS — BP 122/80 | HR 94 | Wt 276.4 lb

## 2017-08-31 DIAGNOSIS — Z3483 Encounter for supervision of other normal pregnancy, third trimester: Secondary | ICD-10-CM

## 2017-08-31 DIAGNOSIS — Z3A39 39 weeks gestation of pregnancy: Secondary | ICD-10-CM

## 2017-08-31 DIAGNOSIS — O480 Post-term pregnancy: Secondary | ICD-10-CM

## 2017-08-31 DIAGNOSIS — Z331 Pregnant state, incidental: Secondary | ICD-10-CM

## 2017-08-31 DIAGNOSIS — Z1389 Encounter for screening for other disorder: Secondary | ICD-10-CM

## 2017-08-31 DIAGNOSIS — B009 Herpesviral infection, unspecified: Secondary | ICD-10-CM

## 2017-08-31 LAB — POCT URINALYSIS DIPSTICK
Glucose, UA: NEGATIVE
Ketones, UA: NEGATIVE
Nitrite, UA: NEGATIVE

## 2017-08-31 NOTE — Progress Notes (Signed)
   LOW-RISK PREGNANCY VISIT Patient name: April Tyler MRN [id number]  Date of birth: 04/29/1991 Chief Complaint:   Routine Prenatal Visit  History of Present Illness:   April Tyler is a 27 y.o. [id number] female at 2274w3d with an Estimated Date of Delivery: 09/04/17 being seen today for ongoing management of a low-risk pregnancy.  Today she reports no complaints. Still undecided if she wants BTL, consent singed 07/19/17.  Contractions: Irregular.  .  Movement: Present. denies leaking of fluid. Review of Systems:   Pertinent items are noted in HPI Denies abnormal vaginal discharge w/ itching/odor/irritation, headaches, visual changes, shortness of breath, chest pain, abdominal pain, severe nausea/vomiting, or problems with urination or bowel movements unless otherwise stated above. Pertinent History Reviewed:  Reviewed past medical,surgical, social, obstetrical and family history.  Reviewed problem list, medications and allergies. Physical Assessment:   Vitals:   08/31/17 1131  BP: 122/80  Pulse: 94  Weight: 276 lb 6.4 oz (125.4 kg)  Body mass index is 42.03 kg/m.        Physical Examination:   General appearance: Well appearing, and in no distress  Mental status: Alert, oriented to person, place, and time  Skin: Warm & dry  Cardiovascular: Normal heart rate noted  Respiratory: Normal respiratory effort, no distress  Abdomen: Soft, gravid, nontender  Pelvic: Cervical exam performed  Dilation: 1.5 Effacement (%): Thick Station: -2  Extremities: Edema: Trace  Fetal Status: Fetal Heart Rate (bpm): 145 Fundal Height: 40 cm Movement: Present Presentation: Vertex  Results for orders placed or performed in visit on 08/31/17 (from the past 24 hour(s))  POCT urinalysis dipstick   Collection Time: 08/31/17 11:37 AM  Result Value Ref Range   Color, UA     Clarity, UA     Glucose, UA neg    Bilirubin, UA     Ketones, UA neg    Spec Grav, UA  1.010 - 1.025   Blood, UA small    pH, UA   5.0 - 8.0   Protein, UA trace    Urobilinogen, UA  0.2 or 1.0 E.U./dL   Nitrite, UA neg    Leukocytes, UA Moderate (2+) (A) Negative   Appearance     Odor      Assessment & Plan:  1) Low-risk pregnancy [id number] at 7274w3d with an Estimated Date of Delivery: 09/04/17     Meds: No orders of the defined types were placed in this encounter.  Labs/procedures today: SVE  Plan:  Continue routine obstetrical care, bpp next week for postdates  Reviewed: Term labor symptoms and general obstetric precautions including but not limited to vaginal bleeding, contractions, leaking of fluid and fetal movement were reviewed in detail with the patient.  All questions were answered  Follow-up: Return in about 1 week (around 09/07/2017) for LROB, US:BPP.  Orders Placed This Encounter  Procedures  . US FETAL BPP WO NON STRESS  . POCT urinalysis dipstick   Cheral MarkerKimberly R Kanna Dafoe CNM, [facility]NP-BC 08/31/2017 11:52 AM

## 2017-08-31 NOTE — Patient Instructions (Signed)
Jillann A Paynter, I greatly value your feedback.  If you receive a survey following your visit with us today, we appreciate you taking the time to fill it out.  Thanks, Joellyn HaffKim Darion Milewski, CNM, WHNP-BC   Call the office [phone]) or go to [facility] if:  You begin to have strong, frequent contractions  Your water breaks.  Sometimes it is a big gush of fluid, sometimes it is just a trickle that keeps getting your panties wet or running down your legs  You have vaginal bleeding.  It is normal to have a small amount of spotting if your cervix was checked.   You don't feel your baby moving like normal.  If you don't, get you something to eat and drink and lay down and focus on feeling your baby move.  You should feel at least 10 movements in 2 hours.  If you don't, you should call the office or go to [facility].     [facility] PBraxton Hicks Contractions Contractions of the uterus can occur throughout pregnancy, but they are not always a sign that you are in labor. You may have practice contractions called Braxton Hicks contractions. These false labor contractions are sometimes confused with true labor. What are Deberah PeltonBraxton Hicks contractions? Braxton Hicks contractions are tightening movements that occur in the muscles of the uterus before labor. Unlike true labor contractions, these contractions do not result in opening (dilation) and thinning of the cervix. Toward the end of pregnancy (32-34 weeks), Braxton Hicks contractions can happen more often and may become stronger. These contractions are sometimes difficult to tell apart from true labor because they can be very uncomfortable. You should not feel embarrassed if you go to the hospital with false labor. Sometimes, the only way to tell if you are in true labor is for your health care provider to look for changes in the cervix. The health care provider will do a physical exam and may monitor your contractions. If you are not in true labor, the exam should  show that your cervix is not dilating and your water has not broken. If there are other health problems associated with your pregnancy, it is completely safe for you to be sent home with false labor. You may continue to have Braxton Hicks contractions until you go into true labor. How to tell the difference between true labor and false labor True labor  Contractions last 30-70 seconds.  Contractions become very regular.  Discomfort is usually felt in the top of the uterus, and it spreads to the lower abdomen and low back.  Contractions do not go away with walking.  Contractions usually become more intense and increase in frequency.  The cervix dilates and gets thinner. False labor  Contractions are usually shorter and not as strong as true labor contractions.  Contractions are usually irregular.  Contractions are often felt in the front of the lower abdomen and in the groin.  Contractions may go away when you walk around or change positions while lying down.  Contractions get weaker and are shorter-lasting as time goes on.  The cervix usually does not dilate or become thin. Follow these instructions at home:  Take over-the-counter and prescription medicines only as told by your health care provider.  Keep up with your usual exercises and follow other instructions from your health care provider.  Eat and drink lightly if you think you are going into labor.  If Braxton Hicks contractions are making you uncomfortable: ? Change your position from lying  down or resting to walking, or change from walking to resting. ? Sit and rest in a tub of warm water. ? Drink enough fluid to keep your urine pale yellow. Dehydration may cause these contractions. ? Do slow and deep breathing several times an hour.  Keep all follow-up prenatal visits as told by your health care provider. This is important. Contact a health care provider if:  You have a fever.  You have continuous pain in  your abdomen. Get help right away if:  Your contractions become stronger, more regular, and closer together.  You have fluid leaking or gushing from your vagina.  You pass blood-tinged mucus (bloody show).  You have bleeding from your vagina.  You have low back pain that you never had before.  You feel your baby's head pushing down and causing pelvic pressure.  Your baby is not moving inside you as much as it used to. Summary  Contractions that occur before labor are called Braxton Hicks contractions, false labor, or practice contractions.  Braxton Hicks contractions are usually shorter, weaker, farther apart, and less regular than true labor contractions. True labor contractions usually become progressively stronger and regular and they become more frequent.  Manage discomfort from [facility] contractions by changing position, resting in a warm bath, drinking plenty of water, or practicing deep breathing. This information is not intended to replace advice given to you by your health care provider. Make sure you discuss any questions you have with your health care provider. Document Released: 09/22/2016 Document Revised: 09/22/2016 Document Reviewed: 09/22/2016 Elsevier Interactive Patient Education  2018 [employer].

## 2017-09-02 ENCOUNTER — Inpatient Hospital Stay (HOSPITAL_COMMUNITY)
Admission: AD | Admit: 2017-09-02 | Discharge: 2017-09-05 | DRG: 806 | Disposition: A | Discharge status: Home / Self Care | Payer: BLUE CROSS/BLUE SHIELD | Source: Ambulatory Visit | Attending: Obstetrics & Gynecology | Admitting: Obstetrics & Gynecology

## 2017-09-02 ENCOUNTER — Encounter (HOSPITAL_COMMUNITY): Payer: Self-pay | Admitting: *Deleted

## 2017-09-02 DIAGNOSIS — A6000 Herpesviral infection of urogenital system, unspecified: Secondary | ICD-10-CM | POA: Diagnosis present

## 2017-09-02 DIAGNOSIS — O99824 Streptococcus B carrier state complicating childbirth: Principal | ICD-10-CM | POA: Diagnosis present

## 2017-09-02 DIAGNOSIS — Z3483 Encounter for supervision of other normal pregnancy, third trimester: Secondary | ICD-10-CM

## 2017-09-02 DIAGNOSIS — Z3A39 39 weeks gestation of pregnancy: Secondary | ICD-10-CM

## 2017-09-02 DIAGNOSIS — O9832 Other infections with a predominantly sexual mode of transmission complicating childbirth: Secondary | ICD-10-CM | POA: Diagnosis present

## 2017-09-02 NOTE — MAU Note (Signed)
Ctx since 1700. Denies LOF or bleeding. 1.5cm last sve

## 2017-09-03 ENCOUNTER — Other Ambulatory Visit: Payer: Self-pay

## 2017-09-03 ENCOUNTER — Encounter (HOSPITAL_COMMUNITY): Payer: Self-pay

## 2017-09-03 ENCOUNTER — Inpatient Hospital Stay (HOSPITAL_COMMUNITY): Payer: BLUE CROSS/BLUE SHIELD | Admitting: Anesthesiology

## 2017-09-03 DIAGNOSIS — A6000 Herpesviral infection of urogenital system, unspecified: Secondary | ICD-10-CM | POA: Diagnosis not present

## 2017-09-03 DIAGNOSIS — Z3483 Encounter for supervision of other normal pregnancy, third trimester: Secondary | ICD-10-CM | POA: Diagnosis not present

## 2017-09-03 DIAGNOSIS — Z3A39 39 weeks gestation of pregnancy: Secondary | ICD-10-CM | POA: Diagnosis not present

## 2017-09-03 DIAGNOSIS — O99824 Streptococcus B carrier state complicating childbirth: Secondary | ICD-10-CM | POA: Diagnosis not present

## 2017-09-03 DIAGNOSIS — O9832 Other infections with a predominantly sexual mode of transmission complicating childbirth: Secondary | ICD-10-CM | POA: Diagnosis not present

## 2017-09-03 LAB — CBC
HCT: 35 % — ABNORMAL LOW (ref 36.0–46.0)
Hemoglobin: 11.4 g/dL — ABNORMAL LOW (ref 12.0–15.0)
MCH: 27.4 pg (ref 26.0–34.0)
MCHC: 32.6 g/dL (ref 30.0–36.0)
MCV: 84.1 fL (ref 78.0–100.0)
Platelets: 189 10*3/uL (ref 150–400)
RBC: 4.16 MIL/uL (ref 3.87–5.11)
RDW: 13.8 % (ref 11.5–15.5)
WBC: 12 10*3/uL — ABNORMAL HIGH (ref 4.0–10.5)

## 2017-09-03 LAB — ABO/RH: ABO/RH(D): A POS

## 2017-09-03 LAB — TYPE AND SCREEN
ABO/RH(D): A POS
Antibody Screen: NEGATIVE

## 2017-09-03 LAB — HEPATITIS B SURFACE ANTIGEN: Hepatitis B Surface Ag: NEGATIVE

## 2017-09-03 LAB — RPR: RPR Ser Ql: NONREACTIVE

## 2017-09-03 MED ORDER — LACTATED RINGERS IV SOLN
500.0000 mL | Freq: Once | INTRAVENOUS | Status: AC
  Administered 2017-09-03: 500 mL via INTRAVENOUS

## 2017-09-03 MED ORDER — DIPHENHYDRAMINE HCL 50 MG/ML IJ SOLN: 12.5000 mg | INTRAMUSCULAR | Status: DC | PRN

## 2017-09-03 MED ORDER — LACTATED RINGERS IV SOLN
INTRAVENOUS | Status: DC
  Administered 2017-09-03: 01:00:00 via INTRAVENOUS

## 2017-09-03 MED ORDER — EPHEDRINE 5 MG/ML INJ
10.0000 mg | INTRAVENOUS | Status: DC | PRN
Start: 2017-09-03 — End: 2017-09-03

## 2017-09-03 MED ORDER — SIMETHICONE 80 MG PO CHEW: 80.0000 mg | CHEWABLE_TABLET | ORAL | Status: DC | PRN

## 2017-09-03 MED ORDER — EPHEDRINE 5 MG/ML INJ
10.0000 mg | INTRAVENOUS | Status: DC | PRN
  Filled 2017-09-03: qty 2

## 2017-09-03 MED ORDER — ONDANSETRON HCL 4 MG/2ML IJ SOLN: 4.0000 mg | Freq: Four times a day (QID) | INTRAMUSCULAR | Status: DC | PRN

## 2017-09-03 MED ORDER — PHENYLEPHRINE 40 MCG/ML (10ML) SYRINGE FOR IV PUSH (FOR BLOOD PRESSURE SUPPORT): 80.0000 ug | PREFILLED_SYRINGE | INTRAVENOUS | Status: DC | PRN

## 2017-09-03 MED ORDER — PHENYLEPHRINE 40 MCG/ML (10ML) SYRINGE FOR IV PUSH (FOR BLOOD PRESSURE SUPPORT)
80.0000 ug | PREFILLED_SYRINGE | INTRAVENOUS | Status: DC | PRN
Start: 2017-09-03 — End: 2017-09-03

## 2017-09-03 MED ORDER — LIDOCAINE HCL (PF) 1 % IJ SOLN
INTRAMUSCULAR | Status: DC | PRN
  Administered 2017-09-03: 4 mL via EPIDURAL

## 2017-09-03 MED ORDER — PENICILLIN G POT IN DEXTROSE 60000 UNIT/ML IV SOLN
3.0000 10*6.[IU] | INTRAVENOUS | Status: DC
  Filled 2017-09-03 (×3): qty 50

## 2017-09-03 MED ORDER — OXYTOCIN 40 UNITS IN LACTATED RINGERS INFUSION - SIMPLE MED
2.5000 [IU]/h | INTRAVENOUS | Status: DC
  Administered 2017-09-03: 2.5 [IU]/h via INTRAVENOUS
  Filled 2017-09-03: qty 1000

## 2017-09-03 MED ORDER — OXYCODONE-ACETAMINOPHEN 5-325 MG PO TABS: 1.0000 | ORAL_TABLET | ORAL | Status: DC | PRN

## 2017-09-03 MED ORDER — LACTATED RINGERS IV SOLN
500.0000 mL | INTRAVENOUS | Status: DC | PRN
  Administered 2017-09-03: 500 mL via INTRAVENOUS

## 2017-09-03 MED ORDER — EPHEDRINE 5 MG/ML INJ: 10.0000 mg | INTRAVENOUS | Status: DC | PRN

## 2017-09-03 MED ORDER — LACTATED RINGERS IV SOLN: 500.0000 mL | Freq: Once | INTRAVENOUS | Status: DC

## 2017-09-03 MED ORDER — PRENATAL MULTIVITAMIN CH
1.0000 | ORAL_TABLET | Freq: Every day | ORAL | Status: DC
  Administered 2017-09-03 – 2017-09-04 (×2): 1 via ORAL
  Filled 2017-09-03 (×2): qty 1

## 2017-09-03 MED ORDER — FLEET ENEMA 7-19 GM/118ML RE ENEM: 1.0000 | ENEMA | RECTAL | Status: DC | PRN

## 2017-09-03 MED ORDER — FENTANYL CITRATE (PF) 100 MCG/2ML IJ SOLN: 50.0000 ug | INTRAMUSCULAR | Status: DC | PRN

## 2017-09-03 MED ORDER — PHENYLEPHRINE 40 MCG/ML (10ML) SYRINGE FOR IV PUSH (FOR BLOOD PRESSURE SUPPORT)
80.0000 ug | PREFILLED_SYRINGE | INTRAVENOUS | Status: DC | PRN
  Filled 2017-09-03: qty 10
  Filled 2017-09-03: qty 5

## 2017-09-03 MED ORDER — OXYCODONE-ACETAMINOPHEN 5-325 MG PO TABS: 2.0000 | ORAL_TABLET | ORAL | Status: DC | PRN

## 2017-09-03 MED ORDER — FENTANYL 2.5 MCG/ML BUPIVACAINE 1/10 % EPIDURAL INFUSION (WH - ANES)
14.0000 mL/h | INTRAMUSCULAR | Status: DC | PRN
  Administered 2017-09-03: 14 mL/h via EPIDURAL
  Filled 2017-09-03: qty 100

## 2017-09-03 MED ORDER — ONDANSETRON HCL 4 MG PO TABS: 4.0000 mg | ORAL_TABLET | ORAL | Status: DC | PRN

## 2017-09-03 MED ORDER — FENTANYL 2.5 MCG/ML BUPIVACAINE 1/10 % EPIDURAL INFUSION (WH - ANES): 14.0000 mL/h | INTRAMUSCULAR | Status: DC | PRN

## 2017-09-03 MED ORDER — SOD CITRATE-CITRIC ACID 500-334 MG/5ML PO SOLN: 30.0000 mL | ORAL | Status: DC | PRN

## 2017-09-03 MED ORDER — TETANUS-DIPHTH-ACELL PERTUSSIS 5-2.5-18.5 LF-MCG/0.5 IM SUSP: 0.5000 mL | Freq: Once | INTRAMUSCULAR | Status: DC

## 2017-09-03 MED ORDER — SENNOSIDES-DOCUSATE SODIUM 8.6-50 MG PO TABS
2.0000 | ORAL_TABLET | ORAL | Status: DC
Start: 2017-09-04 — End: 2017-09-05
  Administered 2017-09-03 – 2017-09-04 (×2): 2 via ORAL
  Filled 2017-09-03 (×2): qty 2

## 2017-09-03 MED ORDER — ZOLPIDEM TARTRATE 5 MG PO TABS: 5.0000 mg | ORAL_TABLET | Freq: Every evening | ORAL | Status: DC | PRN

## 2017-09-03 MED ORDER — ONDANSETRON HCL 4 MG/2ML IJ SOLN: 4.0000 mg | INTRAMUSCULAR | Status: DC | PRN

## 2017-09-03 MED ORDER — BENZOCAINE-MENTHOL 20-0.5 % EX AERO: 1.0000 "application " | INHALATION_SPRAY | CUTANEOUS | Status: DC | PRN

## 2017-09-03 MED ORDER — SODIUM CHLORIDE 0.9 % IV SOLN
5.0000 10*6.[IU] | Freq: Once | INTRAVENOUS | Status: AC
  Administered 2017-09-03: 5 10*6.[IU] via INTRAVENOUS
  Filled 2017-09-03: qty 5

## 2017-09-03 MED ORDER — LIDOCAINE HCL (PF) 1 % IJ SOLN
30.0000 mL | INTRAMUSCULAR | Status: DC | PRN
  Filled 2017-09-03: qty 30

## 2017-09-03 MED ORDER — OXYTOCIN BOLUS FROM INFUSION
500.0000 mL | Freq: Once | INTRAVENOUS | Status: AC
  Administered 2017-09-03: 500 mL via INTRAVENOUS

## 2017-09-03 MED ORDER — IBUPROFEN 600 MG PO TABS
600.0000 mg | ORAL_TABLET | Freq: Four times a day (QID) | ORAL | Status: DC
  Administered 2017-09-03 – 2017-09-05 (×9): 600 mg via ORAL
  Filled 2017-09-03 (×9): qty 1

## 2017-09-03 MED ORDER — WITCH HAZEL-GLYCERIN EX PADS: 1.0000 "application " | MEDICATED_PAD | CUTANEOUS | Status: DC | PRN

## 2017-09-03 MED ORDER — DIPHENHYDRAMINE HCL 25 MG PO CAPS: 25.0000 mg | ORAL_CAPSULE | Freq: Four times a day (QID) | ORAL | Status: DC | PRN

## 2017-09-03 MED ORDER — ACETAMINOPHEN 325 MG PO TABS: 650.0000 mg | ORAL_TABLET | ORAL | Status: DC | PRN

## 2017-09-03 MED ORDER — PHENYLEPHRINE 40 MCG/ML (10ML) SYRINGE FOR IV PUSH (FOR BLOOD PRESSURE SUPPORT)
80.0000 ug | PREFILLED_SYRINGE | INTRAVENOUS | Status: DC | PRN
  Filled 2017-09-03: qty 5
  Filled 2017-09-03: qty 10

## 2017-09-03 MED ORDER — COCONUT OIL OIL: 1.0000 "application " | TOPICAL_OIL | Status: DC | PRN

## 2017-09-03 MED ORDER — DIBUCAINE 1 % RE OINT: 1.0000 "application " | TOPICAL_OINTMENT | RECTAL | Status: DC | PRN

## 2017-09-03 MED ORDER — ACETAMINOPHEN 325 MG PO TABS
650.0000 mg | ORAL_TABLET | ORAL | Status: DC | PRN
  Administered 2017-09-03 – 2017-09-04 (×4): 650 mg via ORAL
  Filled 2017-09-03 (×4): qty 2

## 2017-09-03 NOTE — H&P (Addendum)
LABOR AND DELIVERY ADMISSION HISTORY AND PHYSICAL NOTE  April Tyler is a 27 y.o. female [phone] with IUP at [redacted]w[redacted]d by LMP and 15wk U/S presenting for SOL. Onset around 1700 on 09/02/17.  Having regular contractions q3 min. She reports positive fetal movement. She denies leakage of fluid or vaginal bleeding.  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 Burning with urination 01/31/2014  . BV (bacterial vaginosis) 01/31/2014  . Contraceptive management 11/29/2013  . Exposure to chlamydia 03/13/2015  . History of elective abortion 11/29/2013  . Induced abortion 08/21/15  . Screening for STD (sexually transmitted disease) 09/11/2015  . UTI (lower urinary tract infection)   . Vaginal discharge 11/29/2013    Past Surgical History: Past Surgical History:  Procedure Laterality Date  . elective abortion    . elective abortion  08/21/15    Obstetrical History: OB History    Gravida  7   Para  3   Term  3   Preterm      AB  3   Living  3     SAB  1   TAB  2   Ectopic      Multiple      Live Births  3           Social History: Social History   Socioeconomic History  . Marital status: Single    Spouse name: Not on file  . Number of children: Not on file  . Years of education: Not on file  . Highest education level: Not on file  Occupational History  . Not on file  Social Needs  . Financial resource strain: Not on file  . Food insecurity:    Worry: Not on file    Inability: Not on file  . Transportation needs:    Medical: Not on file    Non-medical: Not on file  Tobacco Use  . Smoking status: Never Smoker  . Smokeless tobacco: Never Used  Substance and Sexual Activity  . Alcohol use: No    Comment: occ; not now  . Drug use: No  . Sexual activity: Yes    Birth control/protection: None   Lifestyle  . Physical activity:    Days per week: Not on file    Minutes per session: Not on file  . Stress: Not on file  Relationships  . Social connections:    Talks on phone: Not on file    Gets together: Not on file    Attends religious service: Not on file    Active member of club or organization: Not on file    Attends meetings of clubs or organizations: Not on file    Relationship status: Not on file  Other Topics Concern  . Not on file  Social History Narrative  . Not on file    Family History: Family History  Problem Relation Age of Onset  . Other Father        B 12 Deficiency  . Asthma Daughter   . Hypertension Maternal Grandmother   . Obesity Paternal Grandmother   . Other Sister        B 12 Defficiency  . Hypertension Maternal Aunt     Allergies: No Known Allergies  Medications Prior to Admission  Medication Sig Dispense Refill Last Dose  . acetaminophen (TYLENOL) 500 MG tablet Take 500 mg  by mouth every 6 (six) hours as needed for mild pain or headache.   09/01/2017 at Unknown time  . Prenatal Vit-Iron Carbonyl-FA (PRENATAL PLUS IRON) 29-1 MG TABS Take dailu (Patient taking differently: Take 1 tablet by mouth daily. Take dailu) 30 tablet 12 09/02/2017 at Unknown time  . valACYclovir (VALTREX) 500 MG tablet Take 1 tablet (500 mg total) by mouth 2 (two) times daily. 60 tablet 3 09/02/2017 at Unknown time  . cephALEXin (KEFLEX) 500 MG capsule Take 1 capsule (500 mg total) by mouth 4 (four) times daily. X 7 days (Patient not taking: Reported on 08/31/2017) 28 capsule 0 Not Taking    Review of Systems  All systems reviewed and negative except as stated in HPI  Physical Exam Blood pressure 131/70, pulse 93, temperature 98.5 F (36.9 C), resp. rate 18, height 5\' 8"  (1.727 m), weight 274 lb (124.3 kg), last menstrual period 11/28/2016. General appearance: alert, oriented, NAD Lungs: normal respiratory effort Heart: regular rate Abdomen: soft, non-tender; gravid,  FH appropriate for GA Extremities: No calf swelling or tenderness Presentation: cephalic Fetal monitoring: 150 bpm, moderate variability, accelerations present, no decelerations Uterine activity: regular every 3-4 minutes Dilation: 4 Effacement (%): 80 Station: -2 Exam by:: K.Wilson,RN  Prenatal labs: ABO, Rh: A positive Antibody: Negative (01/30 0912) Rubella:  UNKNOWN RPR: Non Reactive (01/30 0912)  HBsAg:   UNKNOWN HIV: Non Reactive (01/30 0912)  GC/Chlamydia: negative GBS: positive urine 2-hr GTT: WNL, 80/111/87 Genetic screening:  declined Anatomy US: WNL  Prenatal Transfer Tool  Maternal Diabetes: No Genetic Screening: Declined Maternal Ultrasounds/Referrals: Normal Fetal Ultrasounds or other Referrals:  None Maternal Substance Abuse:  No Significant Maternal Medications:  None Significant Maternal Lab Results: Lab values include: Group B Strep positive  No results found for this or any previous visit (from the past 24 hour(s)).  Patient Active Problem List   Diagnosis Date Noted  . GBS bacteriuria 05/11/2017  . Supervision of normal pregnancy 04/10/2017  . Late prenatal care affecting pregnancy, antepartum 04/10/2017  . 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 History of postpartum hypertension 04/10/2017  . Miscarriage 12/22/2015  . BV (bacterial vaginosis) 01/31/2014  . Lower urinary tract infectious disease 01/31/2014  . History of elective abortion 11/29/2013    Assessment: April Tyler is a 27 y.o. [id number] at 5561w6d here for SOL. 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 Will admit for anticipated normal labor.  #Labor: stage 1, expectant management #Pain: IV pain meds PRN, epidural upon request #FWB: category 1 #ID: GBS positive, will give PCN #MOF: bottle #MOC: Nexplanon #Circ:  N/A  Wendee BeaversDavid J McMullen 09/03/2017, 12:29 AM  OB FELLOW HISTORY AND PHYSICAL ATTESTATION  I confirm that I have verified the  information documented in the resident's note and that I have also personally reperformed the physical exam and all medical decision making activities. I agree with above documentation and have made edits as needed.   Caryl AdaJazma Phelps, DO OB Fellow 09/03/2017, 1:31 AM

## 2017-09-03 NOTE — Progress Notes (Signed)
Labor Progress Note  April Tyler is a 27 y.o. [id number] at 583w6d  admitted for active labor  S: Doing well. No concerns.  O:  BP 101/82   Pulse 93   Temp 98.6 F (37 C) (Oral)   Resp 18   Ht 5\' 8"  (1.727 m)   Wt 274 lb (124.3 kg)   LMP 11/28/2016 (Exact Date)   SpO2 99%   BMI 41.66 kg/m   No intake/output data recorded.  FHT:  FHR: 140 bpm, variability: moderate,  accelerations:  Present,  decelerations:  Absent UC:   irregular, every 1-5 minutes SVE:   Dilation: 8 Effacement (%): 90 Station: -1 Exam by:: Dr. Doroteo GlassmanPhelps  AROM: clear  Labs: Lab Results  Component Value Date   WBC 12.0 (H) 09/03/2017   HGB 11.4 (L) 09/03/2017   HCT 35.0 (L) 09/03/2017   MCV 84.1 09/03/2017   PLT 189 09/03/2017    Assessment / Plan: 27 y.o. [id number] 493w6d in active labor Spontaneous labor, progressing normally  Labor: Progressing normally, AROM 0315 Fetal Wellbeing:  Category I Pain Control:  Epidural Anticipated MOD:  NSVD  Expectant management   Caryl AdaJazma Iretta Mangrum, DO OB Fellow [facility], [facility]

## 2017-09-03 NOTE — Anesthesia Postprocedure Evaluation (Signed)
Anesthesia Post Note  Patient: April Tyler  Procedure(s) Performed: AN AD HOC LABOR EPIDURAL     Patient location during evaluation: Mother Baby Anesthesia Type: Epidural Level of consciousness: awake and alert and oriented Pain management: satisfactory to patient Vital Signs Assessment: post-procedure vital signs reviewed and stable Respiratory status: respiratory function stable Cardiovascular status: stable Postop Assessment: no headache, no backache, epidural receding, patient able to bend at knees, no signs of nausea or vomiting and adequate PO intake Anesthetic complications: no    Last Vitals:  Vitals:   09/03/17 0718 09/03/17 1139  BP: 118/62 (!) 123/58  Pulse: 77 74  Resp: 18 18  Temp: 36.9 C 37 C  SpO2: 96%     Last Pain:  Vitals:   09/03/17 1140  TempSrc:   PainSc: 0-No pain   Pain Goal: Patients Stated Pain Goal: 0 (09/02/17 2250)               Chaska Hagger

## 2017-09-03 NOTE — Anesthesia Procedure Notes (Signed)
Epidural Patient location during procedure: OB Start time: 09/03/2017 1:24 AM End time: 09/03/2017 1:30 AM  Staffing Anesthesiologist: Shelton SilvasHollis, Nariya Neumeyer D, MD Performed: anesthesiologist   Preanesthetic Checklist Completed: patient identified, site marked, surgical consent, pre-op evaluation, timeout performed, IV checked, risks and benefits discussed and monitors and equipment checked  Epidural Patient position: sitting Prep: ChloraPrep Patient monitoring: heart rate, continuous pulse ox and blood pressure Approach: midline Location: L3-L4 Injection technique: LOR saline  Needle:  Needle type: Tuohy  Needle gauge: 17 G Needle length: 9 cm Catheter type: closed end flexible Catheter size: 20 Guage Test dose: negative and 1.5% lidocaine  Assessment Events: blood not aspirated, injection not painful, no injection resistance and no paresthesia  Additional Notes LOR @ 6  Patient identified. Risks/Benefits/Options discussed with patient including but not limited to bleeding, infection, nerve damage, paralysis, failed block, incomplete pain control, headache, blood pressure changes, nausea, vomiting, reactions to medications, itching and postpartum back pain. Confirmed with bedside nurse the patient's most recent platelet count. Confirmed with patient that they are not currently taking any anticoagulation, have any bleeding history or any family history of bleeding disorders. Patient expressed understanding and wished to proceed. All questions were answered. Sterile technique was used throughout the entire procedure. Please see nursing notes for vital signs. Test dose was given through epidural catheter and negative prior to continuing to dose epidural or start infusion. Warning signs of high block given to the patient including shortness of breath, tingling/numbness in hands, complete motor block, or any concerning symptoms with instructions to call for help. Patient was given instructions on  fall risk and not to get out of bed. All questions and concerns addressed with instructions to call with any issues or inadequate analgesia.    Reason for block:procedure for pain

## 2017-09-03 NOTE — Anesthesia Preprocedure Evaluation (Signed)
Anesthesia Evaluation  Patient identified by MRN, date of birth, ID band Patient awake    Reviewed: Allergy & Precautions, Patient's Chart, lab work & pertinent test results  Airway Mallampati: II       Dental no notable dental hx.    Pulmonary neg pulmonary ROS,    Pulmonary exam normal        Cardiovascular Normal cardiovascular exam     Neuro/Psych negative neurological ROS  negative psych ROS   GI/Hepatic Neg liver ROS,   Endo/Other    Renal/GU      Musculoskeletal   Abdominal   Peds  Hematology negative hematology ROS (+)   Anesthesia Other Findings   Reproductive/Obstetrics (+) Pregnancy                             Anesthesia Physical Anesthesia Plan  ASA: III  Anesthesia Plan: Epidural   Post-op Pain Management:    Induction:   PONV Risk Score and Plan:   Airway Management Planned:   Additional Equipment:   Intra-op Plan:   Post-operative Plan:   Informed Consent: I have reviewed the patients History and Physical, chart, labs and discussed the procedure including the risks, benefits and alternatives for the proposed anesthesia with the patient or authorized representative who has indicated his/her understanding and acceptance.     Plan Discussed with:   Anesthesia Plan Comments: (Lab Results      Component                Value               Date                      WBC                      12.0 (H)            09/03/2017                HGB                      11.4 (L)            09/03/2017                HCT                      35.0 (L)            09/03/2017                MCV                      84.1                09/03/2017                PLT                      189                 09/03/2017           )        Anesthesia Quick Evaluation

## 2017-09-04 LAB — RUBELLA SCREEN: Rubella: 10.9 index (ref >0.99)

## 2017-09-04 NOTE — Progress Notes (Signed)
Post Partum Day 1 Subjective: no complaints, up ad lib, voiding and tolerating PO  Objective: Blood pressure 117/74, pulse 75, temperature 98.2 F (36.8 C), temperature source Oral, resp. rate 20, height 5\' 8"  (1.727 m), weight 274 lb (124.3 kg), last menstrual period 11/28/2016, SpO2 98 %, unknown if currently breastfeeding.  Physical Exam:  General: alert, cooperative and no distress Lochia: appropriate Uterine Fundus: firm DVT Evaluation: No evidence of DVT seen on physical exam.  Recent Labs    09/03/17 0027  HGB 11.4*  HCT 35.0*    Assessment/Plan: Plan for discharge tomorrow  Continue current care   LOS: 1 day   Caryl AdaJazma Etana Beets, DO 09/04/2017, 9:39 AM

## 2017-09-04 NOTE — Progress Notes (Signed)
Post Partum Day 1 Subjective: no complaints, up ad lib, voiding and tolerating PO  Objective: Blood pressure 117/74, pulse 75, temperature 98.2 F (36.8 C), temperature source Oral, resp. rate 20, height 5\' 8"  (1.727 m), weight 124.3 kg (274 lb), last menstrual period 11/28/2016, SpO2 98 %, unknown if currently breastfeeding.  Physical Exam:  General: alert, cooperative and no distress Lochia: appropriate Uterine Fundus: firm DVT Evaluation: No evidence of DVT seen on physical exam.  Recent Labs    09/03/17 0027  HGB 11.4*  HCT 35.0*    Assessment/Plan: Plan for discharge tomorrow and Contraception Nexplanon   LOS: 1 day   Beverly SessionsGrant G Korynne Dols 09/04/2017, 9:11 AM

## 2017-09-04 NOTE — Lactation Note (Signed)
This note was copied from a baby's chart. Lactation Consultation Note  Patient Name: Girl Redge GainerKourtney Brawn [id number]'[id number]'s Date: 09/04/2017   Per Ludger Nuttingevon RN mother wants to breastfeed. Upon entering baby is sucking pacifier with visitor holding baby. Suggest trying to breastfeed if baby is willing to suck on pacifier but mother wants to wait to try. Mom has my # to call for assist w/next feeding.      Maternal Data    Feeding Feeding Type: Bottle Fed - Formula Nipple Type: Slow - flow  LATCH Score                   Interventions    Lactation Tools Discussed/Used     Consult Status      Hardie PulleyBerkelhammer, Kathyleen Radice Boschen 09/04/2017, 10:38 AM

## 2017-09-05 MED ORDER — IBUPROFEN 600 MG PO TABS: 600.0000 mg | ORAL_TABLET | Freq: Four times a day (QID) | ORAL | 0 refills | Status: DC

## 2017-09-05 NOTE — Discharge Summary (Addendum)
OB Discharge Summary     Patient Name: April Tyler DOB: 06/30/1990 MRN: [id number]  Date of admission: 09/02/2017 Delivering MD: April Tyler   Date of discharge: 09/05/2017  Admitting diagnosis: contractions Intrauterine pregnancy: [id number]     Secondary diagnosis:  Active Problems:   SVD (spontaneous vaginal delivery)  Additional problems: HSV-2 suppressed with valtrex GBS bacteiruia History of postpartum hypertension     Discharge diagnosis: Term Pregnancy Delivered                                                                                                Post partum procedures:none  Augmentation: AROM  Complications: None  Hospital course:  Onset of Labor With Vaginal Delivery     27 y.o. yo [id number] at [id number] was admitted in Active Labor on 09/02/2017. Patient had an uncomplicated labor course as follows:  Membrane Rupture Time/Date: 3:15 AM ,09/03/2017   Intrapartum Procedures: Episiotomy: None [1]                                         Lacerations:  None [1]  Patient had a delivery of a Viable infant. 09/03/2017  Information for the patient's newborn:  April Tyler [[id number]][[id number]]  Delivery Method: Vaginal, Spontaneous(Filed from Delivery Summary)    Pateint had an uncomplicated postpartum course.  She is ambulating, tolerating a regular diet, passing flatus, and urinating well. Patient is discharged home in stable condition on 09/05/17.   Physical exam  Vitals:   09/03/17 1812 09/04/17 0509 09/04/17 1848 09/05/17 0549  BP: (!) 112/58 117/74 (!) 118/98 121/74  Pulse: 75 75 60 77  Resp: 17 20 18 18   Temp: 98.5 F (36.9 C) 98.2 F (36.8 C) 97.9 F (36.6 C) 98.1 F (36.7 C)  TempSrc: Oral Oral Oral Oral  SpO2: 99% 98%    Weight:      Height:       General: alert, cooperative and no distress Lochia: appropriate Uterine Fundus: firm Incision: N/A DVT Evaluation: No evidence of DVT seen on physical exam. Labs: Lab Results  Component Value Date    WBC 12.0 (H) 09/03/2017   HGB 11.4 (L) 09/03/2017   HCT 35.0 (L) 09/03/2017   MCV 84.1 09/03/2017   PLT 189 09/03/2017   CMP Latest Ref Rng & Units 08/21/2017  Glucose 65 - 99 mg/dL 130(Q107(H)  BUN 6 - 20 mg/dL 9  Creatinine 6.570.44 - 8.461.00 mg/dL 9.620.71  Sodium 952135 - 841145 mmol/L 134(L)  Potassium 3.5 - 5.1 mmol/L 3.2(L)  Chloride 101 - 111 mmol/L 103  CO2 22 - 32 mmol/L 21(L)  Calcium 8.9 - 10.3 mg/dL 3.2(G8.7(L)  Total Protein 6.5 - 8.1 g/dL 7.1  Total Bilirubin 0.3 - 1.2 mg/dL 0.3  Alkaline Phos 38 - 126 U/L 110  AST 15 - 41 U/L 17  ALT 14 - 54 U/L 13(L)    Discharge instruction: per After Visit Summary and "Baby and Me Booklet".  After visit meds:  Allergies as of 09/05/2017  No Known Allergies     Medication List    TAKE these medications   acetaminophen 500 MG tablet Commonly known as:  TYLENOL Take 500 mg by mouth every 6 (six) hours as needed for mild pain or headache.   ibuprofen 600 MG tablet Commonly known as:  ADVIL,MOTRIN Take 1 tablet (600 mg total) by mouth every 6 (six) hours.   PRENATAL PLUS IRON 29-1 MG Tabs Take dailu What changed:    how much to take  how to take this  when to take this  additional instructions   valACYclovir 500 MG tablet Commonly known as:  VALTREX Take 1 tablet (500 mg total) by mouth 2 (two) times daily.       Diet: routine diet  Activity: Advance as tolerated. Pelvic rest for 6 weeks.   Outpatient follow up:6 weeks Follow up Appt: Future Appointments  Date Time Provider Department Center  10/10/2017 10:30 AM Cresenzo-Dishmon, Scarlette Calico, CNM [facility]   Follow up Visit:No follow-ups on file.  Postpartum contraception: Nexplanon  Newborn Data: Live born female  Birth Weight: 7 lb 13.3 oz (3552 g) APGAR: 9, 9  Newborn Delivery   Birth date/time:  09/03/2017 04:09:00 Delivery type:  Vaginal, Spontaneous     Baby Feeding: Bottle and Breast Disposition:home with mother Patient has a history of post-partum  hypertension. Will schedule 1 week follow up at [facility] for blood pressure check  09/05/2017 Myrene Buddy, MD  OB FELLOW DISCHARGE ATTESTATION  I have seen and examined this patient. I agree with above documentation and have made edits as needed.   Caryl Ada, DO OB Fellow 8:52 AM

## 2017-09-05 NOTE — Discharge Instructions (Signed)
Vaginal Delivery, Care After °Refer to this sheet in the next few weeks. These instructions provide you with information about caring for yourself after vaginal delivery. Your health care provider may also give you more specific instructions. Your treatment has been planned according to current medical practices, but problems sometimes occur. Call your health care provider if you have any problems or questions. °What can I expect after the procedure? °After vaginal delivery, it is common to have: °· Some bleeding from your vagina. °· Soreness in your abdomen, your vagina, and the area of skin between your vaginal opening and your anus (perineum). °· Pelvic cramps. °· Fatigue. ° °Follow these instructions at home: °Medicines °· Take over-the-counter and prescription medicines only as told by your health care provider. °· If you were prescribed an antibiotic medicine, take it as told by your health care provider. Do not stop taking the antibiotic until it is finished. °Driving ° °· Do not drive or operate heavy machinery while taking prescription pain medicine. °· Do not drive for 24 hours if you received a sedative. °Lifestyle °· Do not drink alcohol. This is especially important if you are breastfeeding or taking medicine to relieve pain. °· Do not use tobacco products, including cigarettes, chewing tobacco, or e-cigarettes. If you need help quitting, ask your health care provider. °Eating and drinking °· Drink at least 8 eight-ounce glasses of water every day unless you are told not to by your health care provider. If you choose to breastfeed your baby, you may need to drink more water than this. °· Eat high-fiber foods every day. These foods may help prevent or relieve constipation. High-fiber foods include: °? Whole grain cereals and breads. °? Brown rice. °? Beans. °? Fresh fruits and vegetables. °Activity °· Return to your normal activities as told by your health care provider. Ask your health care provider  what activities are safe for you. °· Rest as much as possible. Try to rest or take a nap when your baby is sleeping. °· Do not lift anything that is heavier than your baby or 10 lb (4.5 kg) until your health care provider says that it is safe. °· Talk with your health care provider about when you can engage in sexual activity. This may depend on your: °? Risk of infection. °? Rate of healing. °? Comfort and desire to engage in sexual activity. °Vaginal Care °· If you have an episiotomy or a vaginal tear, check the area every day for signs of infection. Check for: °? More redness, swelling, or pain. °? More fluid or blood. °? Warmth. °? Pus or a bad smell. °· Do not use tampons or douches until your health care provider says this is safe. °· Watch for any blood clots that may pass from your vagina. These may look like clumps of dark red, brown, or black discharge. °General instructions °· Keep your perineum clean and dry as told by your health care provider. °· Wear loose, comfortable clothing. °· Wipe from front to back when you use the toilet. °· Ask your health care provider if you can shower or take a bath. If you had an episiotomy or a perineal tear during labor and delivery, your health care provider may tell you not to take baths for a certain length of time. °· Wear a bra that supports your breasts and fits you well. °· If possible, have someone help you with household activities and help care for your baby for at least a few days after   you leave the hospital. °· Keep all follow-up visits for you and your baby as told by your health care provider. This is important. °Contact a health care provider if: °· You have: °? Vaginal discharge that has a bad smell. °? Difficulty urinating. °? Pain when urinating. °? A sudden increase or decrease in the frequency of your bowel movements. °? More redness, swelling, or pain around your episiotomy or vaginal tear. °? More fluid or blood coming from your episiotomy or  vaginal tear. °? Pus or a bad smell coming from your episiotomy or vaginal tear. °? A fever. °? A rash. °? Little or no interest in activities you used to enjoy. °? Questions about caring for yourself or your baby. °· Your episiotomy or vaginal tear feels warm to the touch. °· Your episiotomy or vaginal tear is separating or does not appear to be healing. °· Your breasts are painful, hard, or turn red. °· You feel unusually sad or worried. °· You feel nauseous or you vomit. °· You pass large blood clots from your vagina. If you pass a blood clot from your vagina, save it to show to your health care provider. Do not flush blood clots down the toilet without having your health care provider look at them. °· You urinate more than usual. °· You are dizzy or light-headed. °· You have not breastfed at all and you have not had a menstrual period for 12 weeks after delivery. °· You have stopped breastfeeding and you have not had a menstrual period for 12 weeks after you stopped breastfeeding. °Get help right away if: °· You have: °? Pain that does not go away or does not get better with medicine. °? Chest pain. °? Difficulty breathing. °? Blurred vision or spots in your vision. °? Thoughts about hurting yourself or your baby. °· You develop pain in your abdomen or in one of your legs. °· You develop a severe headache. °· You faint. °· You bleed from your vagina so much that you fill two sanitary pads in one hour. °This information is not intended to replace advice given to you by your health care provider. Make sure you discuss any questions you have with your health care provider. °Document Released: 05/06/2000 Document Revised: 10/21/2015 Document Reviewed: 05/24/2015 °Elsevier Interactive Patient Education © 2018 [employer] ° °

## 2017-09-07 ENCOUNTER — Encounter: Payer: BLUE CROSS/BLUE SHIELD | Admitting: Women's Health

## 2017-09-07 ENCOUNTER — Encounter: Payer: Self-pay | Admitting: *Deleted

## 2017-09-07 ENCOUNTER — Other Ambulatory Visit: Payer: BLUE CROSS/BLUE SHIELD

## 2017-10-10 ENCOUNTER — Encounter: Payer: Self-pay | Admitting: Advanced Practice Midwife

## 2017-10-10 ENCOUNTER — Ambulatory Visit (INDEPENDENT_AMBULATORY_CARE_PROVIDER_SITE_OTHER): Payer: BLUE CROSS/BLUE SHIELD | Admitting: Advanced Practice Midwife

## 2017-10-10 NOTE — Progress Notes (Signed)
April Tyler is a 27 y.o. who presents for a postpartum visit. She is 5 weeks postpartum following a spontaneous vaginal delivery. I have fully reviewed the prenatal and intrapartum course. The delivery was at 396 gestational weeks.  Anesthesia: epidural. Postpartum course has been uneventful. Baby's course has been uneventful. Baby is feeding by bottle. Bleeding: no bleeding. Bowel function is normal. Bladder function is normal. Patient is not sexually active. Contraception method is abstinence. Postpartum depression screening: negative.   Current Outpatient Medications:  .  acetaminophen (TYLENOL) 500 MG tablet, Take 500 mg by mouth every 6 (six) hours as needed for mild pain or headache., Disp: , Rfl:  .  valACYclovir (VALTREX) 500 MG tablet, Take 1 tablet (500 mg total) by mouth 2 (two) times daily. (Patient taking differently: Take 500 mg by mouth as needed. ), Disp: 60 tablet, Rfl: 3  Review of Systems   Constitutional: Negative for fever and chills Eyes: Negative for visual disturbances Respiratory: Negative for shortness of breath, dyspnea Cardiovascular: Negative for chest pain or palpitations  Gastrointestinal: Negative for vomiting, diarrhea and constipation Genitourinary: Negative for dysuria and urgency. Still cramping some Musculoskeletal: Negative for back pain, joint pain, myalgias  Neurological: Negative for dizziness and headaches    Objective:     Vitals:   10/10/17 1054  BP: 122/70  Pulse: 65   General:  alert, cooperative and no distress   Breasts:  negative  Lungs: Normal respiratory effort  Heart:  regular rate and rhythm  Abdomen: Soft, nontender   Vulva:  normal  Vagina: normal vagina  Cervix:  closed  Corpus: Well involuted     Rectal Exam: no hemorrhoids        Assessment:    normal postpartum exam.  Plan:   1. Contraception: Nexplanon 2. Follow up in: 3 weeks for Nexplanon May have sex w/condoms until period, abstinence after period  If no  period, call day before appointment for advice.

## 2017-11-01 ENCOUNTER — Encounter: Payer: BLUE CROSS/BLUE SHIELD | Admitting: Advanced Practice Midwife

## 2017-11-01 ENCOUNTER — Encounter: Payer: Self-pay | Admitting: Advanced Practice Midwife

## 2017-11-29 DIAGNOSIS — Z0000 Encounter for general adult medical examination without abnormal findings: Secondary | ICD-10-CM | POA: Diagnosis not present

## 2017-11-29 DIAGNOSIS — Z1389 Encounter for screening for other disorder: Secondary | ICD-10-CM | POA: Diagnosis not present

## 2017-11-29 DIAGNOSIS — R42 Dizziness and giddiness: Secondary | ICD-10-CM | POA: Diagnosis not present

## 2017-11-29 DIAGNOSIS — J4520 Mild intermittent asthma, uncomplicated: Secondary | ICD-10-CM | POA: Diagnosis not present

## 2017-11-29 DIAGNOSIS — Z6839 Body mass index (BMI) 39.0-39.9, adult: Secondary | ICD-10-CM | POA: Diagnosis not present

## 2018-07-18 ENCOUNTER — Other Ambulatory Visit: Payer: BLUE CROSS/BLUE SHIELD | Admitting: Adult Health

## 2018-08-15 ENCOUNTER — Encounter (HOSPITAL_COMMUNITY): Payer: Self-pay | Admitting: Emergency Medicine

## 2018-08-15 ENCOUNTER — Emergency Department (HOSPITAL_COMMUNITY): Payer: Self-pay

## 2018-08-15 ENCOUNTER — Emergency Department (HOSPITAL_COMMUNITY)
Admission: EM | Admit: 2018-08-15 | Discharge: 2018-08-15 | Disposition: A | Discharge status: Home / Self Care | Payer: Self-pay | Attending: Emergency Medicine | Admitting: Emergency Medicine

## 2018-08-15 ENCOUNTER — Telehealth: Payer: Self-pay | Admitting: *Deleted

## 2018-08-15 ENCOUNTER — Other Ambulatory Visit: Payer: Self-pay

## 2018-08-15 DIAGNOSIS — E876 Hypokalemia: Secondary | ICD-10-CM | POA: Insufficient documentation

## 2018-08-15 DIAGNOSIS — R0789 Other chest pain: Secondary | ICD-10-CM | POA: Insufficient documentation

## 2018-08-15 DIAGNOSIS — K219 Gastro-esophageal reflux disease without esophagitis: Secondary | ICD-10-CM | POA: Insufficient documentation

## 2018-08-15 LAB — BASIC METABOLIC PANEL
Anion gap: 7 (ref 5–15)
BUN: 15 mg/dL (ref 6–20)
CO2: 25 mmol/L (ref 22–32)
Calcium: 8.7 mg/dL — ABNORMAL LOW (ref 8.9–10.3)
Chloride: 107 mmol/L (ref 98–111)
Creatinine, Ser: 0.72 mg/dL (ref 0.44–1.00)
GFR calc Af Amer: >60 mL/min (ref >60)
GFR calc non Af Amer: >60 mL/min (ref >60)
Glucose, Bld: 103 mg/dL — ABNORMAL HIGH (ref 70–99)
Potassium: 2.9 mmol/L — ABNORMAL LOW (ref 3.5–5.1)
Sodium: 139 mmol/L (ref 135–145)

## 2018-08-15 LAB — CBC
HCT: 37.7 % (ref 36.0–46.0)
Hemoglobin: 11.7 g/dL — ABNORMAL LOW (ref 12.0–15.0)
MCH: 26.8 pg (ref 26.0–34.0)
MCHC: 31 g/dL (ref 30.0–36.0)
MCV: 86.5 fL (ref 80.0–100.0)
Platelets: 313 10*3/uL (ref 150–400)
RBC: 4.36 MIL/uL (ref 3.87–5.11)
RDW: 12.7 % (ref 11.5–15.5)
WBC: 6.7 10*3/uL (ref 4.0–10.5)
nRBC: 0 % (ref 0.0–0.2)

## 2018-08-15 LAB — D-DIMER, QUANTITATIVE: D-Dimer, Quant: 1.59 ug/mL-FEU — ABNORMAL HIGH (ref 0.00–0.50)

## 2018-08-15 LAB — TROPONIN I: Troponin I: <0.03 ng/mL (ref <0.03)

## 2018-08-15 MED ORDER — OMEPRAZOLE 20 MG PO CPDR: DELAYED_RELEASE_CAPSULE | ORAL | 0 refills | Status: DC

## 2018-08-15 MED ORDER — IOHEXOL 350 MG/ML SOLN
100.0000 mL | Freq: Once | INTRAVENOUS | Status: AC | PRN
  Administered 2018-08-15: 100 mL via INTRAVENOUS

## 2018-08-15 MED ORDER — FAMOTIDINE IN NACL 20-0.9 MG/50ML-% IV SOLN
20.0000 mg | Freq: Once | INTRAVENOUS | Status: AC
  Administered 2018-08-15: 20 mg via INTRAVENOUS
  Filled 2018-08-15: qty 50

## 2018-08-15 NOTE — ED Notes (Addendum)
Pt back from x-ray.

## 2018-08-15 NOTE — ED Provider Notes (Signed)
[facility] EMERGENCY DEPARTMENT Provider Note   CSN: [id number] Arrival date & time: 08/15/18  1478  Time seen 5:10 AM (she was not in the room at 4:55 AM)  History   Chief Complaint Chief Complaint  Patient presents with  . Chest Pain    HPI April Tyler is a 28 y.o. female.     HPI patient states about 8:30 PM she started having some chest heaviness.  She states she has never had it before.  She took some Tylenol and was able to sleep until her 37-month-old daughter woke her up around 2 AM and she realized it was still there.  She states she thought she had maybe some weakness in her left upper arm and that prompted her ED visit.  She denies cough, fever, rhinorrhea, sneezing, nausea, vomiting.  She denies shortness of breath or radiation of the pain.  She states she is also had some pain underneath her left shoulder blade off and on for many years but it is not related to her chest pain.  She thinks she is stressed because she had her daughter in the ED last night to be evaluated for possible arm injury.  Patient states she has had some GERD symptoms in the past but not tonight.  She denies abdominal bloating.  PCP Patient, No Pcp Per   Past Medical History:  Diagnosis Date  . Burning with urination 01/31/2014  . BV (bacterial vaginosis) 01/31/2014  . Contraceptive management 11/29/2013  . Exposure to chlamydia 03/13/2015  . History of elective abortion 11/29/2013  . Induced abortion 08/21/15  . Screening for STD (sexually transmitted disease) 09/11/2015  . UTI (lower urinary tract infection)   . Vaginal discharge 11/29/2013    Patient Active Problem List   Diagnosis Date Noted  . SVD (spontaneous vaginal delivery) 09/03/2017  . GBS bacteriuria 05/11/2017  . Supervision of normal pregnancy 04/10/2017  . Late prenatal care affecting pregnancy, antepartum 04/10/2017  . 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 History of postpartum hypertension 04/10/2017  . Miscarriage 12/22/2015   . BV (bacterial vaginosis) 01/31/2014  . Lower urinary tract infectious disease 01/31/2014  . History of elective abortion 11/29/2013    Past Surgical History:  Procedure Laterality Date  . elective abortion    . elective abortion  08/21/15     OB History    Gravida  7   Para  4   Term  4   Preterm      AB  3   Living  4     SAB  1   TAB  2   Ectopic      Multiple  0   Live Births  4            Home Medications    Cipro for UTI  Prior to Admission medications   Medication Sig Start Date End Date Taking? Authorizing Provider  acetaminophen (TYLENOL) 500 MG tablet Take 500 mg by mouth every 6 (six) hours as needed for mild pain or headache.    [provider]  omeprazole (PRILOSEC) 20 MG capsule Take 1 po BID x 2 weeks then once a day 08/15/18   Devoria Albe, MD  valACYclovir (VALTREX) 500 MG tablet Take 1 tablet (500 mg total) by mouth 2 (two) times daily. Patient taking differently: Take 500 mg by mouth as needed.  07/19/17   Jacklyn Shell, CNM    Family History Family History  Problem Relation Age of Onset  . Other Father  B 12 Deficiency  . Asthma Daughter   . Hypertension Maternal Grandmother   . Obesity Paternal Grandmother   . Other Sister        B 12 Defficiency  . Hypertension Maternal Aunt     Social History Social History   Tobacco Use  . Smoking status: Never Smoker  . Smokeless tobacco: Never Used  Substance Use Topics  . Alcohol use: No    Comment: occ; not now  . 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   Review of Systems Review of Systems  All other systems reviewed and are negative.    Physical Exam Updated Vital Signs BP (!) 123/97   Pulse 83   Temp 97.9 F (36.6 C) (Oral)   Resp 18   Ht  (1.753 m)   Wt 108.9 kg   LMP 08/14/2018 (Exact Date)   SpO2 100%   BMI 35.44 kg/m   Vital signs normal    Physical Exam Vitals  signs and nursing note reviewed.  Constitutional:      General: She is not in acute distress.    Appearance: Normal appearance. She is well-developed. She is not ill-appearing or toxic-appearing.  HENT:     Head: Normocephalic and atraumatic.     Right Ear: External ear normal.     Left Ear: External ear normal.     Nose: Nose normal. No mucosal edema or rhinorrhea.     Mouth/Throat:     Mouth: Mucous membranes are moist.     Dentition: No dental abscesses.     Pharynx: No oropharyngeal exudate, posterior oropharyngeal erythema or uvula swelling.  Eyes:     Extraocular Movements: Extraocular movements intact.     Conjunctiva/sclera: Conjunctivae normal.     Pupils: Pupils are equal, round, and reactive to light.  Neck:     Musculoskeletal: Full passive range of motion without pain, normal range of motion and neck supple.  Cardiovascular:     Rate and Rhythm: Normal rate and regular rhythm.     Heart sounds: Normal heart sounds. No murmur. No friction rub. No gallop.   Pulmonary:     Effort: Pulmonary effort is normal. No respiratory distress.     Breath sounds: Normal breath sounds. No wheezing, rhonchi or rales.  Chest:     Chest wall: No tenderness or crepitus.       Comments: Area of chest pain noted but she is not tender to palpation Abdominal:     General: Bowel sounds are normal. There is no distension.     Palpations: Abdomen is soft.     Tenderness: There is no abdominal tenderness. There is no guarding or rebound.  Musculoskeletal: Normal range of motion.        General: No tenderness.     Comments: Moves all extremities well.   Skin:    General: Skin is warm and dry.     Coloration: Skin is not pale.     Findings: No erythema or rash.  Neurological:     Mental Status: She is alert and oriented to person, place, and time.     Cranial Nerves: No cranial nerve deficit.  Psychiatric:        Mood and Affect: Mood is not anxious.        Speech: Speech normal.         Behavior: Behavior normal.      ED  Treatments / Results  Labs (all labs ordered are listed, but only abnormal results are displayed) Results for orders placed or performed during the hospital encounter of 08/15/18  Basic metabolic panel  Result Value Ref Range   Sodium 139 135 - 145 mmol/L   Potassium 2.9 (L) 3.5 - 5.1 mmol/L   Chloride 107 98 - 111 mmol/L   CO2 25 22 - 32 mmol/L   Glucose, Bld 103 (H) 70 - 99 mg/dL   BUN 15 6 - 20 mg/dL   Creatinine, Ser 9.03 0.44 - 1.00 mg/dL   Calcium 8.7 (L) 8.9 - 10.3 mg/dL   GFR calc non Af Amer >60 >60 mL/min   GFR calc Af Amer >60 >60 mL/min   Anion gap 7 5 - 15  CBC  Result Value Ref Range   WBC 6.7 4.0 - 10.5 K/uL   RBC 4.36 3.87 - 5.11 MIL/uL   Hemoglobin 11.7 (L) 12.0 - 15.0 g/dL   HCT 83.3 38.3 - 29.1 %   MCV 86.5 80.0 - 100.0 fL   MCH 26.8 26.0 - 34.0 pg   MCHC 31.0 30.0 - 36.0 g/dL   RDW 91.6 60.6 - 00.4 %   Platelets 313 150 - 400 K/uL   nRBC 0.0 0.0 - 0.2 %  D-dimer, quantitative (not at [facility])  Result Value Ref Range   D-Dimer, Quant 1.59 (H) 0.00 - 0.50 ug/mL-FEU  Troponin I -  Result Value Ref Range   Troponin I <0.03 <0.03 ng/mL   Laboratory interpretation all normal except significantly elevated d-dimer, hypokalemia    EKG EKG Interpretation  Date/Time:  Wednesday August 15 2018 04:34:02 EDT Ventricular Rate:  79 PR Interval:    QRS Duration: 111 QT Interval:  404 QTC Calculation: 464 R Axis:   48 Text Interpretation:  Sinus rhythm Non-specific intra-ventricular conduction delay No old tracing to compare Confirmed by Devoria Albe ([id number]) on 08/15/2018 4:37:41 AM   Radiology Dg Chest 2 View  Result Date: 08/15/2018 CLINICAL DATA:  Chest tightness EXAM: CHEST - 2 VIEW COMPARISON:  06/13/2007 FINDINGS: Normal heart size and mediastinal contours. No acute infiltrate or edema. No effusion or pneumothorax. Scoliosis was seen on the prior study but not today. I confirmed with the technologist that the correct  patient was imaged. No acute osseous findings. IMPRESSION: Negative chest. Electronically Signed   By: Marnee Spring M.D.   On: 08/15/2018 05:18   Ct Angio Chest Pe W/cm &/or Wo Cm  Result Date: 08/15/2018 CLINICAL DATA:  Chest tightness EXAM: CT ANGIOGRAPHY CHEST WITH CONTRAST TECHNIQUE: Multidetector CT imaging of the chest was performed using the standard protocol during bolus administration of intravenous contrast. Multiplanar CT image reconstructions and MIPs were obtained to evaluate the vascular anatomy. CONTRAST:  OMNIPAQUE IOHEXOL 350 MG/ML SOLN COMPARISON:  None. FINDINGS: Cardiovascular: Satisfactory opacification of the pulmonary arteries to the segmental level. No evidence of pulmonary embolism. Normal heart size. No pericardial effusion. Mediastinum/Nodes: Small residual thymus.  Negative for adenopathy Lungs/Pleura: There is no edema, consolidation, effusion, or pneumothorax. Small incidental tracheal diverticulum rightward at the thoracic inlet. Upper Abdomen: Negative Musculoskeletal: Negative Review of the MIP images confirms the above findings. IMPRESSION: Negative chest CTA. Electronically Signed   By: Marnee Spring M.D.   On: 08/15/2018 06:13    Procedures Procedures (including critical care time)  Medications Ordered in ED Medications  famotidine (PEPCID) IVPB 20 mg premix (0 mg Intravenous Stopped 08/15/18 0600)  iohexol (OMNIPAQUE) 350 MG/ML injection 100 mL (100  mLs Intravenous Contrast Given 08/15/18 0542)     Initial Impression / Assessment and Plan / ED Course  I have reviewed the triage vital signs and the nursing notes.  Pertinent labs & imaging results that were available during my care of the patient were reviewed by me and considered in my medical decision making (see chart for details).       Patient was given IV Pepcid since she has had some problems with reflux in the past.  Her d-dimer was significantly elevated and a CTA of her chest to rule out  PE was done.  Recheck at 6:20 AM patient states her discomfort is better after the IV Pepcid.  We discussed her CTA results and she feels good enough to be discharged home.  I am going to give her some instructions of food guidelines for GERD.  She also will be going home with Prilosec.  I am also go put her on a low-dose ibuprofen though for possible chest wall pain component of her discomfort.  Final Clinical Impressions(s) / ED Diagnoses   Final diagnoses:  Hypokalemia  Atypical chest pain  Gastroesophageal reflux disease without esophagitis    ED Discharge Orders         Ordered    omeprazole (PRILOSEC) 20 MG capsule     08/15/18 0626         Plan discharge  Devoria Albe, MD, Concha Pyo, MD 08/15/18 [phone]

## 2018-08-15 NOTE — ED Triage Notes (Addendum)
Pt c/o chest tightness that started at 2100 last night. Pt states she "thinks she is just anxious." States she was having pain in between her shoulder blades, down her arm, and was sweaty. Pt states she feels as if something is sitting on her chest

## 2018-08-15 NOTE — Telephone Encounter (Signed)
Left message letting pt know no visitors or children at tomorrow's appt. Also, if she has come in contact with someone that has been confirmed or suspected of having Covid-19 or if she is experiencing symptoms, call office before coming to appt. JSY

## 2018-08-15 NOTE — ED Notes (Signed)
Patient transported to X-ray 

## 2018-08-15 NOTE — Discharge Instructions (Addendum)
Use ice and heat for comfort.  Take the Prilosec as prescribed.  Look at the information about foods to avoid when you have acid reflux problems.  You can take a low-dose ibuprofen with food, 400 mg, 3 times a day for discomfort also.  If you should get acid fluid in your throat you can use a Tums or take Maalox.

## 2018-08-16 ENCOUNTER — Other Ambulatory Visit: Payer: Self-pay | Admitting: Adult Health

## 2018-09-16 ENCOUNTER — Other Ambulatory Visit: Payer: Self-pay | Admitting: Advanced Practice Midwife

## 2018-10-01 ENCOUNTER — Other Ambulatory Visit: Payer: Self-pay | Admitting: *Deleted

## 2018-10-11 MED ORDER — VALACYCLOVIR HCL 500 MG PO TABS: 500.0000 mg | ORAL_TABLET | Freq: Two times a day (BID) | ORAL | 3 refills | Status: DC

## 2019-06-12 ENCOUNTER — Other Ambulatory Visit: Payer: Self-pay

## 2019-06-12 ENCOUNTER — Encounter: Payer: Self-pay | Admitting: Adult Health

## 2019-06-12 ENCOUNTER — Ambulatory Visit: Payer: 59 | Admitting: Adult Health

## 2019-06-12 ENCOUNTER — Other Ambulatory Visit (HOSPITAL_COMMUNITY)
Admission: RE | Admit: 2019-06-12 | Discharge: 2019-06-12 | Disposition: A | Discharge status: Home / Self Care | Payer: 59 | Source: Ambulatory Visit | Attending: Adult Health | Admitting: Adult Health

## 2019-06-12 VITALS — BP 131/86 | HR 84 | Ht 68.0 in | Wt 257.0 lb

## 2019-06-12 DIAGNOSIS — N760 Acute vaginitis: Secondary | ICD-10-CM

## 2019-06-12 DIAGNOSIS — Z3202 Encounter for pregnancy test, result negative: Secondary | ICD-10-CM

## 2019-06-12 DIAGNOSIS — B9689 Other specified bacterial agents as the cause of diseases classified elsewhere: Secondary | ICD-10-CM | POA: Insufficient documentation

## 2019-06-12 DIAGNOSIS — Z113 Encounter for screening for infections with a predominantly sexual mode of transmission: Secondary | ICD-10-CM | POA: Insufficient documentation

## 2019-06-12 DIAGNOSIS — N898 Other specified noninflammatory disorders of vagina: Secondary | ICD-10-CM | POA: Insufficient documentation

## 2019-06-12 DIAGNOSIS — N926 Irregular menstruation, unspecified: Secondary | ICD-10-CM | POA: Insufficient documentation

## 2019-06-12 DIAGNOSIS — R350 Frequency of micturition: Secondary | ICD-10-CM | POA: Diagnosis not present

## 2019-06-12 LAB — POCT WET PREP (WET MOUNT)
Clue Cells Wet Prep Whiff POC: POSITIVE
WBC, Wet Prep HPF POC: POSITIVE

## 2019-06-12 LAB — POCT URINALYSIS DIPSTICK OB
Glucose, UA: NEGATIVE
Leukocytes, UA: NEGATIVE
Nitrite, UA: NEGATIVE

## 2019-06-12 LAB — POCT URINE PREGNANCY: Preg Test, Ur: NEGATIVE

## 2019-06-12 MED ORDER — METRONIDAZOLE 500 MG PO TABS: 500.0000 mg | ORAL_TABLET | Freq: Two times a day (BID) | ORAL | 0 refills | Status: DC

## 2019-06-12 NOTE — Progress Notes (Addendum)
  Subjective:     Patient ID: April Tyler, female   DOB: Sep 27, 1990, 29 y.o.   MRN: [id number]  HPI Dru is a 29 year old black female, single, [id number], in complaining of urinary frequency and some burring and has discharge and wants STD testing had unprotected sex first of January after 2 year abstinence. And period is late.   Review of Systems +Urinary  frequency with some burning +vaginal discharge +Period late  Reviewed past medical,surgical, social and family history. Reviewed medications and allergies.     Objective:   Physical Exam BP 131/86 (BP Location: Left Arm, Patient Position: Sitting, Cuff Size: Normal)   Pulse 84   Ht 5\' 8"  (1.727 m)   Wt 257 lb (116.6 kg)   LMP 05/09/2019 (Exact Date)   BMI 39.08 kg/m UPT negative. Urine dipstick samll ketones, blood and protein. Skin warm and dry.Pelvic: external genitalia is normal in appearance no lesions, vagina: white discharge with odor,urethra has no lesions or masses noted, cervix:smooth and bulbous, uterus: normal size, shape and contour, non tender, no masses felt, adnexa: no masses or tenderness noted. Bladder is non tender and no masses felt. Wet prep: + for clue cells and +WBCs. CV swab obtained. Examination chaperoned by 05/11/2019 Rash LPN.     Assessment:     1. Urinary frequency UA C&S sent  2. Late period Check QHCG, if negative will rx OCs  3. Vaginal discharge   4. BV (bacterial vaginosis) Will rx flagyl Meds ordered this encounter  Medications  . metroNIDAZOLE (FLAGYL) 500 MG tablet    Sig: Take 1 tablet (500 mg total) by mouth 2 (two) times daily.    Dispense:  14 tablet    Refill:  0    Order Specific Question:   Supervising Provider    Answer:   Marchelle Folks, LUTHER H [[id number]]    5. Screening examination for STD (sexually transmitted disease) CV swab sent for GC/CHL,trich and BV She declines HIV and RPR    Plan:     Return in November for pap and physical

## 2019-06-12 NOTE — Addendum Note (Signed)
Addended by: Cyril Mourning A on: 06/12/2019 10:11 AM   Modules accepted: Orders

## 2019-06-13 ENCOUNTER — Other Ambulatory Visit: Payer: Self-pay | Admitting: Adult Health

## 2019-06-13 LAB — URINALYSIS, ROUTINE W REFLEX MICROSCOPIC
Bilirubin, UA: NEGATIVE
Glucose, UA: NEGATIVE
Ketones, UA: NEGATIVE
Leukocytes,UA: NEGATIVE
Nitrite, UA: NEGATIVE
Protein,UA: NEGATIVE
RBC, UA: NEGATIVE
Specific Gravity, UA: >=1.03 — AB (ref 1.005–1.030)
Urobilinogen, Ur: 0.2 mg/dL (ref 0.2–1.0)
pH, UA: 5.5 (ref 5.0–7.5)

## 2019-06-13 LAB — CERVICOVAGINAL ANCILLARY ONLY
Bacterial Vaginitis (gardnerella): POSITIVE — AB
Chlamydia: NEGATIVE
Comment: NEGATIVE
Comment: NEGATIVE
Comment: NEGATIVE
Comment: NORMAL
Neisseria Gonorrhea: NEGATIVE
Trichomonas: NEGATIVE

## 2019-06-13 LAB — BETA HCG QUANT (REF LAB): hCG Quant: <1 m[IU]/mL

## 2019-06-13 MED ORDER — NORETHIN ACE-ETH ESTRAD-FE 1-20 MG-MCG PO TABS: 1.0000 | ORAL_TABLET | Freq: Every day | ORAL | 11 refills | Status: DC

## 2019-06-13 NOTE — Progress Notes (Signed)
rx junel 1/20 

## 2019-06-14 LAB — URINE CULTURE

## 2019-07-09 ENCOUNTER — Other Ambulatory Visit: Payer: Self-pay | Admitting: Adult Health

## 2019-07-09 MED ORDER — FLUCONAZOLE 150 MG PO TABS: ORAL_TABLET | ORAL | 1 refills | Status: DC

## 2019-07-09 NOTE — Progress Notes (Signed)
Rx diflucan.  

## 2019-08-30 ENCOUNTER — Other Ambulatory Visit: Payer: Self-pay | Admitting: Adult Health

## 2019-08-30 MED ORDER — FLUCONAZOLE 150 MG PO TABS: ORAL_TABLET | ORAL | 1 refills | Status: DC

## 2019-08-30 MED ORDER — SULFAMETHOXAZOLE-TRIMETHOPRIM 800-160 MG PO TABS: 1.0000 | ORAL_TABLET | Freq: Two times a day (BID) | ORAL | 0 refills | Status: DC

## 2019-08-30 NOTE — Progress Notes (Signed)
rx septra ds and diflucan

## 2020-01-13 ENCOUNTER — Other Ambulatory Visit: Payer: Self-pay | Admitting: Adult Health

## 2020-01-13 MED ORDER — METRONIDAZOLE 500 MG PO TABS: 500.0000 mg | ORAL_TABLET | Freq: Two times a day (BID) | ORAL | 0 refills | Status: DC

## 2020-01-13 NOTE — Progress Notes (Signed)
Will refill flagyl  

## 2020-05-22 ENCOUNTER — Encounter: Payer: Self-pay | Admitting: Family Medicine

## 2020-05-22 ENCOUNTER — Other Ambulatory Visit: Payer: Self-pay

## 2020-05-22 ENCOUNTER — Ambulatory Visit
Admission: EM | Admit: 2020-05-22 | Discharge: 2020-05-22 | Disposition: A | Discharge status: Home / Self Care | Payer: 59

## 2020-05-22 DIAGNOSIS — R079 Chest pain, unspecified: Secondary | ICD-10-CM

## 2020-05-22 NOTE — Discharge Instructions (Addendum)
Like we spoke about this can be musculoskeletal versus stress and anxiety. Your EKG was normal and your lungs are clear.  Vital signs are normal. No concerns for any heart related issue today. Recommend doing some ibuprofen or Aleve to see if this helps. He can do Flonase and Zyrtec over-the-counter for nasal congestion Follow up as needed for continued or worsening symptoms

## 2020-05-22 NOTE — ED Provider Notes (Signed)
[facility]    CSN: [id number] Arrival date & time: 05/22/20  0915      History   Chief Complaint No chief complaint on file.   HPI April Tyler is a 29 y.o. female.   Patient is a 29 year old female who presents today with left-sided chest pressure.  This developed 2 days ago.  She is also had some mild anxiety.  Previous to that she had upper respiratory infection.  Has had some lingering nasal congestion.  No cough, chest congestion, fevers, shortness of breath.      Past Medical History:  Diagnosis Date  . Burning with urination 01/31/2014  . BV (bacterial vaginosis) 01/31/2014  . Contraceptive management 11/29/2013  . Exposure to chlamydia 03/13/2015  . History of elective abortion 11/29/2013  . Induced abortion 08/21/15  . Screening for STD (sexually transmitted disease) 09/11/2015  . UTI (lower urinary tract infection)   . Vaginal discharge 11/29/2013    Patient Active Problem List   Diagnosis Date Noted  . Screening examination for STD (sexually transmitted disease) 06/12/2019  . Vaginal discharge 06/12/2019  . Late period 06/12/2019  . Urinary frequency 06/12/2019  . SVD (spontaneous vaginal delivery) 09/03/2017  . GBS bacteriuria 05/11/2017  . Supervision of normal pregnancy 04/10/2017  . Late prenatal care affecting pregnancy, antepartum 04/10/2017  . 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 History of postpartum hypertension 04/10/2017  . Miscarriage 12/22/2015  . BV (bacterial vaginosis) 01/31/2014  . Lower urinary tract infectious disease 01/31/2014  . History of elective abortion 11/29/2013    Past Surgical History:  Procedure Laterality Date  . elective abortion    . elective abortion  08/21/15    OB History    Gravida  7   Para  4   Term  4   Preterm      AB  3   Living  4     SAB  1   IAB  2   Ectopic      Multiple  0   Live Births  4            Home Medications    Prior to Admission medications   Medication  Sig Start Date End Date Taking? Authorizing Provider  acetaminophen (TYLENOL) 500 MG tablet Take 500 mg by mouth every 6 (six) hours as needed for mild pain or headache.    [provider]  fluconazole (DIFLUCAN) 150 MG tablet Take 1 now and repeat 1 in 3 days 08/30/19   Adline Potter, NP  metroNIDAZOLE (FLAGYL) 500 MG tablet Take 1 tablet (500 mg total) by mouth 2 (two) times daily. 01/13/20   Adline Potter, NP  norethindrone-ethinyl estradiol (LOESTRIN FE) 1-20 MG-MCG tablet Take 1 tablet by mouth daily. 06/13/19 06/12/20  Adline Potter, NP  omeprazole (PRILOSEC) 20 MG capsule Take 1 po BID x 2 weeks then once a day Patient not taking: Reported on 06/12/2019 08/15/18   Devoria Albe, MD  sulfamethoxazole-trimethoprim (BACTRIM DS) 800-160 MG tablet Take 1 tablet by mouth 2 (two) times daily. Take 1 bid 08/30/19   Adline Potter, NP  valACYclovir (VALTREX) 500 MG tablet Take 1 tablet (500 mg total) by mouth 2 (two) times daily. 10/11/18   Jacklyn Shell, CNM    Family History Family History  Problem Relation Age of Onset  . Other Father        B 12 Deficiency  . Asthma Daughter   . Hypertension Maternal Grandmother   . Obesity  Paternal Grandmother   . Other Sister        B 12 Defficiency  . Hypertension Maternal Aunt     Social History Social History   Tobacco Use  . Smoking status: Never Smoker  . Smokeless tobacco: Never Used  Substance Use Topics  . Alcohol use: No    Comment: occ; not now  . Drug use: No     Allergies   Patient has no known allergies.   Review of Systems Review of Systems   Physical Exam Triage Vital Signs ED Triage Vitals [05/22/20 0945]  Enc Vitals Group     BP 130/79     Pulse Rate 77     Resp 15     Temp 98.5 F (36.9 C)     Temp src      SpO2 98 %     Weight      Height      Head Circumference      Peak Flow      Pain Score      Pain Loc      Pain Edu?      Excl. in GC?    No data  found.  Updated Vital Signs BP 130/79   Pulse 77   Temp 98.5 F (36.9 C)   Resp 15   LMP 05/01/2020   SpO2 98%   Visual Acuity Right Eye Distance:   Left Eye Distance:   Bilateral Distance:    Right Eye Near:   Left Eye Near:    Bilateral Near:     Physical Exam Vitals and nursing note reviewed.  Constitutional:      General: She is not in acute distress.    Appearance: Normal appearance. She is not ill-appearing, toxic-appearing or diaphoretic.  HENT:     Head: Normocephalic.     Nose: Nose normal.     Mouth/Throat:     Pharynx: Oropharynx is clear.  Eyes:     Conjunctiva/sclera: Conjunctivae normal.  Cardiovascular:     Rate and Rhythm: Normal rate and regular rhythm.  Pulmonary:     Effort: Pulmonary effort is normal.     Breath sounds: Normal breath sounds.  Musculoskeletal:        General: Normal range of motion.     Cervical back: Normal range of motion.  Skin:    General: Skin is warm and dry.     Findings: No rash.  Neurological:     Mental Status: She is alert.  Psychiatric:        Mood and Affect: Mood normal.      UC Treatments / Results  Labs (all labs ordered are listed, but only abnormal results are displayed) Labs Reviewed - No data to display  EKG   Radiology No results found.  Procedures Procedures (including critical care time)  Medications Ordered in UC Medications - No data to display  Initial Impression / Assessment and Plan / UC Course  I have reviewed the triage vital signs and the nursing notes.  Pertinent labs & imaging results that were available during my care of the patient were reviewed by me and considered in my medical decision making (see chart for details).     Chest pain EKG with normal sinus rhythm normal rate today.  Lungs are clear on exam.  Vital signs stable.  Benign exam Musculoskeletal versus stress related.  Recommend ibuprofen or Aleve to see if this helps. Flonase and Zyrtec for nasal  congestion Follow up  as needed for continued or worsening symptoms  Final Clinical Impressions(s) / UC Diagnoses   Final diagnoses:  Chest pain, unspecified type     Discharge Instructions     Like we spoke about this can be musculoskeletal versus stress and anxiety. Your EKG was normal and your lungs are clear.  Vital signs are normal. No concerns for any heart related issue today. Recommend doing some ibuprofen or Aleve to see if this helps. He can do Flonase and Zyrtec over-the-counter for nasal congestion Follow up as needed for continued or worsening symptoms     ED Prescriptions    None     PDMP not reviewed this encounter.   Dahlia Byes A, NP 05/22/20 1026

## 2020-05-22 NOTE — ED Triage Notes (Signed)
Pt presents with c/o left side chest pain that developed 2 days ago, states she also experiences anxiety

## 2020-05-27 ENCOUNTER — Ambulatory Visit (INDEPENDENT_AMBULATORY_CARE_PROVIDER_SITE_OTHER): Payer: BC Managed Care – PPO | Admitting: Nurse Practitioner

## 2020-05-27 ENCOUNTER — Other Ambulatory Visit: Payer: Self-pay

## 2020-05-27 ENCOUNTER — Encounter (INDEPENDENT_AMBULATORY_CARE_PROVIDER_SITE_OTHER): Payer: Self-pay | Admitting: Nurse Practitioner

## 2020-05-27 VITALS — BP 120/80 | HR 82 | Temp 97.7°F | Ht 68.1 in | Wt 225.4 lb

## 2020-05-27 DIAGNOSIS — R6889 Other general symptoms and signs: Secondary | ICD-10-CM

## 2020-05-27 DIAGNOSIS — F419 Anxiety disorder, unspecified: Secondary | ICD-10-CM

## 2020-05-27 DIAGNOSIS — Z13220 Encounter for screening for lipoid disorders: Secondary | ICD-10-CM | POA: Diagnosis not present

## 2020-05-27 DIAGNOSIS — Z1159 Encounter for screening for other viral diseases: Secondary | ICD-10-CM

## 2020-05-27 DIAGNOSIS — Z131 Encounter for screening for diabetes mellitus: Secondary | ICD-10-CM | POA: Diagnosis not present

## 2020-05-27 DIAGNOSIS — E669 Obesity, unspecified: Secondary | ICD-10-CM

## 2020-05-27 DIAGNOSIS — R5383 Other fatigue: Secondary | ICD-10-CM | POA: Diagnosis not present

## 2020-05-27 DIAGNOSIS — K219 Gastro-esophageal reflux disease without esophagitis: Secondary | ICD-10-CM | POA: Diagnosis not present

## 2020-05-27 DIAGNOSIS — Z6834 Body mass index (BMI) 34.0-34.9, adult: Secondary | ICD-10-CM

## 2020-05-27 MED ORDER — OMEPRAZOLE 20 MG PO CPDR: 20.0000 mg | DELAYED_RELEASE_CAPSULE | Freq: Every day | ORAL | 0 refills | Status: DC

## 2020-05-27 MED ORDER — SERTRALINE HCL 25 MG PO TABS: 25.0000 mg | ORAL_TABLET | Freq: Every day | ORAL | 0 refills | Status: DC

## 2020-05-27 NOTE — Progress Notes (Signed)
Subjective:  Patient ID: April Tyler, female    DOB: 1991-02-21  Age: 30 y.o. MRN: [id number]  CC:  Chief Complaint  Patient presents with  . Establish Care    Having anxiety issues and wants to discuss, was seen at Urgent Care, wants labs done      HPI  This patient arrives today for the above.  She is here to establish care and tells me she has not been getting regular medical care prior to this visit.  She tells me that she feels she is getting older and would like to get serious about her health, which prompted her to come to this office to establish care.  She was seen in urgent care about a week ago for evaluation of left-sided chest pain.  No cardiac etiology noted at that visit.  She tells me now the pain has resolved.  She does feel like she gets a bit anxious at times and would like to start medication for treatment of this.  She denies any suicidal ideation.  She also mentions that she feels that she has heartburn on a regular basis and does experience some mild discomfort with swallowing.  She was told to start omeprazole in the past but never started this.  Past Medical History:  Diagnosis Date  . Burning with urination 01/31/2014  . BV (bacterial vaginosis) 01/31/2014  . Contraceptive management 11/29/2013  . Exposure to chlamydia 03/13/2015  . History of elective abortion 11/29/2013  . Induced abortion 08/21/15  . Screening for STD (sexually transmitted disease) 09/11/2015  . UTI (lower urinary tract infection)   . Vaginal discharge 11/29/2013      Family History  Problem Relation Age of Onset  . Other Father        B 12 Deficiency  . Asthma Daughter   . Hypertension Maternal Grandmother   . Obesity Paternal Grandmother   . Other Sister        B 12 Defficiency  . Hypertension Maternal Aunt     Social History   Social History Narrative  . Not on file   Social History   Tobacco Use  . Smoking status: Never Smoker  . Smokeless tobacco: Never Used   Substance Use Topics  . Alcohol use: No    Comment: occ; not now     Current Meds  Medication Sig  . acetaminophen (TYLENOL) 500 MG tablet Take 500 mg by mouth every 6 (six) hours as needed for mild pain or headache.  Marland Kitchen omeprazole (PRILOSEC) 20 MG capsule Take 1 capsule (20 mg total) by mouth daily.  . sertraline (ZOLOFT) 25 MG tablet Take 1 tablet (25 mg total) by mouth daily.  . valACYclovir (VALTREX) 500 MG tablet Take 1 tablet (500 mg total) by mouth 2 (two) times daily.    ROS:  Review of Systems  Constitutional: Positive for malaise/fatigue. Negative for fever.  Respiratory: Negative for cough and shortness of breath.   Cardiovascular: Negative for chest pain.  Gastrointestinal: Positive for heartburn and nausea. Negative for abdominal pain, blood in stool, melena and vomiting.  Psychiatric/Behavioral: Negative for depression and suicidal ideas. The patient is nervous/anxious. The patient does not have insomnia.      Objective:   Today's Vitals: BP 120/80   Pulse 82   Temp 97.7 F (36.5 C) (Temporal)   Ht 5' 8.1" (1.73 m)   Wt 225 lb 6.4 oz (102.2 kg)   LMP 05/01/2020   SpO2 98%   BMI  34.17 kg/m  Vitals with BMI 05/27/2020 05/22/2020 06/12/2019  Height 5' 8.1" - 5\' 8"   Weight 225 lbs 6 oz - 257 lbs  BMI 34.16 - 39.09  Systolic 120 130  Diastolic 80 79 86  Pulse 82 77 84     Physical Exam Vitals reviewed.  Constitutional:      General: She is not in acute distress.    Appearance: Normal appearance.  HENT:     Head: Normocephalic and atraumatic.  Neck:     Vascular: No carotid bruit.  Cardiovascular:     Rate and Rhythm: Normal rate and regular rhythm.     Pulses: Normal pulses.     Heart sounds: Normal heart sounds.  Pulmonary:     Effort: Pulmonary effort is normal.     Breath sounds: Normal breath sounds.  Abdominal:     General: Abdomen is flat. Bowel sounds are normal.     Palpations: Abdomen is soft.     Tenderness: There is no abdominal  tenderness.  Skin:    General: Skin is warm and dry.  Neurological:     General: No focal deficit present.     Mental Status: She is alert and oriented to person, place, and time.  Psychiatric:        Mood and Affect: Mood normal.        Behavior: Behavior normal.        Judgment: Judgment normal.       PHQ9 SCORE ONLY 05/27/2020 04/10/2017  PHQ-9 Total Score 7 2      Assessment and Plan   1. Anxiety   2. Gastroesophageal reflux disease, unspecified whether esophagitis present   3. Fatigue, unspecified type   4. Cold intolerance   5. Screening for lipid disorders   6. Screening for diabetes mellitus   7. Encounter for hepatitis C screening test for low risk patient   8. Class 1 obesity without serious comorbidity with body mass index (BMI) of 34.0 to 34.9 in adult, unspecified obesity type      Plan: 1.  We will initiate her on low-dose of Zoloft.  She will follow-up in 6 weeks for evaluation of her anxiety symptoms.  She was told if she experiences any suicidal ideation to call the office right away. 2.  We will start her on omeprazole 20 mg daily.  I told her that if after 4 days symptoms have not improved she can increase her dose up to 40 mg daily.  Of note she is of childbearing age and is not currently on any contraception.  She tells me she is not sexually active at this time.  I recommended that she use condoms if she becomes sexually active and we also discussed going on contraception if her sexual activity changes as omeprazole has mixed data regarding safety in pregnancy.  She tells me she understands.  We will consider titrating her off of omeprazole at next office visit and if symptoms return or persist at that point we will refer her to gastroenterology. 3.-8.  We will collect blood work for further evaluation today.   Tests ordered Orders Placed This Encounter  Procedures  . CBC with Differential/Platelets  . CMP with eGFR([facility])  . TSH  . T3, Free  . T4,  Free  . Lipid Panel  . Hemoglobin A1c  . Vitamin D, 25-hydroxy  . Hep C Antibody      Meds ordered this encounter  Medications  . sertraline (ZOLOFT) 25 MG tablet  Sig: Take 1 tablet (25 mg total) by mouth daily.    Dispense:  90 tablet    Refill:  0    Order Specific Question:   Supervising Provider    Answer:   Hurshel Party C [[id number]]  . omeprazole (PRILOSEC) 20 MG capsule    Sig: Take 1 capsule (20 mg total) by mouth daily.    Dispense:  90 capsule    Refill:  0    Order Specific Question:   Supervising Provider    Answer:   Doree Albee [[id number]]    Patient to follow-up in 6 weeks or sooner as needed.  Ailene Ards, NP

## 2020-05-27 NOTE — Patient Instructions (Addendum)
Sertraline tablets What is this medicine? SERTRALINE (SER tra leen) is used to treat depression. It may also be used to treat obsessive compulsive disorder, panic disorder, post-trauma stress, premenstrual dysphoric disorder (PMDD) or social anxiety. This medicine may be used for other purposes; ask your health care provider or pharmacist if you have questions. COMMON BRAND NAME(S): Zoloft What should I tell my health care provider before I take this medicine? They need to know if you have any of these conditions:  bleeding disorders  bipolar disorder or a family history of bipolar disorder  glaucoma  heart disease  high blood pressure  history of irregular heartbeat  history of low levels of calcium, magnesium, or potassium in the blood  if you often drink alcohol  liver disease  receiving electroconvulsive therapy  seizures  suicidal thoughts, plans, or attempt; a previous suicide attempt by you or a family member  take medicines that treat or prevent blood clots  thyroid disease  an unusual or allergic reaction to sertraline, other medicines, foods, dyes, or preservatives  pregnant or trying to get pregnant  breast-feeding How should I use this medicine? Take this medicine by mouth with a glass of water. Follow the directions on the prescription label. You can take it with or without food. Take your medicine at regular intervals. Do not take your medicine more often than directed. Do not stop taking this medicine suddenly except upon the advice of your doctor. Stopping this medicine too quickly may cause serious side effects or your condition may worsen. A special MedGuide will be given to you by the pharmacist with each prescription and refill. Be sure to read this information carefully each time. Talk to your pediatrician regarding the use of this medicine in children. While this drug may be prescribed for children as young as 7 years for selected conditions,  precautions do apply. Overdosage: If you think you have taken too much of this medicine contact a poison control center or emergency room at once. NOTE: This medicine is only for you. Do not share this medicine with others. What if I miss a dose? If you miss a dose, take it as soon as you can. If it is almost time for your next dose, take only that dose. Do not take double or extra doses. What may interact with this medicine? Do not take this medicine with any of the following medications:  cisapride  dronedarone  linezolid  MAOIs like Carbex, Eldepryl, Marplan, Nardil, and Parnate  methylene blue (injected into a vein)  pimozide  thioridazine This medicine may also interact with the following medications:  alcohol  amphetamines  aspirin and aspirin-like medicines  certain medicines for depression, anxiety, or psychotic disturbances  certain medicines for fungal infections like ketoconazole, fluconazole, posaconazole, and itraconazole  certain medicines for irregular heart beat like flecainide, quinidine, propafenone  certain medicines for migraine headaches like almotriptan, eletriptan, frovatriptan, naratriptan, rizatriptan, sumatriptan, zolmitriptan  certain medicines for sleep  certain medicines for seizures like carbamazepine, valproic acid, phenytoin  certain medicines that treat or prevent blood clots like warfarin, enoxaparin, dalteparin  cimetidine  digoxin  diuretics  fentanyl  isoniazid  lithium  NSAIDs, medicines for pain and inflammation, like ibuprofen or naproxen  other medicines that prolong the QT interval (cause an abnormal heart rhythm) like dofetilide  rasagiline  safinamide  supplements like St. John's wort, kava kava, valerian  tolbutamide  tramadol  tryptophan This list may not describe all possible interactions. Give your health care provider  a list of all the medicines, herbs, non-prescription drugs, or dietary supplements  you use. Also tell them if you smoke, drink alcohol, or use illegal drugs. Some items may interact with your medicine. What should I watch for while using this medicine? Tell your doctor if your symptoms do not get better or if they get worse. Visit your doctor or health care professional for regular checks on your progress. Because it may take several weeks to see the full effects of this medicine, it is important to continue your treatment as prescribed by your doctor. Patients and their families should watch out for new or worsening thoughts of suicide or depression. Also watch out for sudden changes in feelings such as feeling anxious, agitated, panicky, irritable, hostile, aggressive, impulsive, severely restless, overly excited and hyperactive, or not being able to sleep. If this happens, especially at the beginning of treatment or after a change in dose, call your health care professional. Bonita Quin may get drowsy or dizzy. Do not drive, use machinery, or do anything that needs mental alertness until you know how this medicine affects you. Do not stand or sit up quickly, especially if you are an older patient. This reduces the risk of dizzy or fainting spells. Alcohol may interfere with the effect of this medicine. Avoid alcoholic drinks. Your mouth may get dry. Chewing sugarless gum or sucking hard candy, and drinking plenty of water may help. Contact your doctor if the problem does not go away or is severe. What side effects may I notice from receiving this medicine? Side effects that you should report to your doctor or health care professional as soon as possible:  allergic reactions like skin rash, itching or hives, swelling of the face, lips, or tongue  anxious  black, tarry stools  changes in vision  confusion  elevated mood, decreased need for sleep, racing thoughts, impulsive behavior  eye pain  fast, irregular heartbeat  feeling faint or lightheaded, falls  feeling agitated,  angry, or irritable  hallucination, loss of contact with reality  loss of balance or coordination  loss of memory  painful or prolonged erections  restlessness, pacing, inability to keep still  seizures  stiff muscles  suicidal thoughts or other mood changes  trouble sleeping  unusual bleeding or bruising  unusually weak or tired  vomiting Side effects that usually do not require medical attention (report to your doctor or health care professional if they continue or are bothersome):  change in appetite or weight  change in sex drive or performance  diarrhea  increased sweating  indigestion, nausea  tremors This list may not describe all possible side effects. Call your doctor for medical advice about side effects. You may report side effects to FDA at 1-800-FDA-1088. Where should I keep my medicine? Keep out of the reach of children. Store at room temperature between 15 and 30 degrees C (59 and 86 degrees F). Throw away any unused medicine after the expiration date. NOTE: This sheet is a summary. It may not cover all possible information. If you have questions about this medicine, talk to your doctor, pharmacist, or health care provider.    Omeprazole; Sodium Bicarbonate oral capsule What is this medicine? OMEPRAZOLE; SODIUM BICARBONATE (oh ME pray zol; SOE dee um bye KAR bon ate) prevents the production of acid in the stomach. It is used to treat gastroesophageal reflux disease (GERD), ulcers, and inflammation of the esophagus. This medicine may be used for other purposes; ask your health care provider or  pharmacist if you have questions. COMMON BRAND NAME(S): OmePPi, Zegerid What should I tell my health care provider before I take this medicine? They need to know if you have any of these conditions:  Bartter's syndrome  diet low in salt  heart failure  history of low levels of calcium, magnesium, or potassium in the blood  kidney  disease  lupus  problems with acid-base balance in your body  an unusual reaction to omeprazole, sodium bicarbonate, other medicines, foods, dyes, or preservatives  pregnant or trying to get pregnant  breast-feeding How should I use this medicine? Take this medicine by mouth with a glass of water. Do not take with any other liquids. Follow the directions on the prescription label. Do not cut, crush or chew this medicine. Swallow the capsules whole. Take this medicine on an empty stomach, at least 1 hour before a meal. Take your medicine at regular intervals. Do not take your medicine more often than directed. Do not stop taking except on your doctor's advice. A special MedGuide will be given to you by the pharmacist with each prescription and refill. Be sure to read this information carefully each time. Talk to your pediatrician regarding the use of this medicine in children. Special care may be needed. Overdosage: If you think you have taken too much of this medicine contact a poison control center or emergency room at once. NOTE: This medicine is only for you. Do not share this medicine with others. What if I miss a dose? If you miss a dose, take it as soon as you can. If it is almost time for your next dose, take only that dose. Do not take double or extra doses. What may interact with this medicine? Do not take this medicine with any of the following medications:  atazanavir  clopidogrel  nelfinavir  rilpivirine This medicine may also interact with the following medications:  antifungals like itraconazole, ketoconazole, and voriconazole  certain antivirals for HIV or hepatitis  certain medicines that treat or prevent blood clots like warfarin  cilostazol  citalopram  cyclosporine  dasatinib  digoxin  disfulfiram  diuretics  erlotinib  iron supplements  medicines for anxiety, panic, and sleep like diazepam  medicines for seizures like carbamazepine,  phenobarbital, phenytoin  methotrexate  mycophenolate mofetil  nilotinib  rifampin  St. John's wort  tacrolimus  vitamin B12 This list may not describe all possible interactions. Give your health care provider a list of all the medicines, herbs, non-prescription drugs, or dietary supplements you use. Also tell them if you smoke, drink alcohol, or use illegal drugs. Some items may interact with your medicine. What should I watch for while using this medicine? Visit your healthcare professional for regular checks on your progress. Tell your healthcare professional if your symptoms do not start to get better or if they get worse. You may need blood work done while taking this medicine. This medicine may cause a decrease in vitamin B12. You should make sure that you get enough vitamin B12 while you are taking this medicine. Discuss the foods you eat and the vitamins you take with your health care professional. What side effects may I notice from receiving this medicine? Side effects that you should report to your doctor or health care professional as soon as possible:  allergic reactions like skin rash, itching or hives, swelling of the face, lips, or tongue  bone pain  breathing problems  fever or sore throat  joint pain  rash on cheeks or  arms that gets worse in the sun  redness, blistering, peeling, or loosening of the skin, including inside the mouth  severe diarrhea  signs and symptoms of kidney injury like trouble passing urine or change in the amount of urine  signs and symptoms of low magnesium like muscle cramps; muscle pain; muscle weakness; tremors; seizures; or fast, irregular heartbeat  swelling of the ankles, legs  stomach polyps  unusual bleeding or bruising Side effects that usually do not require medical attention (report to your doctor or health care professional if they continue or are bothersome):  diarrhea  dry  mouth  gas  headache  nausea  stomach pain This list may not describe all possible side effects. Call your doctor for medical advice about side effects. You may report side effects to FDA at 1-800-FDA-1088. Where should I keep my medicine? Keep out of the reach of children. Store at room temperature between 15 and 30 degrees C (59 and 86 degrees F). Protect from moisture. Throw away any unused medicine after the expiration date. NOTE: This sheet is a summary. It may not cover all possible information. If you have questions about this medicine, talk to your doctor, pharmacist, or health care provider.  2020 Elsevier/Gold Standard (2018-02-28 13:41:35)    2020 [employer]vier/[employer] Standard (2018-05-01 10:09:27)

## 2020-05-28 ENCOUNTER — Encounter (INDEPENDENT_AMBULATORY_CARE_PROVIDER_SITE_OTHER): Payer: Self-pay | Admitting: Nurse Practitioner

## 2020-05-28 DIAGNOSIS — E559 Vitamin D deficiency, unspecified: Secondary | ICD-10-CM | POA: Insufficient documentation

## 2020-05-28 LAB — COMPLETE METABOLIC PANEL WITH GFR
AG Ratio: 1.5 (calc) (ref 1.0–2.5)
ALT: 23 U/L (ref 6–29)
AST: 21 U/L (ref 10–30)
Albumin: 4.2 g/dL (ref 3.6–5.1)
Alkaline phosphatase (APISO): 45 U/L (ref 31–125)
BUN: 14 mg/dL (ref 7–25)
CO2: 27 mmol/L (ref 20–32)
Calcium: 9.3 mg/dL (ref 8.6–10.2)
Chloride: 106 mmol/L (ref 98–110)
Creat: 0.89 mg/dL (ref 0.50–1.10)
GFR, Est African American: 100 mL/min/{1.73_m2} (ref >=60)
GFR, Est Non African American: 86 mL/min/{1.73_m2} (ref >=60)
Globulin: 2.8 g/dL (calc) (ref 1.9–3.7)
Glucose, Bld: 92 mg/dL (ref 65–139)
Potassium: 4 mmol/L (ref 3.5–5.3)
Sodium: 138 mmol/L (ref 135–146)
Total Bilirubin: 0.3 mg/dL (ref 0.2–1.2)
Total Protein: 7 g/dL (ref 6.1–8.1)

## 2020-05-28 LAB — CBC WITH DIFFERENTIAL/PLATELET
Absolute Monocytes: 383 cells/uL (ref 200–950)
Basophils Absolute: 22 cells/uL (ref 0–200)
Basophils Relative: 0.4 %
Eosinophils Absolute: 32 cells/uL (ref 15–500)
Eosinophils Relative: 0.6 %
HCT: 36.2 % (ref 35.0–45.0)
Hemoglobin: 11.9 g/dL (ref 11.7–15.5)
Lymphs Abs: 1199 cells/uL (ref 850–3900)
MCH: 26.9 pg — ABNORMAL LOW (ref 27.0–33.0)
MCHC: 32.9 g/dL (ref 32.0–36.0)
MCV: 81.9 fL (ref 80.0–100.0)
MPV: 10.1 fL (ref 7.5–12.5)
Monocytes Relative: 7.1 %
Neutro Abs: 3764 cells/uL (ref 1500–7800)
Neutrophils Relative %: 69.7 %
Platelets: 286 10*3/uL (ref 140–400)
RBC: 4.42 10*6/uL (ref 3.80–5.10)
RDW: 12.9 % (ref 11.0–15.0)
Total Lymphocyte: 22.2 %
WBC: 5.4 10*3/uL (ref 3.8–10.8)

## 2020-05-28 LAB — VITAMIN D 25 HYDROXY (VIT D DEFICIENCY, FRACTURES): Vit D, 25-Hydroxy: 11 ng/mL — ABNORMAL LOW (ref 30–100)

## 2020-05-28 LAB — T4, FREE: Free T4: 0.9 ng/dL (ref 0.8–1.8)

## 2020-05-28 LAB — HEPATITIS C ANTIBODY
Hepatitis C Ab: NONREACTIVE
SIGNAL TO CUT-OFF: 0.24 (ref <1.00)

## 2020-05-28 LAB — HEMOGLOBIN A1C
Hgb A1c MFr Bld: 5.5 % of total Hgb (ref <5.7)
Mean Plasma Glucose: 111 mg/dL
eAG (mmol/L): 6.2 mmol/L

## 2020-05-28 LAB — LIPID PANEL
Cholesterol: 210 mg/dL — ABNORMAL HIGH (ref <200)
HDL: 85 mg/dL (ref >=50)
LDL Cholesterol (Calc): 111 mg/dL (calc) — ABNORMAL HIGH
Non-HDL Cholesterol (Calc): 125 mg/dL (calc) (ref <130)
Total CHOL/HDL Ratio: 2.5 (calc) (ref <5.0)
Triglycerides: 59 mg/dL (ref <150)

## 2020-05-28 LAB — TSH: TSH: 0.65 mIU/L

## 2020-05-28 LAB — T3, FREE: T3, Free: 2.8 pg/mL (ref 2.3–4.2)

## 2020-06-22 IMAGING — CT CT ANGIOGRAPHY CHEST
2 of 6 series · 17 of 46 positions shown · IV contrast (omnipaque)
Comparison: None.

CLINICAL DATA: Chest tightness

EXAM:
CT ANGIOGRAPHY CHEST WITH CONTRAST
TECHNIQUE: Multidetector CT imaging of the chest was performed using the
standard protocol during bolus administration of intravenous
contrast. Multiplanar CT image reconstructions and MIPs were
obtained to evaluate the vascular anatomy.
CONTRAST:  100mL OMNIPAQUE IOHEXOL 350 MG/ML SOLN

[Series 5: thins · axial · 0.66mm/px · z∈[+1311,+1489]mm · 14 of 196 slices shown]
[im 9/196  lung]
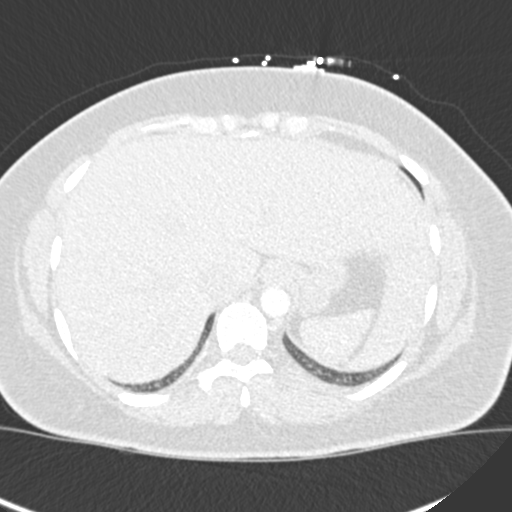
[im 26/196  soft-tissue]
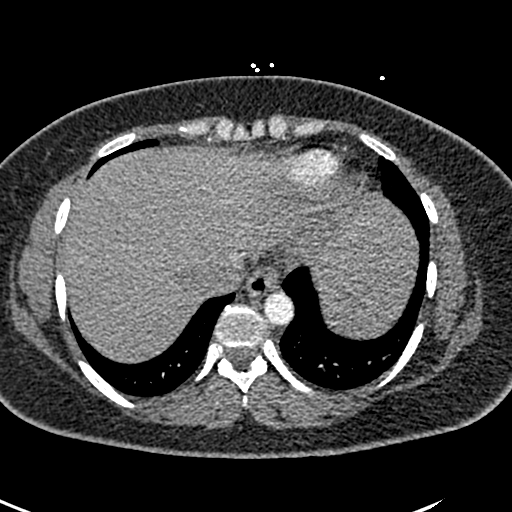
[im 34/196  lung]
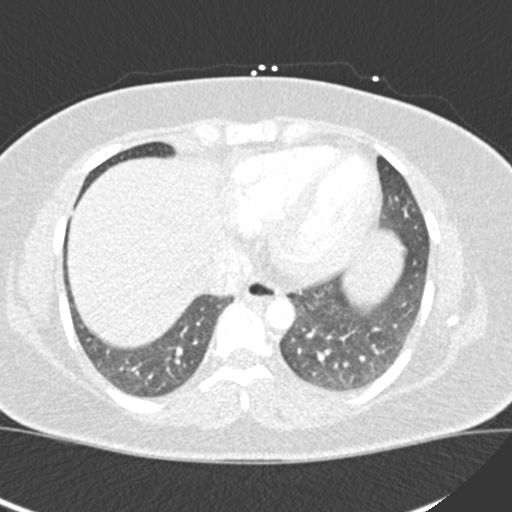
[im 51/196  soft-tissue]
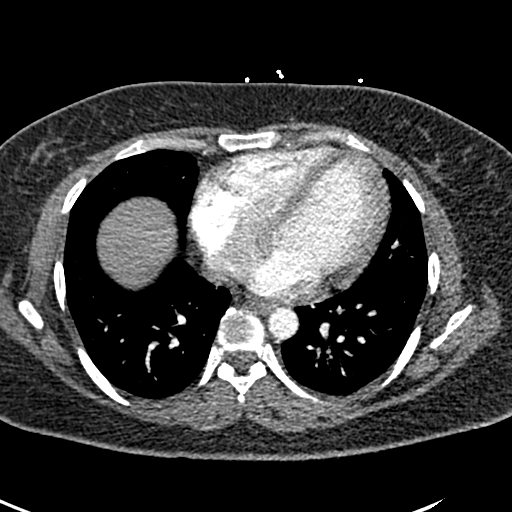
[im 68/196  lung]
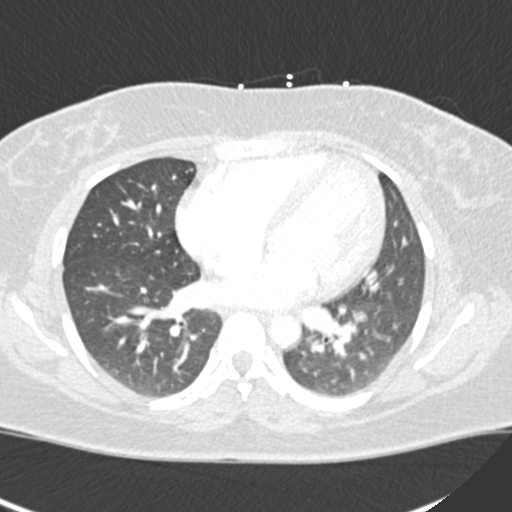
[im 77/196  soft-tissue]
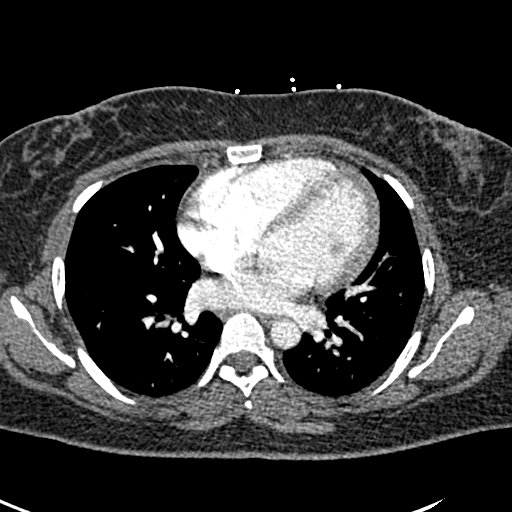
[im 94/196  lung]
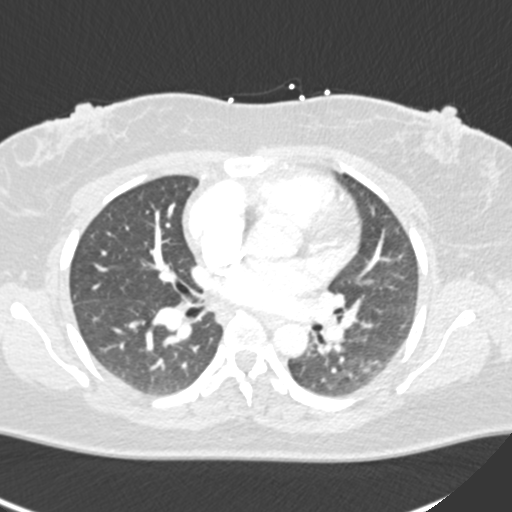
[im 102/196  soft-tissue]
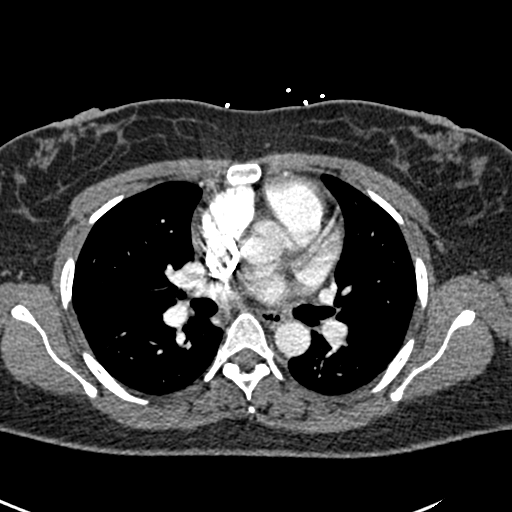
[im 119/196  lung]
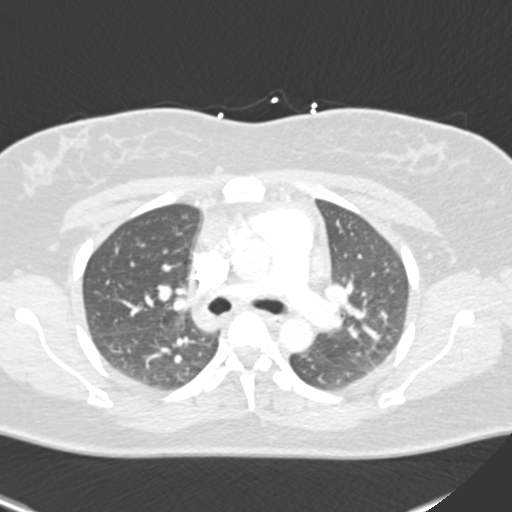
[im 128/196  soft-tissue]
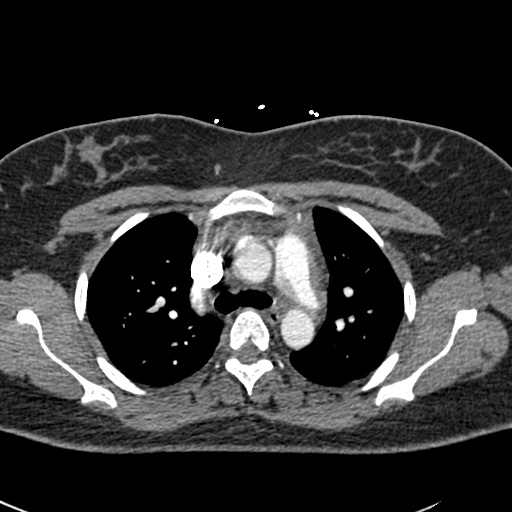
[im 145/196  lung]
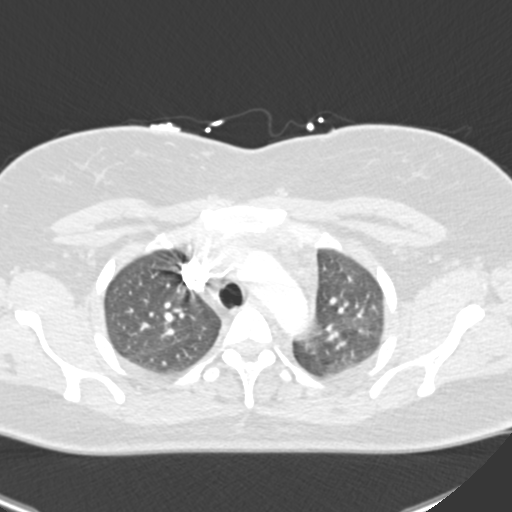
[im 162/196  soft-tissue]
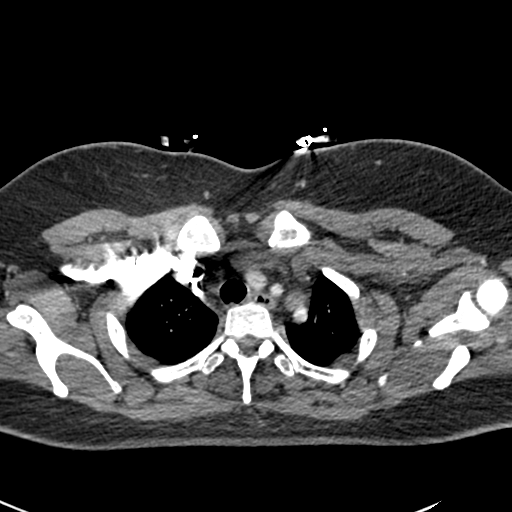
[im 170/196  lung]
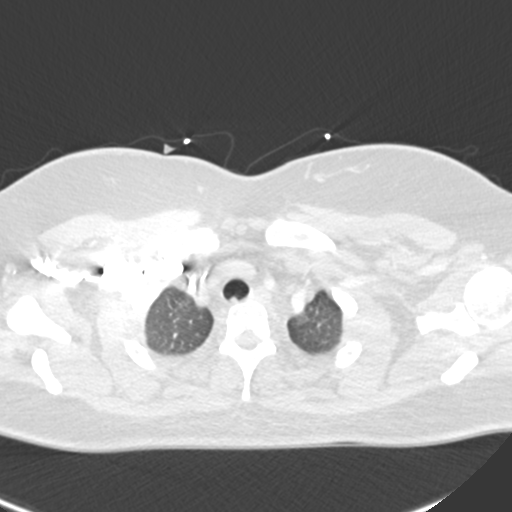
[im 187/196  soft-tissue]
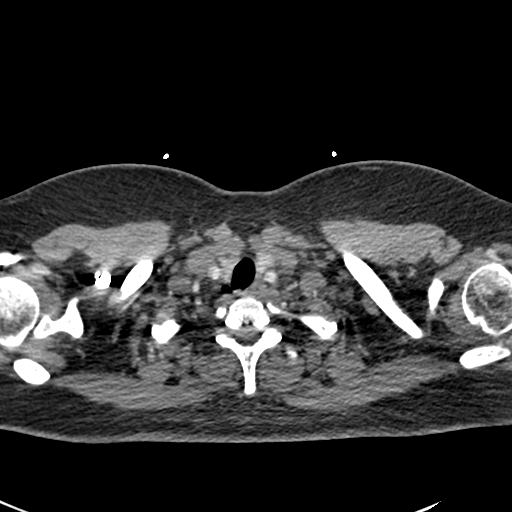

[Series 8: coronal mpr · coronal · 0.40mm/px · 3 of 110 slices shown]
[im 28/110  soft-tissue]
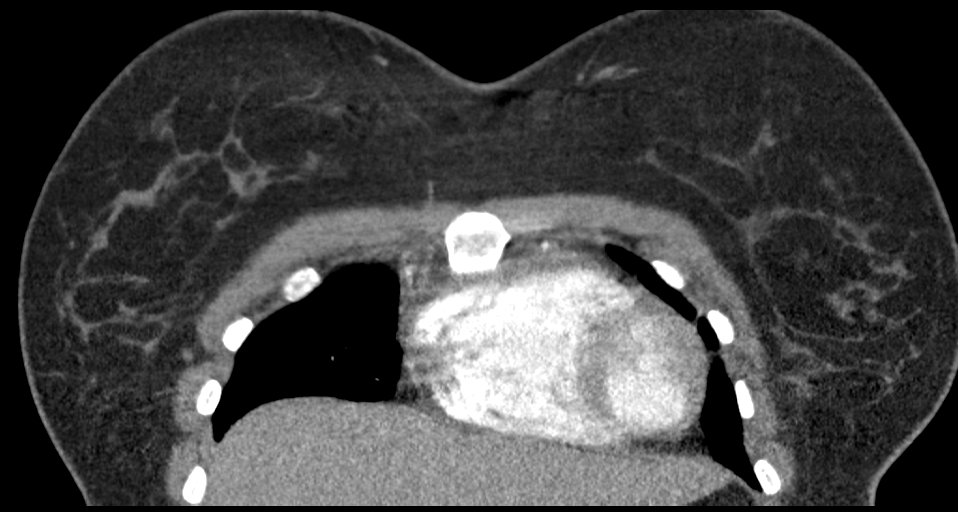
[im 55/110  soft-tissue]
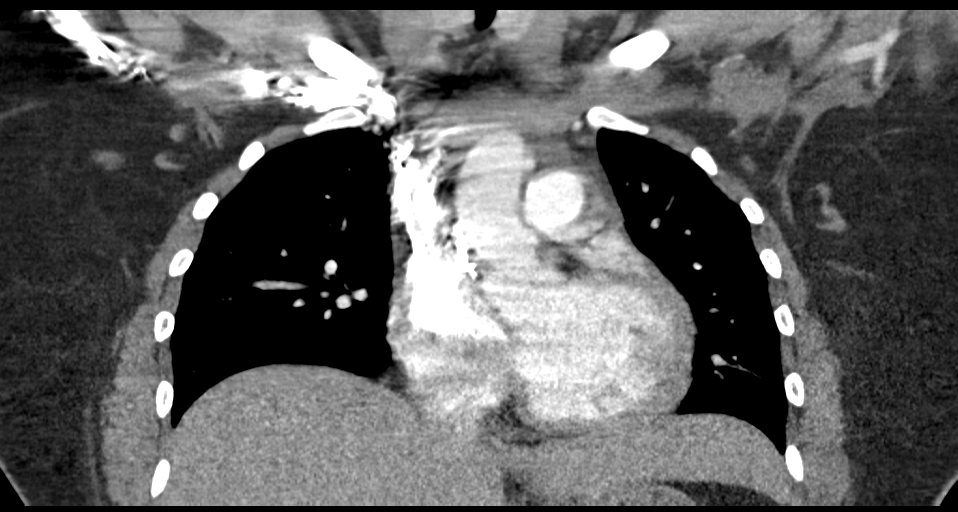
[im 82/110  soft-tissue]
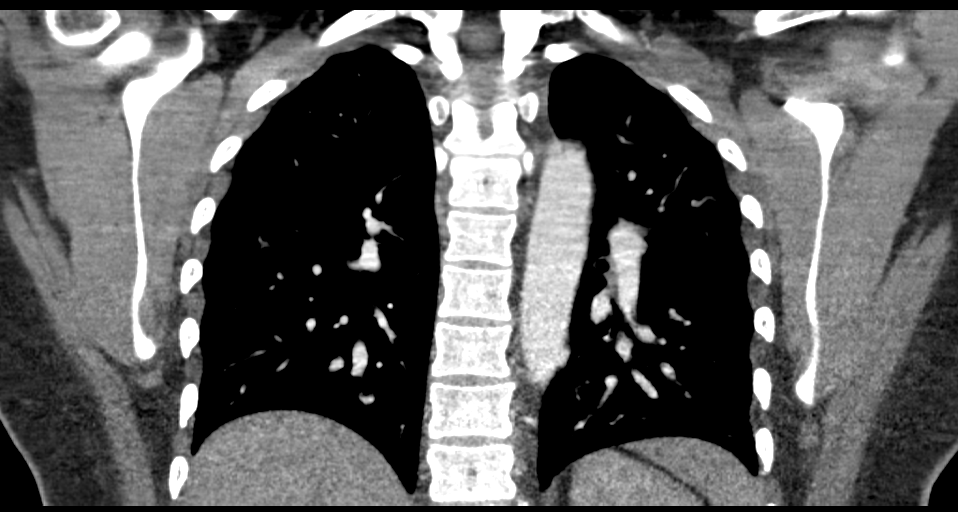

[17 of 46 positions shown; findings below may reference images not displayed]

FINDINGS: Cardiovascular: Satisfactory opacification of the pulmonary arteries
to the segmental level. No evidence of pulmonary embolism. Normal
heart size. No pericardial effusion.

Mediastinum/Nodes: Small residual thymus.  Negative for adenopathy

Lungs/Pleura: There is no edema, consolidation, effusion, or
pneumothorax. Small incidental tracheal diverticulum rightward at
the thoracic inlet.

Upper Abdomen: Negative

Musculoskeletal: Negative

Review of the MIP images confirms the above findings.
IMPRESSION: Negative chest CTA.

## 2020-07-09 ENCOUNTER — Encounter (INDEPENDENT_AMBULATORY_CARE_PROVIDER_SITE_OTHER): Payer: Self-pay | Admitting: Nurse Practitioner

## 2020-07-09 ENCOUNTER — Encounter (INDEPENDENT_AMBULATORY_CARE_PROVIDER_SITE_OTHER): Payer: BC Managed Care – PPO | Admitting: Nurse Practitioner

## 2020-07-09 NOTE — Telephone Encounter (Signed)
This encounter was created in error - please disregard.

## 2020-08-13 ENCOUNTER — Encounter: Payer: Self-pay | Admitting: Emergency Medicine

## 2020-08-13 ENCOUNTER — Ambulatory Visit
Admission: EM | Admit: 2020-08-13 | Discharge: 2020-08-13 | Disposition: A | Discharge status: Home / Self Care | Payer: Self-pay | Attending: Emergency Medicine | Admitting: Emergency Medicine

## 2020-08-13 ENCOUNTER — Other Ambulatory Visit: Payer: Self-pay

## 2020-08-13 DIAGNOSIS — R110 Nausea: Secondary | ICD-10-CM | POA: Insufficient documentation

## 2020-08-13 DIAGNOSIS — R5383 Other fatigue: Secondary | ICD-10-CM | POA: Insufficient documentation

## 2020-08-13 DIAGNOSIS — R350 Frequency of micturition: Secondary | ICD-10-CM | POA: Insufficient documentation

## 2020-08-13 LAB — POCT URINALYSIS DIP (MANUAL ENTRY)
Bilirubin, UA: NEGATIVE
Blood, UA: NEGATIVE
Glucose, UA: NEGATIVE mg/dL
Ketones, POC UA: NEGATIVE mg/dL
Nitrite, UA: NEGATIVE
Protein Ur, POC: NEGATIVE mg/dL
Spec Grav, UA: >=1.03 — AB (ref 1.010–1.025)
Urobilinogen, UA: 0.2 E.U./dL
pH, UA: 5.5 (ref 5.0–8.0)

## 2020-08-13 LAB — POCT URINE PREGNANCY: Preg Test, Ur: NEGATIVE

## 2020-08-13 MED ORDER — ONDANSETRON HCL 4 MG PO TABS: 4.0000 mg | ORAL_TABLET | Freq: Four times a day (QID) | ORAL | 0 refills | Status: DC

## 2020-08-13 NOTE — ED Triage Notes (Signed)
Feels tired, frequent urination, nauseated,  Did home covid test that was negative.

## 2020-08-13 NOTE — Discharge Instructions (Signed)
Urine showed signs of dehydration Push fluids. Supplement with pedialyte Urine culture sent.  We will call you with the results.   Zofran prescribed for nausea Follow up with PCP for recheck and to ensure symptoms are improving Return here or go to ER if you have any new or worsening symptoms such as fever, worsening abdominal pain, nausea/vomiting, flank pain, etc..Marland Kitchen

## 2020-08-13 NOTE — ED Provider Notes (Signed)
[facility]   CC: fatigue, nausea, frequent urination  SUBJECTIVE:  April Tyler is a 30 y.o. female who complains of fatigue, nausea, and frequent urination x few days.  Patient denies a precipitating event, changes in activity or diet.  Denies alleviating or aggravating factors.  Denies similar symptoms in the past.  Denies fever, chills, vomiting, abdominal pain, flank pain, abnormal vaginal discharge or bleeding, hematuria.    LMP: Patient's last menstrual period was 07/26/2020.  ROS: As in HPI.  All other pertinent ROS negative.     Past Medical History:  Diagnosis Date  . Burning with urination 01/31/2014  . BV (bacterial vaginosis) 01/31/2014  . Contraceptive management 11/29/2013  . Exposure to chlamydia 03/13/2015  . History of elective abortion 11/29/2013  . Induced abortion 08/21/15  . Screening for STD (sexually transmitted disease) 09/11/2015  . UTI (lower urinary tract infection)   . Vaginal discharge 11/29/2013  . Vitamin D deficiency    Past Surgical History:  Procedure Laterality Date  . elective abortion    . elective abortion  08/21/15   No Known Allergies No current facility-administered medications on file prior to encounter.   Current Outpatient Medications on File Prior to Encounter  Medication Sig Dispense Refill  . acetaminophen (TYLENOL) 500 MG tablet Take 500 mg by mouth every 6 (six) hours as needed for mild pain or headache.    . [DISCONTINUED] omeprazole (PRILOSEC) 20 MG capsule Take 1 capsule (20 mg total) by mouth daily. 90 capsule 0  . [DISCONTINUED] sertraline (ZOLOFT) 25 MG tablet Take 1 tablet (25 mg total) by mouth daily. 90 tablet 0   Social History   Socioeconomic History  . Marital status: Single    Spouse name: Not on file  . Number of children: Not on file  . Years of education: Not on file  . Highest education level: Not on file  Occupational History  . Not on file  Tobacco Use  . Smoking status: Never Smoker  .  Smokeless tobacco: Never Used  Vaping Use  . Vaping Use: Never used  Substance and Sexual Activity  . Alcohol use: No    Comment: occ; not now  . Drug use: No  . Sexual activity: Not Currently    Birth control/protection: None  Other Topics Concern  . Not on file  Social History Narrative  . Not on file   Social Determinants of Health   Financial Resource Strain: Not on file  Food Insecurity: Not on file  Transportation Needs: Not on file  Physical Activity: Not on file  Stress: Not on file  Social Connections: Not on file  Intimate Partner Violence: Not on file   Family History  Problem Relation Age of Onset  . Other Father        B 12 Deficiency  . Asthma Daughter   . Hypertension Maternal Grandmother   . Obesity Paternal Grandmother   . Other Sister        B 12 Defficiency  . Hypertension Maternal Aunt     OBJECTIVE:  Vitals:   08/13/20 1805  BP: 123/85  Pulse: 94  Resp: 16  Temp: 98.8 F (37.1 C)  TempSrc: Oral  SpO2: 99%   General appearance: Alert in no acute distress HEENT: NCAT.  Oropharynx clear.  Lungs: clear to auscultation bilaterally without adventitious breath sounds Heart: regular rate and rhythm.   Abdomen: soft; non-distended; no tenderness; bowel sounds present; no guarding Back: no CVA tenderness Extremities: no edema;  symmetrical with no gross deformities Skin: warm and dry Neurologic: Ambulates from chair to exam table without difficulty Psychological: alert and cooperative; normal mood and affect  Labs Reviewed  POCT URINALYSIS DIP (MANUAL ENTRY) - Abnormal; Notable for the following components:      Result Value   Spec Grav, UA >=1.030 (*)    Leukocytes, UA Small (1+) (*)    All other components within normal limits  URINE CULTURE  POCT URINE PREGNANCY    ASSESSMENT & PLAN:  1. Urinary frequency   2. Other fatigue   3. Nausea without vomiting     Meds ordered this encounter  Medications  . ondansetron (ZOFRAN) 4 MG  tablet    Sig: Take 1 tablet (4 mg total) by mouth every 6 (six) hours.    Dispense:  12 tablet    Refill:  0    Order Specific Question:   Supervising Provider    Answer:   Eustace Moore [[id number]]   Urine pregnancy negative Urine showed signs of dehydration Push fluids. Supplement with pedialyte Urine culture sent.  We will call you with the results.   Zofran prescribed for nausea Follow up with PCP for recheck and to ensure symptoms are improving Return here or go to ER if you have any new or worsening symptoms such as fever, worsening abdominal pain, nausea/vomiting, flank pain, etc...  Outlined signs and symptoms indicating need for more acute intervention. Patient verbalized understanding. After Visit Summary given.     Rennis Harding, PA-C 08/13/20 1840

## 2020-08-15 LAB — URINE CULTURE: Culture: >=100000 — AB

## 2020-08-18 ENCOUNTER — Encounter (INDEPENDENT_AMBULATORY_CARE_PROVIDER_SITE_OTHER): Payer: Self-pay | Admitting: Nurse Practitioner

## 2020-10-06 ENCOUNTER — Ambulatory Visit (INDEPENDENT_AMBULATORY_CARE_PROVIDER_SITE_OTHER): Payer: Self-pay | Admitting: Nurse Practitioner

## 2020-10-13 ENCOUNTER — Encounter (INDEPENDENT_AMBULATORY_CARE_PROVIDER_SITE_OTHER): Payer: Self-pay | Admitting: Nurse Practitioner

## 2020-10-13 ENCOUNTER — Other Ambulatory Visit: Payer: Self-pay

## 2020-10-13 ENCOUNTER — Ambulatory Visit (INDEPENDENT_AMBULATORY_CARE_PROVIDER_SITE_OTHER): Payer: BC Managed Care – PPO | Admitting: Nurse Practitioner

## 2020-10-13 VITALS — BP 133/75 | HR 77 | Temp 97.8°F | Resp 18 | Wt 231.8 lb

## 2020-10-13 DIAGNOSIS — F419 Anxiety disorder, unspecified: Secondary | ICD-10-CM | POA: Insufficient documentation

## 2020-10-13 DIAGNOSIS — E559 Vitamin D deficiency, unspecified: Secondary | ICD-10-CM | POA: Diagnosis not present

## 2020-10-13 DIAGNOSIS — R300 Dysuria: Secondary | ICD-10-CM

## 2020-10-13 DIAGNOSIS — R0683 Snoring: Secondary | ICD-10-CM

## 2020-10-13 DIAGNOSIS — E785 Hyperlipidemia, unspecified: Secondary | ICD-10-CM

## 2020-10-13 MED ORDER — ESCITALOPRAM OXALATE 5 MG PO TABS: 5.0000 mg | ORAL_TABLET | Freq: Every day | ORAL | 0 refills | Status: DC

## 2020-10-13 NOTE — Progress Notes (Signed)
Subjective:  Patient ID: April Tyler, female    DOB: 23-Jun-1990  Age: 30 y.o. MRN: [id number]  CC:  Chief Complaint  Patient presents with  . Dysuria  . Anxiety  . Hyperlipidemia  . Other    Vitamin D deficiency, snoring      HPI  This patient arrives today for the above.  Dysuria: She reports some recent urinary frequency and dysuria over the last 2 to 3 days.  She tells me she has not been sexually active so she does not feel she has an STI, however she is concerned she may be developing a UTI.  Anxiety: She continues to have anxiety.  She was placed on Zoloft but this made her feel very drowsy and so she stopped taking it.  She does feel that her anxiety is bad enough that she would like to try different agent if possible.  She denies suicidal thoughts.  Hyperlipidemia: Blood work was collected back in January and it did show mildly elevated cholesterol with an LDL of 111.  At that time she was not fasting and she did mention she was on the keto diet.  Vitamin D deficiency: Vitamin D level was 11 we checked it in January and she was told to start 2000 IUs of vitamin D3 daily.  She tells me she has started and has noticed that her energy level is improved since starting the vitamin D supplement.   Snoring: She also reports that she snores at night and is wondering if she could possibly have sleep apnea.  She tells me she thinks that this may be contributing to her daytime fatigue.  She also reports that she was told as a child she had enlarged tonsils and adenoids and was not sure if this could be contributing as well.  Past Medical History:  Diagnosis Date  . Burning with urination 01/31/2014  . BV (bacterial vaginosis) 01/31/2014  . Contraceptive management 11/29/2013  . Exposure to chlamydia 03/13/2015  . History of elective abortion 11/29/2013  . Induced abortion 08/21/15  . Screening for STD (sexually transmitted disease) 09/11/2015  . UTI (lower urinary tract  infection)   . Vaginal discharge 11/29/2013  . Vitamin D deficiency       Family History  Problem Relation Age of Onset  . Other Father        B 12 Deficiency  . Asthma Daughter   . Hypertension Maternal Grandmother   . Obesity Paternal Grandmother   . Other Sister        B 12 Defficiency  . Hypertension Maternal Aunt     Social History   Social History Narrative  . Not on file   Social History   Tobacco Use  . Smoking status: Never Smoker  . Smokeless tobacco: Never Used  Substance Use Topics  . Alcohol use: No    Comment: occ; not now     Current Meds  Medication Sig  . acetaminophen (TYLENOL) 500 MG tablet Take 500 mg by mouth every 6 (six) hours as needed for mild pain or headache.  . Cholecalciferol (VITAMIN D3) 25 MCG (1000 UT) CAPS Take 1 capsule by mouth daily.  Marland Kitchen escitalopram (LEXAPRO) 5 MG tablet Take 1 tablet (5 mg total) by mouth daily.  . ondansetron (ZOFRAN) 4 MG tablet Take 1 tablet (4 mg total) by mouth every 6 (six) hours.    ROS:  Review of Systems  Constitutional: Negative for fever.  HENT:       (+)  Snoring  Respiratory: Negative for shortness of breath.   Cardiovascular: Negative for chest pain.  Genitourinary: Positive for dysuria and frequency.  Psychiatric/Behavioral: Negative for suicidal ideas. The patient is nervous/anxious. The patient does not have insomnia.      Objective:   Today's Vitals: BP 133/75 (BP Location: Left Arm, Patient Position: Sitting, Cuff Size: Normal)   Pulse 77   Temp 97.8 F (36.6 C) (Temporal)   Resp 18   Wt 231 lb 12.8 oz (105.1 kg)   SpO2 99%   BMI 35.14 kg/m  Vitals with BMI 10/13/2020 08/13/2020 05/27/2020  Height - - 5' 8.1"  Weight 231 lbs 13 oz - 225 lbs 6 oz  BMI - - 50.09  Systolic 381 829 937  Diastolic 75 85 80  Pulse 77 94 82     Physical Exam Vitals reviewed.  Constitutional:      General: She is not in acute distress.    Appearance: Normal appearance.  HENT:     Head:  Normocephalic and atraumatic.     Mouth/Throat:     Tonsils: 2+ on the right. 2+ on the left.  Neck:     Vascular: No carotid bruit.  Cardiovascular:     Rate and Rhythm: Normal rate and regular rhythm.     Pulses: Normal pulses.     Heart sounds: Normal heart sounds.  Pulmonary:     Effort: Pulmonary effort is normal.     Breath sounds: Normal breath sounds.  Skin:    General: Skin is warm and dry.  Neurological:     General: No focal deficit present.     Mental Status: She is alert and oriented to person, place, and time.  Psychiatric:        Mood and Affect: Mood normal.        Behavior: Behavior normal.        Judgment: Judgment normal.          Assessment and Plan   1. Anxiety   2. Vitamin D deficiency   3. Snoring   4. Dysuria   5. Hyperlipidemia, unspecified hyperlipidemia type      Plan: 1.  We will keep her off of Zoloft and try Lexapro.  She will follow-up with me for a telephonic visit in about 6 weeks to discuss how she is tolerating Lexapro. 2.  We will check serum vitamin D level for further evaluation. 3.  We discussed that her tonsils are slightly enlarged, but she may want to still consider getting tested for sleep apnea because if we send her to ENT and they do remove her tonsils if she still has sleep apnea symptoms may not resolve with simple removal of tonsils.  She tells me she is agreeable to this and refer her to neurology to have sleep study done. 4.  We will check urine for signs of UTI.  She was encouraged to consider taking over-the-counter Azo as needed for symptom management until we get the results. 5.  We did discuss her mildly elevated LDL.  I made some recommendations regarding dietary changes and recommend she consider trying the Mediterranean diet.  She tells me she understands and will consider this.   Tests ordered Orders Placed This Encounter  Procedures  . Vitamin D, 25-hydroxy  . CMP with eGFR([facility])  . Urinalysis with  Culture Reflex  . Ambulatory referral to Neurology      Meds ordered this encounter  Medications  . escitalopram (LEXAPRO) 5 MG tablet  Sig: Take 1 tablet (5 mg total) by mouth daily.    Dispense:  90 tablet    Refill:  0    Order Specific Question:   Supervising Provider    Answer:   Doree Albee [[id number]]    Patient to follow-up in 6 weeks for virtual visit to discuss how well she is tolerating the Lexapro, or sooner as needed.  Ailene Ards, NP

## 2020-10-13 NOTE — Patient Instructions (Signed)

## 2020-10-14 ENCOUNTER — Other Ambulatory Visit (INDEPENDENT_AMBULATORY_CARE_PROVIDER_SITE_OTHER): Payer: Self-pay | Admitting: Nurse Practitioner

## 2020-10-14 LAB — URINALYSIS W MICROSCOPIC + REFLEX CULTURE
Bacteria, UA: NONE SEEN /HPF
Bilirubin Urine: NEGATIVE
Glucose, UA: NEGATIVE
Hgb urine dipstick: NEGATIVE
Hyaline Cast: NONE SEEN /LPF
Ketones, ur: NEGATIVE
Leukocyte Esterase: NEGATIVE
Nitrites, Initial: NEGATIVE
Protein, ur: NEGATIVE
RBC / HPF: NONE SEEN /HPF (ref 0–2)
Specific Gravity, Urine: 1.021 (ref 1.001–1.035)
WBC, UA: NONE SEEN /HPF (ref 0–5)
pH: 6 (ref 5.0–8.0)

## 2020-10-14 LAB — COMPLETE METABOLIC PANEL WITH GFR
AG Ratio: 1.5 (calc) (ref 1.0–2.5)
ALT: 9 U/L (ref 6–29)
AST: 13 U/L (ref 10–30)
Albumin: 4.3 g/dL (ref 3.6–5.1)
Alkaline phosphatase (APISO): 42 U/L (ref 31–125)
BUN: 13 mg/dL (ref 7–25)
CO2: 27 mmol/L (ref 20–32)
Calcium: 9.5 mg/dL (ref 8.6–10.2)
Chloride: 104 mmol/L (ref 98–110)
Creat: 0.7 mg/dL (ref 0.50–1.10)
GFR, Est African American: 134 mL/min/{1.73_m2} (ref >=60)
GFR, Est Non African American: 115 mL/min/{1.73_m2} (ref >=60)
Globulin: 2.9 g/dL (calc) (ref 1.9–3.7)
Glucose, Bld: 84 mg/dL (ref 65–139)
Potassium: 4.2 mmol/L (ref 3.5–5.3)
Sodium: 138 mmol/L (ref 135–146)
Total Bilirubin: 0.5 mg/dL (ref 0.2–1.2)
Total Protein: 7.2 g/dL (ref 6.1–8.1)

## 2020-10-14 LAB — VITAMIN D 25 HYDROXY (VIT D DEFICIENCY, FRACTURES): Vit D, 25-Hydroxy: 25 ng/mL — ABNORMAL LOW (ref 30–100)

## 2020-10-14 LAB — NO CULTURE INDICATED

## 2020-10-15 NOTE — Progress Notes (Signed)
Pt called and ask if she check her mychart message from provider? Pt said no, so lab results and instructions was given to take Vit-D3, 2000iu /day . Also if she is still having UTI symptoms, to please schedule a appt with her OBGYN. She showed no UTI in lab results .

## 2020-11-19 ENCOUNTER — Encounter (INDEPENDENT_AMBULATORY_CARE_PROVIDER_SITE_OTHER): Payer: Self-pay | Admitting: Nurse Practitioner

## 2020-11-19 ENCOUNTER — Other Ambulatory Visit (INDEPENDENT_AMBULATORY_CARE_PROVIDER_SITE_OTHER): Payer: Self-pay | Admitting: Nurse Practitioner

## 2020-11-19 DIAGNOSIS — N760 Acute vaginitis: Secondary | ICD-10-CM

## 2020-11-19 DIAGNOSIS — B9689 Other specified bacterial agents as the cause of diseases classified elsewhere: Secondary | ICD-10-CM

## 2020-11-19 MED ORDER — METRONIDAZOLE 500 MG PO TABS: 500.0000 mg | ORAL_TABLET | Freq: Two times a day (BID) | ORAL | 0 refills | Status: AC

## 2020-11-26 ENCOUNTER — Ambulatory Visit (INDEPENDENT_AMBULATORY_CARE_PROVIDER_SITE_OTHER): Payer: BC Managed Care – PPO | Admitting: Nurse Practitioner

## 2020-12-08 ENCOUNTER — Ambulatory Visit (INDEPENDENT_AMBULATORY_CARE_PROVIDER_SITE_OTHER): Payer: BC Managed Care – PPO | Admitting: Nurse Practitioner

## 2020-12-15 ENCOUNTER — Encounter: Payer: Self-pay | Admitting: Neurology

## 2020-12-15 ENCOUNTER — Institutional Professional Consult (permissible substitution): Payer: BC Managed Care – PPO | Admitting: Neurology

## 2021-01-20 ENCOUNTER — Ambulatory Visit
Admission: EM | Admit: 2021-01-20 | Discharge: 2021-01-20 | Disposition: A | Discharge status: Home / Self Care | Payer: BC Managed Care – PPO | Attending: Family Medicine | Admitting: Family Medicine

## 2021-01-20 ENCOUNTER — Encounter: Payer: Self-pay | Admitting: Emergency Medicine

## 2021-01-20 ENCOUNTER — Other Ambulatory Visit: Payer: Self-pay

## 2021-01-20 DIAGNOSIS — B9689 Other specified bacterial agents as the cause of diseases classified elsewhere: Secondary | ICD-10-CM | POA: Insufficient documentation

## 2021-01-20 DIAGNOSIS — N760 Acute vaginitis: Secondary | ICD-10-CM | POA: Insufficient documentation

## 2021-01-20 DIAGNOSIS — Z113 Encounter for screening for infections with a predominantly sexual mode of transmission: Secondary | ICD-10-CM | POA: Diagnosis not present

## 2021-01-20 DIAGNOSIS — R1032 Left lower quadrant pain: Secondary | ICD-10-CM | POA: Diagnosis not present

## 2021-01-20 LAB — POCT URINALYSIS DIP (MANUAL ENTRY)
Bilirubin, UA: NEGATIVE
Blood, UA: NEGATIVE
Glucose, UA: NEGATIVE mg/dL
Ketones, POC UA: NEGATIVE mg/dL
Leukocytes, UA: NEGATIVE
Nitrite, UA: NEGATIVE
Protein Ur, POC: NEGATIVE mg/dL
Spec Grav, UA: >=1.03 — AB (ref 1.010–1.025)
Urobilinogen, UA: 0.2 E.U./dL
pH, UA: 6.5 (ref 5.0–8.0)

## 2021-01-20 LAB — POCT URINE PREGNANCY: Preg Test, Ur: NEGATIVE

## 2021-01-20 NOTE — ED Triage Notes (Signed)
Frequency urination and left flank pain, some burning with urination x 1 week, also wants to be check for STD

## 2021-01-20 NOTE — Discharge Instructions (Signed)
We have sent testing for sexually transmitted infections. We will notify you of any positive results once they are received. If required, we will prescribe any medications you might need.  Please refrain from all sexual activity for at least the next seven days.  

## 2021-01-21 LAB — CERVICOVAGINAL ANCILLARY ONLY
Bacterial Vaginitis (gardnerella): POSITIVE — AB
Candida Glabrata: NEGATIVE
Candida Vaginitis: NEGATIVE
Chlamydia: NEGATIVE
Comment: NEGATIVE
Comment: NEGATIVE
Comment: NEGATIVE
Comment: NEGATIVE
Comment: NEGATIVE
Comment: NORMAL
Neisseria Gonorrhea: NEGATIVE
Trichomonas: NEGATIVE

## 2021-01-21 NOTE — ED Provider Notes (Signed)
[facility]   [id number] 01/20/21 Arrival Time: 1741  ASSESSMENT & PLAN:  1. Left lower quadrant abdominal pain    Vaginal cytology pending.  Benign abdominal exam. No indications for urgent abdominal/pelvic imaging at this time. Discussed. She is comfortable with home observatin.    Discharge Instructions      We have sent testing for sexually transmitted infections. We will notify you of any positive results once they are received. If required, we will prescribe any medications you might need.  Please refrain from all sexual activity for at least the next seven days.     Follow-up Information     April Paddy, NP.   Specialty: Nurse Practitioner Why: If failing to improve as anticipated. Contact information: [address] [place] [address] [phone]         [facility] EMERGENCY DEPARTMENT.   Specialty: Emergency Medicine Why: If symptoms worsen in any way. Contact information: [id number] [place] [address] [phone]               Reviewed expectations re: course of current medical issues. Questions answered. Outlined signs and symptoms indicating need for more acute intervention. Patient verbalized understanding. After Visit Summary given.   SUBJECTIVE: History from: patient. April Tyler is a 30 y.o. female who presents with complaint of urinary freq; x 1 wk; would like STD testing. No specific vaginal discharge. Also reports LLQ pain yesterday; better today. Afebrile. Normal PO intake without n/v/d.  Patient's last menstrual period was 12/28/2020 (exact date).  Past Surgical History:  Procedure Laterality Date   elective abortion     elective abortion  08/21/15     OBJECTIVE:  Vitals:   01/20/21 1757  BP: 131/78  Pulse: 71  Resp: 16  Temp: 98.8 F (37.1 C)  TempSrc: Oral  SpO2: 98%    General appearance: alert, oriented, no acute distress HEENT:  [place]; AT; oropharynx moist Lungs: unlabored respirations Abdomen: soft; without distention; no specific tenderness to palpation; normal bowel sounds; without masses or organomegaly; without guarding or rebound tenderness Back: without reported CVA tenderness; FROM at waist Extremities: without LE edema; symmetrical; without gross deformities Skin: warm and dry Neurologic: normal gait Psychological: alert and cooperative; normal mood and affect  Labs: Results for orders placed or performed during the hospital encounter of 01/20/21  POCT urinalysis dipstick  Result Value Ref Range   Color, UA yellow yellow   Clarity, UA clear clear   Glucose, UA negative negative mg/dL   Bilirubin, UA negative negative   Ketones, POC UA negative negative mg/dL   Spec Grav, UA >=8.144 (A) 1.010 - 1.025   Blood, UA negative negative   pH, UA 6.5 5.0 - 8.0   Protein Ur, POC negative negative mg/dL   Urobilinogen, UA 0.2 0.2 or 1.0 E.U./dL   Nitrite, UA Negative Negative   Leukocytes, UA Negative Negative  POCT urine pregnancy  Result Value Ref Range   Preg Test, Ur Negative Negative  Cervicovaginal ancillary only   No Known Allergies                                             Past Medical History:  Diagnosis Date   Burning with urination 01/31/2014   BV (bacterial vaginosis) 01/31/2014   Contraceptive management 11/29/2013   Exposure to chlamydia 03/13/2015  History of elective abortion 11/29/2013   Induced abortion 08/21/15   Screening for STD (sexually transmitted disease) 09/11/2015   UTI (lower urinary tract infection)    Vaginal discharge 11/29/2013   Vitamin D deficiency     Social History   Socioeconomic History   Marital status: Single    Spouse name: Not on file   Number of children: Not on file   Years of education: Not on file   Highest education level: Not on file  Occupational History   Not on file  Tobacco Use   Smoking status: Never   Smokeless tobacco: Never  Vaping Use    Vaping Use: Never used  Substance and Sexual Activity   Alcohol use: No    Comment: occ; not now   Drug use: No   Sexual activity: Not Currently    Birth control/protection: None  Other Topics Concern   Not on file  Social History Narrative   Not on file   Social Determinants of Health   Financial Resource Strain: Not on file  Food Insecurity: Not on file  Transportation Needs: Not on file  Physical Activity: Not on file  Stress: Not on file  Social Connections: Not on file  Intimate Partner Violence: Not on file    Family History  Problem Relation Age of Onset   Other Father        B 12 Deficiency   Asthma Daughter    Hypertension Maternal Grandmother    Obesity Paternal Grandmother    Other Sister        B 12 Defficiency   Hypertension Maternal Renato Gails, MD 01/21/21 1416

## 2021-02-05 ENCOUNTER — Telehealth: Payer: Self-pay | Admitting: Adult Health

## 2021-02-05 NOTE — Telephone Encounter (Signed)
Patient calling stating that

## 2021-02-08 ENCOUNTER — Other Ambulatory Visit: Payer: Self-pay

## 2021-02-08 ENCOUNTER — Telehealth: Payer: Self-pay | Admitting: Adult Health

## 2021-02-08 MED ORDER — VALACYCLOVIR HCL 500 MG PO TABS: 500.0000 mg | ORAL_TABLET | Freq: Two times a day (BID) | ORAL | 1 refills | Status: DC

## 2021-02-08 NOTE — Telephone Encounter (Signed)
Pt requesting refill on Valcyclovir We last ordered for her 09/2018  Please advise & notify pt     [facility]

## 2021-04-22 ENCOUNTER — Ambulatory Visit
Admission: EM | Admit: 2021-04-22 | Discharge: 2021-04-22 | Disposition: A | Discharge status: Home / Self Care | Payer: BC Managed Care – PPO | Attending: Urgent Care | Admitting: Urgent Care

## 2021-04-22 ENCOUNTER — Other Ambulatory Visit: Payer: Self-pay

## 2021-04-22 ENCOUNTER — Encounter: Payer: Self-pay | Admitting: Emergency Medicine

## 2021-04-22 DIAGNOSIS — R350 Frequency of micturition: Secondary | ICD-10-CM | POA: Insufficient documentation

## 2021-04-22 DIAGNOSIS — R300 Dysuria: Secondary | ICD-10-CM | POA: Insufficient documentation

## 2021-04-22 LAB — POCT URINALYSIS DIP (MANUAL ENTRY)
Bilirubin, UA: NEGATIVE
Blood, UA: NEGATIVE
Glucose, UA: NEGATIVE mg/dL
Ketones, POC UA: NEGATIVE mg/dL
Nitrite, UA: NEGATIVE
Protein Ur, POC: NEGATIVE mg/dL
Spec Grav, UA: >=1.03 — AB (ref 1.010–1.025)
Urobilinogen, UA: 1 E.U./dL
pH, UA: 7 (ref 5.0–8.0)

## 2021-04-22 LAB — POCT URINE PREGNANCY: Preg Test, Ur: NEGATIVE

## 2021-04-22 NOTE — ED Provider Notes (Signed)
[facility]   MRN: [id number] DOB: 09-18-1990  Subjective:   April Tyler is a 30 y.o. female presenting for day history of acute onset dysuria, urinary frequency. Had sex once in the past 2 months, was a female partner, no protection. Denies fever, n/v, abdominal pain, pelvic pain, rashes, hematuria, vaginal discharge. Drinks 2 bottles of water daily. Drinks coffee ~4-5 cups per day. Has occasional alcohol.    No current facility-administered medications for this encounter.  Current Outpatient Medications:    escitalopram (LEXAPRO) 5 MG tablet, Take 5 mg by mouth daily., Disp: , Rfl:    acetaminophen (TYLENOL) 500 MG tablet, Take 500 mg by mouth every 6 (six) hours as needed for mild pain or headache., Disp: , Rfl:    Cholecalciferol (VITAMIN D3) 25 MCG (1000 UT) CAPS, Take 2 capsules by mouth daily., Disp: , Rfl:    ondansetron (ZOFRAN) 4 MG tablet, Take 1 tablet (4 mg total) by mouth every 6 (six) hours., Disp: 12 tablet, Rfl: 0   valACYclovir (VALTREX) 500 MG tablet, Take 1 tablet (500 mg total) by mouth 2 (two) times daily., Disp: 60 tablet, Rfl: 1   No Known Allergies  Past Medical History:  Diagnosis Date   Burning with urination 01/31/2014   BV (bacterial vaginosis) 01/31/2014   Contraceptive management 11/29/2013   Exposure to chlamydia 03/13/2015   History of elective abortion 11/29/2013   Induced abortion 08/21/15   Screening for STD (sexually transmitted disease) 09/11/2015   UTI (lower urinary tract infection)    Vaginal discharge 11/29/2013   Vitamin D deficiency      Past Surgical History:  Procedure Laterality Date   elective abortion     elective abortion  08/21/15    Family History  Problem Relation Age of Onset   Other Father        B 12 Deficiency   Asthma Daughter    Hypertension Maternal Grandmother    Obesity Paternal Grandmother    Other Sister        B 12 Defficiency   Hypertension Maternal Aunt     Social History   Tobacco Use    Smoking status: Never   Smokeless tobacco: Never  Vaping Use   Vaping Use: Never used  Substance Use Topics   Alcohol use: No    Comment: occ; not now   Drug use: No    ROS   Objective:   Vitals: BP 129/81 (BP Location: Right Arm)   Pulse 76   Temp 99.2 F (37.3 C) (Oral)   Resp 18   LMP 04/14/2021 (Exact Date)   SpO2 99%   Physical Exam Constitutional:      General: She is not in acute distress.    Appearance: Normal appearance. She is well-developed. She is not ill-appearing, toxic-appearing or diaphoretic.  HENT:     Head: Normocephalic and atraumatic.     Nose: Nose normal.     Mouth/Throat:     Mouth: Mucous membranes are moist.     Pharynx: Oropharynx is clear.  Eyes:     General: No scleral icterus.       Right eye: No discharge.        Left eye: No discharge.     Extraocular Movements: Extraocular movements intact.     Conjunctiva/sclera: Conjunctivae normal.     Pupils: Pupils are equal, round, and reactive to light.  Cardiovascular:     Rate and Rhythm: Normal rate.  Pulmonary:     Effort:  Pulmonary effort is normal.  Abdominal:     General: Bowel sounds are normal. There is no distension.     Palpations: Abdomen is soft. There is no mass.     Tenderness: There is no abdominal tenderness. There is no right CVA tenderness, left CVA tenderness, guarding or rebound.  Skin:    General: Skin is warm and dry.  Neurological:     General: No focal deficit present.     Mental Status: She is alert and oriented to person, place, and time.  Psychiatric:        Mood and Affect: Mood normal.        Behavior: Behavior normal.        Thought Content: Thought content normal.        Judgment: Judgment normal.    Results for orders placed or performed during the hospital encounter of 04/22/21 (from the past 24 hour(s))  POCT urinalysis dipstick     Status: Abnormal   Collection Time: 04/22/21  7:10 PM  Result Value Ref Range   Color, UA yellow yellow    Clarity, UA clear clear   Glucose, UA negative negative mg/dL   Bilirubin, UA negative negative   Ketones, POC UA negative negative mg/dL   Spec Grav, UA >=1.030 (A) 1.010 - 1.025   Blood, UA negative negative   pH, UA 7.0 5.0 - 8.0   Protein Ur, POC negative negative mg/dL   Urobilinogen, UA 1.0 0.2 or 1.0 E.U./dL   Nitrite, UA Negative Negative   Leukocytes, UA Trace (A) Negative  POCT urine pregnancy     Status: None   Collection Time: 04/22/21  7:11 PM  Result Value Ref Range   Preg Test, Ur Negative Negative    Assessment and Plan :   PDMP not reviewed this encounter.  1. Dysuria   2. Urinary frequency    We will base treatment with any kind of antibiotics based off of the results.  For now, emphasized the patient hydrate much better and avoid drinking too much coffee. Counseled patient on potential for adverse effects with medications prescribed/recommended today, ER and return-to-clinic precautions discussed, patient verbalized understanding.     Jaynee Eagles, [place] 04/22/21 1925

## 2021-04-22 NOTE — ED Triage Notes (Signed)
Frequent urination x 2 days.  Some buring on urination.  Wants to be checked for STDs as well.

## 2021-04-22 NOTE — Discharge Instructions (Signed)

## 2021-04-23 ENCOUNTER — Telehealth (HOSPITAL_COMMUNITY): Payer: Self-pay | Admitting: Emergency Medicine

## 2021-04-23 LAB — CERVICOVAGINAL ANCILLARY ONLY
Bacterial Vaginitis (gardnerella): POSITIVE — AB
Candida Glabrata: NEGATIVE
Candida Vaginitis: NEGATIVE
Chlamydia: NEGATIVE
Comment: NEGATIVE
Comment: NEGATIVE
Comment: NEGATIVE
Comment: NEGATIVE
Comment: NEGATIVE
Comment: NORMAL
Neisseria Gonorrhea: NEGATIVE
Trichomonas: NEGATIVE

## 2021-04-23 LAB — URINE CULTURE: Culture: 30000 — AB

## 2021-04-23 MED ORDER — METRONIDAZOLE 500 MG PO TABS: 500.0000 mg | ORAL_TABLET | Freq: Two times a day (BID) | ORAL | 0 refills | Status: DC

## 2021-05-28 ENCOUNTER — Other Ambulatory Visit (INDEPENDENT_AMBULATORY_CARE_PROVIDER_SITE_OTHER): Payer: Self-pay | Admitting: Nurse Practitioner

## 2021-09-23 ENCOUNTER — Encounter: Payer: Self-pay | Admitting: Nurse Practitioner

## 2021-09-23 ENCOUNTER — Ambulatory Visit (INDEPENDENT_AMBULATORY_CARE_PROVIDER_SITE_OTHER): Payer: Self-pay | Admitting: Nurse Practitioner

## 2021-09-23 VITALS — BP 124/72 | HR 87 | Temp 98.7°F | Ht 68.0 in | Wt 242.0 lb

## 2021-09-23 DIAGNOSIS — F419 Anxiety disorder, unspecified: Secondary | ICD-10-CM

## 2021-09-23 DIAGNOSIS — R5383 Other fatigue: Secondary | ICD-10-CM

## 2021-09-23 DIAGNOSIS — E559 Vitamin D deficiency, unspecified: Secondary | ICD-10-CM

## 2021-09-23 DIAGNOSIS — E669 Obesity, unspecified: Secondary | ICD-10-CM

## 2021-09-23 DIAGNOSIS — E66812 Obesity, class 2: Secondary | ICD-10-CM | POA: Insufficient documentation

## 2021-09-23 DIAGNOSIS — R413 Other amnesia: Secondary | ICD-10-CM

## 2021-09-23 DIAGNOSIS — Z6836 Body mass index (BMI) 36.0-36.9, adult: Secondary | ICD-10-CM

## 2021-09-23 DIAGNOSIS — Z136 Encounter for screening for cardiovascular disorders: Secondary | ICD-10-CM

## 2021-09-23 LAB — COMPREHENSIVE METABOLIC PANEL
ALT: 18 U/L (ref 0–35)
AST: 18 U/L (ref 0–37)
Albumin: 4.4 g/dL (ref 3.5–5.2)
Alkaline Phosphatase: 46 U/L (ref 39–117)
BUN: 13 mg/dL (ref 6–23)
CO2: 28 mEq/L (ref 19–32)
Calcium: 9.4 mg/dL (ref 8.4–10.5)
Chloride: 103 mEq/L (ref 96–112)
Creatinine, Ser: 0.77 mg/dL (ref 0.40–1.20)
GFR: 101.83 mL/min (ref >60.00)
Glucose, Bld: 77 mg/dL (ref 70–99)
Potassium: 3.7 mEq/L (ref 3.5–5.1)
Sodium: 138 mEq/L (ref 135–145)
Total Bilirubin: 0.4 mg/dL (ref 0.2–1.2)
Total Protein: 7.6 g/dL (ref 6.0–8.3)

## 2021-09-23 LAB — CBC
HCT: 35.5 % — ABNORMAL LOW (ref 36.0–46.0)
Hemoglobin: 11.4 g/dL — ABNORMAL LOW (ref 12.0–15.0)
MCHC: 32 g/dL (ref 30.0–36.0)
MCV: 81.8 fl (ref 78.0–100.0)
Platelets: 294 10*3/uL (ref 150.0–400.0)
RBC: 4.34 Mil/uL (ref 3.87–5.11)
RDW: 14.8 % (ref 11.5–15.5)
WBC: 5.4 10*3/uL (ref 4.0–10.5)

## 2021-09-23 LAB — LIPID PANEL
Cholesterol: 218 mg/dL — ABNORMAL HIGH (ref 0–200)
HDL: 107.3 mg/dL (ref >39.00)
LDL Cholesterol: 97 mg/dL (ref 0–99)
NonHDL: 110.49
Total CHOL/HDL Ratio: 2
Triglycerides: 65 mg/dL (ref 0.0–149.0)
VLDL: 13 mg/dL (ref 0.0–40.0)

## 2021-09-23 LAB — T4, FREE: Free T4: 0.8 ng/dL (ref 0.60–1.60)

## 2021-09-23 LAB — HEMOGLOBIN A1C: Hgb A1c MFr Bld: 5.7 % (ref 4.6–6.5)

## 2021-09-23 LAB — T3, FREE: T3, Free: 2.4 pg/mL (ref 2.3–4.2)

## 2021-09-23 LAB — TSH: TSH: 1.09 u[IU]/mL (ref 0.35–5.50)

## 2021-09-23 LAB — VITAMIN B12: Vitamin B-12: 315 pg/mL (ref 211–911)

## 2021-09-23 LAB — VITAMIN D 25 HYDROXY (VIT D DEFICIENCY, FRACTURES): VITD: 21.86 ng/mL — ABNORMAL LOW (ref 30.00–100.00)

## 2021-09-23 MED ORDER — DESVENLAFAXINE SUCCINATE ER 50 MG PO TB24: 50.0000 mg | ORAL_TABLET | Freq: Every day | ORAL | 1 refills | Status: DC

## 2021-09-23 NOTE — Assessment & Plan Note (Addendum)
Chronic, I think the majority of her concerns are related to anxiety.  Neurologic exam completely intact, 6CIT screening results normal, GAD 7 elevated (12).  We will also collect blood work for further evaluation, further recommendations may be made based upon these results.  Because patient is quite concerned about possible neurologic abnormality such as MS will refer to neurology for assistance with evaluation, she was told that likelihood of this diagnosis is low.  Per shared decision making we will discontinue Lexapro and patient will start 50 mg of desvenlafaxine by mouth daily.  Patient was warned of possible risk of suicidal ideation and if this were to occur to call our office immediately.  Patient will follow-up in about 1 month for annual physical exam at which point we will also need to discuss how she is tolerating the desvenlafaxine. ?

## 2021-09-23 NOTE — Assessment & Plan Note (Signed)
Further blood work ordered today, further recommendations may be made based upon these results. ?

## 2021-09-23 NOTE — Assessment & Plan Note (Signed)
Chronic, patient to continue on her vitamin D3 supplement.  We will check serum vitamin D level today, further recommendations may be made based upon these results. ?

## 2021-09-23 NOTE — Progress Notes (Signed)
? ?Established Patient Office Visit ? ?Subjective   ?Patient ID: April LericheKourtney A Tyler, female    DOB: 04/05/1991  Age: 31 y.o. MRN: [id number] ? ?Chief Complaint  ?Patient presents with  ? Cognitive concerns  ?  Patient has some memory concerns  ? hip pain  ?  Left side  ? ? ?Patient arrives today for chief complaints as listed above.  It has been approximately 1 year since last time I saw her. ? ?Memory concerns: She reports that she is having a hard time with memory.  When asked to further explain this she tells me that she has a hard time focusing in conversations, she has been feeling fatigued, and has a hard time with word recall.  She has a history of anxiety and has been on sertraline in the past which was discontinued and replaced with escitalopram.  She tells me she takes her escitalopram sporadically because its been causing bad nightmares and yawning.  She denies any suicidal ideation, but does feel she is anxious.  She reports she has a family history of vitamin B12 deficiency in her father.  She also personally have history of vitamin D deficiency and continues on vitamin D3 supplementation.  She denies any personal or family history of ADD or ADHD. ? ?Left hip pain: She has left hip pain that will sometimes radiate down her left leg.  She denies falls, numbness, or new bowel/bladder incontinence.  She is concerned she may have MS.  She does report tingling intermittently in that left leg. ? ? ?Past Medical History:  ?Diagnosis Date  ? Burning with urination 01/31/2014  ? BV (bacterial vaginosis) 01/31/2014  ? Contraceptive management 11/29/2013  ? Exposure to chlamydia 03/13/2015  ? GBS bacteriuria 05/11/2017  ? tx 05/11/17   POC: neg   Tx in labor  ? History of elective abortion 11/29/2013  ? History of postpartum hypertension 04/10/2017  ? Requiring meds       Baby ASA started @ 19wks (entry to care)  ? Induced abortion 08/21/15  ? Miscarriage 12/22/2015  ? SAB July 2017  ? Screening for STD (sexually transmitted  disease) 09/11/2015  ? Supervision of normal pregnancy 04/10/2017  ?  [facility]  Initiated Care at  19wk  FOB Darius Jones  Dating By LMP c/w 2nd trimester U/S 15wk  Pap 04/10/17 neg  GC/CT Initial:  -/-              36+wks:-/-  Genetic Screen declined  CF screen Declined   Anatomic [place] Normal female  Flu vaccine Declined 04/10/17   Tdap Recommended ~ 28wks  Glucose Screen  2 hr80/111/87  GBS Pos (urine)  Feed Preference bottle  Contraception BTL, consent:   ? SVD (spontaneous vaginal delivery) 09/03/2017  ? UTI (lower urinary tract infection)   ? Vaginal discharge 11/29/2013  ? Vitamin D deficiency   ? ?  ? ?Review of Systems  ?Constitutional:  Positive for malaise/fatigue.  ?Respiratory:  Negative for shortness of breath.   ?Cardiovascular:  Negative for chest pain.  ?Gastrointestinal:   ?     (-) fecal incontinence  ?Genitourinary:   ?     (-) urinary incontinence  ?Neurological:  Positive for tingling. Negative for sensory change.  ?Psychiatric/Behavioral:  Positive for depression. Negative for suicidal ideas. The patient is nervous/anxious.   ? ?  ? ?  09/23/2021  ?  3:10 PM 05/27/2020  ?  4:33 PM 04/10/2017  ?  2:40 PM  ?PHQ9 SCORE  ONLY  ?PHQ-9 Total Score 2 7 2   ? ? ?  09/23/2021  ?  3:18 PM  ?6CIT Screen  ?What Year? 0 points  ?What month? 0 points  ?What time? 0 points  ?Count back from 20 0 points  ?Months in reverse 0 points  ?Repeat phrase 0 points  ?Total Score 0 points  ? ? ? ?  09/23/2021  ?  3:22 PM  ?GAD 7 : Generalized Anxiety Score  ?Nervous, Anxious, on Edge 2  ?Control/stop worrying 3  ?Worry too much - different things 3  ?Trouble relaxing 0  ?Restless 0  ?Easily annoyed or irritable 2  ?Afraid - awful might happen 2  ?Total GAD 7 Score 12  ? ? ? ? ?Objective:  ?  ? ?BP 124/72 (BP Location: Right Arm, Patient Position: Sitting, Cuff Size: Large)   Pulse 87   Temp 98.7 ?F (37.1 ?C) (Oral)   Ht 5\' 8"  (1.727 m)   Wt 242 lb (109.8 kg)   SpO2 96%   BMI 36.80 kg/m?  ?BP Readings from Last 3  Encounters:  ?09/23/21 124/72  ?04/22/21 129/81  ?01/20/21 131/78  ? ?Wt Readings from Last 3 Encounters:  ?09/23/21 242 lb (109.8 kg)  ?10/13/20 231 lb 12.8 oz (105.1 kg)  ?05/27/20 225 lb 6.4 oz (102.2 kg)  ? ?  ? ?Physical Exam ?Vitals reviewed.  ?Constitutional:   ?   General: She is not in acute distress. ?   Appearance: Normal appearance.  ?HENT:  ?   Head: Normocephalic and atraumatic.  ?Neck:  ?   Vascular: No carotid bruit.  ?Cardiovascular:  ?   Rate and Rhythm: Normal rate and regular rhythm.  ?   Pulses: Normal pulses.  ?   Heart sounds: Normal heart sounds.  ?Pulmonary:  ?   Effort: Pulmonary effort is normal.  ?   Breath sounds: Normal breath sounds.  ?Musculoskeletal:  ?   Cervical back: Normal.  ?   Thoracic back: Normal.  ?   Lumbar back: Normal.  ?Skin: ?   General: Skin is warm and dry.  ?Neurological:  ?   General: No focal deficit present.  ?   Mental Status: She is alert and oriented to person, place, and time.  ?   Cranial Nerves: Cranial nerves 2-12 are intact.  ?   Sensory: Sensation is intact.  ?   Motor: Motor function is intact.  ?   Coordination: Coordination is intact.  ?   Gait: Gait is intact.  ?Psychiatric:     ?   Mood and Affect: Mood normal.     ?   Behavior: Behavior normal.     ?   Judgment: Judgment normal.  ? ? ? ?No results found for any visits on 09/23/21. ? ? ? ?The ASCVD Risk score (Arnett DK, et al., 2019) failed to calculate for the following reasons: ?  The 2019 ASCVD risk score is only valid for ages 40 to 37 ? ?  ?Assessment & Plan:  ? ?Problem List Items Addressed This Visit   ? ?  ? Other  ? Vitamin D deficiency  ?  Chronic, patient to continue on her vitamin D3 supplement.  We will check serum vitamin D level today, further recommendations may be made based upon these results. ? ?  ?  ? Relevant Orders  ? Vitamin D (25 hydroxy)  ? Anxiety - Primary  ?  Chronic, I think the majority of her concerns are related to  anxiety.  Neurologic exam completely intact, 6CIT  screening results normal, GAD 7 elevated (12).  We will also collect blood work for further evaluation, further recommendations may be made based upon these results.  Because patient is quite concerned about possible neurologic abnormality such as MS will refer to neurology for assistance with evaluation, she was told that likelihood of this diagnosis is low.  Per shared decision making we will discontinue Lexapro and patient will start 50 mg of desvenlafaxine by mouth daily.  Patient was warned of possible risk of suicidal ideation and if this were to occur to call our office immediately.  Patient will follow-up in about 1 month for annual physical exam at which point we will also need to discuss how she is tolerating the desvenlafaxine. ? ?  ?  ? Relevant Medications  ? desvenlafaxine (PRISTIQ) 50 MG 24 hr tablet  ? Other Relevant Orders  ? TSH  ? Hemoglobin A1c  ? Lipid panel  ? Comprehensive metabolic panel  ? CBC  ? Ambulatory referral to Neurology  ? T3, free  ? T4, free  ? B12  ? Class 2 obesity without serious comorbidity with body mass index (BMI) of 36.0 to 36.9 in adult  ?  Further blood work ordered today, further recommendations may be made based upon these results. ? ?  ?  ? Relevant Orders  ? TSH  ? Hemoglobin A1c  ? Lipid panel  ? Comprehensive metabolic panel  ? CBC  ? Ambulatory referral to Neurology  ? T3, free  ? T4, free  ? B12  ? ?Other Visit Diagnoses   ? ? Memory change      ? Relevant Orders  ? TSH  ? Hemoglobin A1c  ? Lipid panel  ? Comprehensive metabolic panel  ? CBC  ? Ambulatory referral to Neurology  ? T3, free  ? T4, free  ? B12  ? Fatigue, unspecified type      ? Relevant Orders  ? TSH  ? Hemoglobin A1c  ? Lipid panel  ? Comprehensive metabolic panel  ? CBC  ? Ambulatory referral to Neurology  ? T3, free  ? T4, free  ? B12  ? Encounter for screening for cardiovascular disorders      ? Relevant Orders  ? TSH  ? Hemoglobin A1c  ? Lipid panel  ? Comprehensive metabolic panel  ? CBC  ?  Ambulatory referral to Neurology  ? T3, free  ? T4, free  ? B12  ? ?  ? ? ?Return in about 1 month (around 10/24/2021) for Annual Exam with Maralyn Sago.  ? ? ?[name], NP ? ?

## 2021-09-23 NOTE — Patient Instructions (Signed)
Desvenlafaxine Extended-Release Tablets ?What is this medication? ?DESVENLAFAXINE (des VEN la [employer]) treats depression. It increases the amount of serotonin and norepinephrine in the brain, substances that help regulate mood. It belongs to a group of medications called SNRIs. ?This medicine may be used for other purposes; ask your health care provider or pharmacist if you have questions. ?COMMON BRAND NAME(S): Bland Span, Pristiq ?What should I tell my care team before I take this medication? ?They need to know if you have any of these conditions: ?Glaucoma ?High blood pressure ?Kidney disease ?Liver disease ?Mania or bipolar disorder ?Suicidal thoughts or a previous suicide attempt ?An unusual reaction to desvenlafaxine, venlafaxine, other medications, foods, dyes, or preservatives ?Pregnant or trying to get pregnant ?Breast-feeding ?How should I use this medication? ?Take this medication by mouth with a drink of water. Do not crush, cut or chew. Follow the directions on the prescription label. Take your doses at regular intervals. Do not take your medication more often than directed. Do not stop taking this medication suddenly except upon the advice of your care team. Stopping this medication too quickly may cause serious side effects or your condition may worsen. ?Contact your care team about the use of this medication in children. Special care may be needed. ?A special MedGuide will be given to you by the pharmacist with each prescription and refill. Be sure to read this information carefully each time. ?Overdosage: If you think you have taken too much of this medicine contact a poison control center or emergency room at once. ?NOTE: This medicine is only for you. Do not share this medicine with others. ?What if I miss a dose? ?If you miss a dose, take it as soon as you can. If it is almost time for your next dose, take only that dose. Do not take double or extra doses. ?What may interact with this  medication? ?Do not take this medication with any of the following: ?Duloxetine ?Levomilnacipran ?Linezolid ?MAOIs like Carbex, Eldepryl, Marplan, Nardil, and Parnate ?Methylene blue (injected into a vein) ?Milnacipran ?Venlafaxine ?This medication may also interact with the following: ?Alcohol ?Amphetamines ?Aspirin and aspirin-like medications ?Certain migraine headache medications (almotriptan, eletriptan, frovatriptan, naratriptan, rizatriptan, sumatriptan, zolmitriptan) ?Dexfenfluramine or fenfluramine ?Furazolidone ?Isoniazid ?Lithium ?Medications for heart rhythm or blood pressure ?Medications that treat or prevent blood clots like warfarin, enoxaparin, and dalteparin ?Methylphenidate ?Metoclopramide ?NSAIDS, medications for pain and inflammation, like ibuprofen or naproxen ?Pentazocine ?Phentermine ?Procarbazine ?Protriptyline ?Rasagiline ?Sibutramine ?St. John's Wort, Hypericum perforatum ?Tramadol ?Tryptophan ?Zolpidem ?This list may not describe all possible interactions. Give your health care provider a list of all the medicines, herbs, non-prescription drugs, or dietary supplements you use. Also tell them if you smoke, drink alcohol, or use illegal drugs. Some items may interact with your medicine. ?What should I watch for while using this medication? ?Tell your care team if your symptoms do not get better or if they get worse. Visit your care team for regular checks on your progress. Because it may take several weeks to see the full effects of this medication, it is important to continue your treatment as prescribed by your care team. ?Patients and their families should watch out for new or worsening thoughts of suicide or depression. Also watch out for sudden changes in feelings such as feeling anxious, agitated, panicky, irritable, hostile, aggressive, impulsive, severely restless, overly excited and hyperactive, or not being able to sleep. If this happens, especially at the beginning of treatment or  after a change in dose, call your care  team. ?This medication can cause an increase in blood pressure. Check with your care team for instructions on monitoring your blood pressure while taking this medication. ?You may get drowsy or dizzy. Do not drive, use machinery, or do anything that needs mental alertness until you know how this medication affects you. Do not stand or sit up quickly, especially if you are an older patient. This reduces the risk of dizzy or fainting spells. Alcohol may interfere with the effect of this medication. Avoid alcoholic drinks. ?Your mouth may get dry. Chewing sugarless gum, sucking hard candy and drinking plenty of water will help. Contact your care team if the problem does not go away or is severe. ?What side effects may I notice from receiving this medication? ?Side effects that you should report to your care team as soon as possible: ?Allergic reactions--skin rash, itching, hives, swelling of the face, lips, tongue, or throat ?Dry cough, shortness of breath or trouble breathing ?Increase in blood pressure ?Irritability, confusion, fast or irregular heartbeat, muscle stiffness, twitching muscles, sweating, high fever, seizure, chills, vomiting, diarrhea, which may be signs of serotonin syndrome ?Low sodium level--muscle weakness, fatigue, dizziness, headache, confusion ?Seizures ?Sudden eye pain or change in vision such as blurry vision, seeing halos around lights, vision loss ?Thoughts of suicide or self-harm, worsening mood, feelings of depression ?Side effects that usually do not require medical attention (report to your care team if they continue or are bothersome): ?Change in sex drive or performance ?Constipation ?Dizziness ?Excessive sweating ?Loss of appetite ?Nausea ?Trouble sleeping ?This list may not describe all possible side effects. Call your doctor for medical advice about side effects. You may report side effects to FDA at 1-800-FDA-1088. ?Where should I keep my  medication? ?Keep out of the reach of children. ?Store at room temperature between 15 and 30 degrees C (59 and 86 degrees F). Throw away any unused medication after the expiration date. ?NOTE: This sheet is a summary. It may not cover all possible information. If you have questions about this medicine, talk to your doctor, pharmacist, or health care provider. ?? 2023 Elsevier/Gold Standard (2021-01-27 00:00:00) ? ?

## 2021-11-01 ENCOUNTER — Ambulatory Visit (INDEPENDENT_AMBULATORY_CARE_PROVIDER_SITE_OTHER): Payer: PRIVATE HEALTH INSURANCE | Admitting: Psychiatry

## 2021-11-01 ENCOUNTER — Encounter: Payer: Self-pay | Admitting: Psychiatry

## 2021-11-01 VITALS — BP 142/80 | HR 79 | Ht 68.0 in | Wt 240.0 lb

## 2021-11-01 DIAGNOSIS — R0683 Snoring: Secondary | ICD-10-CM

## 2021-11-01 DIAGNOSIS — R413 Other amnesia: Secondary | ICD-10-CM

## 2021-11-01 NOTE — Patient Instructions (Signed)
Mri brain Referral to Sleep team for possible sleep study

## 2021-11-01 NOTE — Progress Notes (Signed)
[facility]  PATIENT: April Tyler DOB: 11/07/1990  REFERRING CLINICIAN: Elenore PaddyGray, Sarah E, NP HISTORY FROM: self REASON FOR VISIT: memory loss   HISTORICAL  CHIEF COMPLAINT:  Chief Complaint  Patient presents with   New Patient (Initial Visit)    RM 1 here for consult on worsening memory. Pt reports over the last few months she has noticed trouble with recall, focusing, and processing different tasks.     HISTORY OF PRESENT ILLNESS:  The patient presents for evaluation of memory loss which has been gradually worsening over the past 1-2 years. No inciting events to onset of memory loss. Feels like she has a difficult time processing information.  Has to focus hard to understand what people are saying to her and will forget what someone has said to her one hour prior.  Has some word finding difficulty and sometimes will say the wrong words or they will come out garbled.  She has also reported intermittent paresthesias in her left leg.  Blood work on 09/23/21 including CBC, CMP, TSH, and B12 were normal.  TBI:  No past history of TBI Stroke:  no past history of stroke Seizures:  no past history of seizures Sleep: Snores loudly at night and has occasionally woken up gasping for breath. States she is "uncontrollably tired" during the day. Has never had a sleep study Mood: she feels irritable and anxious "all of the time". Took Zoloft previously but stopped it as she didn't like how it made her feel. Was recently started on Pristiq  Functional status:  Cooking: no issues Cleaning: no issues Shopping: no issues Driving: no issues Bills: no issues  Medications: no issues Forgetting loved ones names?: no Word finding difficulty? yes  OTHER MEDICAL CONDITIONS: anxiety, daytime sleepiness   REVIEW OF SYSTEMS: Full 14 system review of systems performed and negative with exception of: memory loss  ALLERGIES: No Known Allergies  HOME MEDICATIONS: Outpatient  Medications Prior to Visit  Medication Sig Dispense Refill   acetaminophen (TYLENOL) 500 MG tablet Take 500 mg by mouth every 6 (six) hours as needed for mild pain or headache.     Cholecalciferol (VITAMIN D3) 25 MCG (1000 UT) CAPS Take 2 capsules by mouth daily.     desvenlafaxine (PRISTIQ) 50 MG 24 hr tablet Take 1 tablet (50 mg total) by mouth daily. 90 tablet 1   metroNIDAZOLE (FLAGYL) 500 MG tablet Take 1 tablet (500 mg total) by mouth 2 (two) times daily. 14 tablet 0   ondansetron (ZOFRAN) 4 MG tablet Take 1 tablet (4 mg total) by mouth every 6 (six) hours. 12 tablet 0   valACYclovir (VALTREX) 500 MG tablet Take 1 tablet (500 mg total) by mouth 2 (two) times daily. 60 tablet 1   No facility-administered medications prior to visit.    PAST MEDICAL HISTORY: Past Medical History:  Diagnosis Date   Burning with urination 01/31/2014   BV (bacterial vaginosis) 01/31/2014   Contraceptive management 11/29/2013   Exposure to chlamydia 03/13/2015   GBS bacteriuria 05/11/2017   tx 05/11/17   POC: neg   Tx in labor   History of elective abortion 11/29/2013   History of postpartum hypertension 04/10/2017   Requiring meds       Baby ASA started @ 19wks (entry to care)   Induced abortion 08/21/15   Miscarriage 12/22/2015   SAB July 2017   Screening for STD (sexually transmitted disease) 09/11/2015   Supervision of normal pregnancy 04/10/2017    [facility]  Initiated Care at  19wk  FOB Darius Jones  Dating By LMP c/w 2nd trimester U/S 15wk  Pap 04/10/17 neg  GC/CT Initial:  -/-              36+wks:-/-  Genetic Screen declined  CF screen Declined   Anatomic [place] Normal female  Flu vaccine Declined 04/10/17   Tdap Recommended ~ 28wks  Glucose Screen  2 hr80/111/87  GBS Pos (urine)  Feed Preference bottle  Contraception BTL, consent:    SVD (spontaneous vaginal delivery) 09/03/2017   UTI (lower urinary tract infection)    Vaginal discharge 11/29/2013   Vitamin D deficiency     PAST SURGICAL  HISTORY: Past Surgical History:  Procedure Laterality Date   elective abortion     elective abortion  08/21/15    FAMILY HISTORY: Family History  Problem Relation Age of Onset   Other Father        B 12 Deficiency   Asthma Daughter    Hypertension Maternal Grandmother    Obesity Paternal Grandmother    Other Sister        B 12 Defficiency   Hypertension Maternal Aunt     SOCIAL HISTORY: Social History   Socioeconomic History   Marital status: Single    Spouse name: Not on file   Number of children: Not on file   Years of education: Not on file   Highest education level: Not on file  Occupational History   Not on file  Tobacco Use   Smoking status: Never   Smokeless tobacco: Never  Vaping Use   Vaping Use: Never used  Substance and Sexual Activity   Alcohol use: No    Comment: occ; not now   Drug use: No   Sexual activity: Not Currently    Birth control/protection: None  Other Topics Concern   Not on file  Social History Narrative   Right handed    Social Determinants of Health   Financial Resource Strain: Not on file  Food Insecurity: Not on file  Transportation Needs: Not on file  Physical Activity: Not on file  Stress: Not on file  Social Connections: Not on file  Intimate Partner Violence: Not on file     PHYSICAL EXAM   GENERAL EXAM/CONSTITUTIONAL: Vitals:  Vitals:   11/01/21 1352  BP: (!) 142/80  Pulse: 79  Weight: 240 lb (108.9 kg)  Height: 5\' 8"  (1.727 m)   Body mass index is 36.49 kg/m. Wt Readings from Last 3 Encounters:  11/01/21 240 lb (108.9 kg)  09/23/21 242 lb (109.8 kg)  10/13/20 231 lb 12.8 oz (105.1 kg)   NEUROLOGIC: MENTAL STATUS:     11/01/2021    2:03 PM  Montreal Cognitive Assessment   Visuospatial/ Executive (0/5) 5  Naming (0/3) 3  Attention: Read list of digits (0/2) 2  Attention: Read list of letters (0/1) 1  Attention: Serial 7 subtraction starting at 100 (0/3) 3  Language: Repeat phrase (0/2) 1   Language : Fluency (0/1) 1  Abstraction (0/2) 2  Delayed Recall (0/5) 4  Orientation (0/6) 6  Total 28  Adjusted Score (based on education) 28   CRANIAL NERVE:  2nd, 3rd, 4th, 6th - pupils equal and reactive to light, visual fields full to confrontation, extraocular muscles intact, no nystagmus 5th - facial sensation symmetric 7th - facial strength symmetric 8th - hearing intact 9th - palate elevates symmetrically, uvula midline 11th - shoulder shrug symmetric 12th - tongue protrusion  midline  MOTOR:  normal bulk and tone, full strength in the BUE, BLE  SENSORY:  normal and symmetric to light touch all 4 extremities  COORDINATION:  finger-nose-finger intact bilaterally  REFLEXES:  deep tendon reflexes present and symmetric  GAIT/STATION:  normal     DIAGNOSTIC DATA (LABS, IMAGING, TESTING) - I reviewed patient records, labs, notes, testing and imaging myself where available.  Lab Results  Component Value Date   WBC 5.4 09/23/2021   HGB 11.4 (L) 09/23/2021   HCT 35.5 (L) 09/23/2021   MCV 81.8 09/23/2021   PLT 294.0 09/23/2021      Component Value Date/Time   NA 138 09/23/2021 1548   K 3.7 09/23/2021 1548   CL 103 09/23/2021 1548   CO2 28 09/23/2021 1548   GLUCOSE 77 09/23/2021 1548   BUN 13 09/23/2021 1548   CREATININE 0.77 09/23/2021 1548   CREATININE 0.70 10/13/2020 1155   CALCIUM 9.4 09/23/2021 1548   PROT 7.6 09/23/2021 1548   ALBUMIN 4.4 09/23/2021 1548   AST 18 09/23/2021 1548   ALT 18 09/23/2021 1548   ALKPHOS 46 09/23/2021 1548   BILITOT 0.4 09/23/2021 1548   GFRNONAA 115 10/13/2020 1155   GFRAA 134 10/13/2020 1155   Lab Results  Component Value Date   CHOL 218 (H) 09/23/2021   HDL 107.30 09/23/2021   LDLCALC 97 09/23/2021   TRIG 65.0 09/23/2021   CHOLHDL 2 09/23/2021   Lab Results  Component Value Date   HGBA1C 5.7 09/23/2021   Lab Results  Component Value Date   VITAMINB12 315 09/23/2021   Lab Results  Component Value Date    TSH 1.09 09/23/2021     ASSESSMENT AND PLAN  31 y.o. year old female with a history of anxiety who presents for evaluation of worsening memory loss over the past 1-2 years. MOCA today is 28/30, within normal limits. Will order brain MRI to assess for structural causes of progressive memory loss. Will refer to Sleep team to evaluate for possible OSA as she reports excessive daytime sleepiness and snoring at night.   1. Memory loss   2. Snoring       PLAN: - MRI brain - Referral to Sleep team to evaluate for possible OSA  Orders Placed This Encounter  Procedures   MR BRAIN W WO CONTRAST   Ambulatory referral to Neurology    Return in about 6 months (around 05/03/2022).  I spent an average of 24 minutes chart reviewing and counseling the patient, with at least 50% of the time face to face with the patient.   Ocie Doyne, MD 11/01/21 2:30 PM  [facility] [address], [address] [place], [place] [address] [phone]

## 2021-11-04 ENCOUNTER — Encounter: Payer: Self-pay | Admitting: Nurse Practitioner

## 2021-11-08 ENCOUNTER — Telehealth: Payer: Self-pay | Admitting: Psychiatry

## 2021-11-08 NOTE — Telephone Encounter (Signed)
Referral for Neurology sent to [facility] [phone]. 

## 2021-12-13 ENCOUNTER — Ambulatory Visit: Admission: EM | Admit: 2021-12-13 | Discharge: 2021-12-13 | Discharge status: Absent without leave | Payer: Self-pay

## 2021-12-14 ENCOUNTER — Ambulatory Visit
Admission: EM | Admit: 2021-12-14 | Discharge: 2021-12-14 | Disposition: A | Discharge status: Home / Self Care | Payer: Self-pay | Attending: Family Medicine | Admitting: Family Medicine

## 2021-12-14 ENCOUNTER — Ambulatory Visit: Payer: Self-pay

## 2021-12-14 ENCOUNTER — Encounter: Payer: Self-pay | Admitting: Emergency Medicine

## 2021-12-14 DIAGNOSIS — R1032 Left lower quadrant pain: Secondary | ICD-10-CM | POA: Insufficient documentation

## 2021-12-14 DIAGNOSIS — R319 Hematuria, unspecified: Secondary | ICD-10-CM | POA: Insufficient documentation

## 2021-12-14 DIAGNOSIS — M545 Low back pain, unspecified: Secondary | ICD-10-CM | POA: Insufficient documentation

## 2021-12-14 LAB — POCT URINALYSIS DIP (MANUAL ENTRY)
Bilirubin, UA: NEGATIVE
Glucose, UA: NEGATIVE mg/dL
Ketones, POC UA: NEGATIVE mg/dL
Leukocytes, UA: NEGATIVE
Nitrite, UA: NEGATIVE
Protein Ur, POC: NEGATIVE mg/dL
Spec Grav, UA: 1.025 (ref 1.010–1.025)
Urobilinogen, UA: 0.2 E.U./dL
pH, UA: 6 (ref 5.0–8.0)

## 2021-12-14 MED ORDER — TAMSULOSIN HCL 0.4 MG PO CAPS: 0.4000 mg | ORAL_CAPSULE | Freq: Every day | ORAL | 0 refills | Status: DC

## 2021-12-14 NOTE — ED Provider Notes (Signed)
[facility]    CSN: [id number] Arrival date & time: 12/14/21  7893      History   Chief Complaint Chief Complaint  Patient presents with   Urinary Frequency    HPI April Tyler is a 31 y.o. female.   Patient presents today with urinary complaints.  She denies dysuria, however does endorse urinary frequency, urgency for the past week.  She has also developed new left low back pain that radiates to her left lower abdomen.  She denies fevers, nausea/vomiting.  She is eating and drinking normally.  Denies any hematuria, urine color change, urine odor change, vaginal discharge.  She is not having any abdominal pain currently.  She is also requesting routine STD testing today; denies known exposures, rashes/lesions on her genitalia.  She also declines HIV and syphilis testing today.    Past Medical History:  Diagnosis Date   Burning with urination 01/31/2014   BV (bacterial vaginosis) 01/31/2014   Contraceptive management 11/29/2013   Exposure to chlamydia 03/13/2015   GBS bacteriuria 05/11/2017   tx 05/11/17   POC: neg   Tx in labor   History of elective abortion 11/29/2013   History of postpartum hypertension 04/10/2017   Requiring meds       Baby ASA started @ 19wks (entry to care)   Induced abortion 08/21/15   Miscarriage 12/22/2015   SAB July 2017   Screening for STD (sexually transmitted disease) 09/11/2015   Supervision of normal pregnancy 04/10/2017    Cl[facility]  Initiated Care at  19wk  FOB Darius Jones  Dating By LMP c/w 2nd trimester U/S 15wk  Pap 04/10/17 neg  GC/CT Initial:  -/-              36+wks:-/-  Genetic Screen declined  CF screen Declined   Anatomic [place] Normal female  Flu vaccine Declined 04/10/17   Tdap Recommended ~ 28wks  Glucose Screen  2 hr80/111/87  GBS Pos (urine)  Feed Preference bottle  Contraception BTL, consent:    SVD (spontaneous vaginal delivery) 09/03/2017   UTI (lower urinary tract infection)    Vaginal discharge 11/29/2013    Vitamin D deficiency     Patient Active Problem List   Diagnosis Date Noted   Class 2 obesity without serious comorbidity with body mass index (BMI) of 36.0 to 36.9 in adult 09/23/2021   Hyperlipidemia 10/13/2020   Anxiety 10/13/2020   Vitamin D deficiency 05/28/2020   Screening examination for STD (sexually transmitted disease) 06/12/2019   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 Authorizing Provider  tamsulosin (FLOMAX) 0.4 MG CAPS capsule Take 1 capsule (0.4 mg total) by mouth at bedtime. 12/14/21  Yes Valentino Nose, NP  acetaminophen (TYLENOL) 500 MG tablet Take 500 mg by mouth every 6 (  six) hours as needed for mild pain or headache.    [provider]  Cholecalciferol (VITAMIN D3) 25 MCG (1000 UT) CAPS Take 2 capsules by mouth daily.    [provider]  desvenlafaxine (PRISTIQ) 50 MG 24 hr tablet Take 1 tablet (50 mg total) by mouth daily. 09/23/21   [name], NP  ondansetron (ZOFRAN) 4 MG tablet Take 1 tablet (4 mg total) by mouth every 6 (six) hours. 08/13/20   Wurst, [place], PA-C  valACYclovir (VALTREX) 500 MG tablet Take 1 tablet (500 mg total) by mouth 2 (two) times daily. 02/08/21   Adline Potter, NP  omeprazole (PRILOSEC) 20 MG capsule Take 1 capsule (20 mg total) by mouth daily. 05/27/20 08/13/20  [name], NP  sertraline (ZOLOFT) 25 MG tablet Take 1 tablet (25 mg total) by mouth daily. 05/27/20 08/13/20  [name], NP    Family History Family History  Problem Relation Age of Onset   Other Father        B 58 Deficiency    Asthma Daughter    Hypertension Maternal Grandmother    Obesity Paternal Grandmother    Other Sister        B 12 Defficiency   Hypertension Maternal Aunt     Social History Social History   Tobacco Use   Smoking status: Never   Smokeless tobacco: Never  Vaping Use   Vaping Use: Never used  Substance Use Topics   Alcohol use: No    Comment: occ; not now   Drug use: No     Allergies   Patient has no known allergies.   Review of Systems Review of Systems Per HPI  Physical Exam Triage Vital Signs ED Triage Vitals  Enc Vitals Group     BP 12/14/21 0920 (!) 154/80     Pulse Rate 12/14/21 0920 72     Resp 12/14/21 0920 18     Temp 12/14/21 0920 99.2 F (37.3 C)     Temp Source 12/14/21 0920 Oral     SpO2 12/14/21 0920 98 %     Weight --      Height --      Head Circumference --      Peak Flow --      Pain Score 12/14/21 0921 0     Pain Loc --      Pain Edu? --      Excl. in GC? --    No data found.  Updated Vital Signs BP (!) 154/80 (BP Location: Right Arm)   Pulse 72   Temp 99.2 F (37.3 C) (Oral)   Resp 18   LMP 11/30/2021 (Approximate)   SpO2 98%   Visual Acuity Right Eye Distance:   Left Eye Distance:   Bilateral Distance:    Right Eye Near:   Left Eye Near:    Bilateral Near:     Physical Exam Vitals and nursing note reviewed.  Constitutional:      General: She is not in acute distress.    Appearance: Normal appearance. She is not toxic-appearing.  HENT:     Head: Normocephalic and atraumatic.     Mouth/Throat:     Mouth: Mucous membranes are moist.     Pharynx: Oropharynx is clear.  Cardiovascular:     Rate and Rhythm: Normal rate and regular rhythm.  Pulmonary:     Effort: Pulmonary effort is normal. No respiratory distress.     Breath sounds: Normal breath sounds.  No wheezing, rhonchi or rales.  Abdominal:     General: Abdomen is flat. Bowel sounds are normal. There is no distension.     Palpations: Abdomen is soft.      Tenderness: There is no abdominal tenderness. There is no right CVA tenderness, left CVA tenderness or guarding.  Genitourinary:    Comments: Deferred-self swab obtained Skin:    General: Skin is warm and dry.     Capillary Refill: Capillary refill takes less than 2 seconds.     Coloration: Skin is not jaundiced or pale.     Findings: No erythema.  Neurological:     Mental Status: She is alert and oriented to person, place, and time.  Psychiatric:        Behavior: Behavior is cooperative.      UC Treatments / Results  Labs (all labs ordered are listed, but only abnormal results are displayed) Labs Reviewed  POCT URINALYSIS DIP (MANUAL ENTRY) - Abnormal; Notable for the following components:      Result Value   Blood, UA trace-intact (*)    All other components within normal limits  URINE CULTURE  CERVICOVAGINAL ANCILLARY ONLY    EKG   Radiology No results found.  Procedures Procedures (including critical care time)  Medications Ordered in UC Medications - No data to display  Initial Impression / Assessment and Plan / UC Course  I have reviewed the triage vital signs and the nursing notes.  Pertinent labs & imaging results that were available during my care of the patient were reviewed by me and considered in my medical decision making (see chart for details).    Patient is a very pleasant, well-appearing 31 year old female presenting with urinary frequency and urgency.  Urinalysis today is clean with exception of trace intact blood.  Urine culture pending.  Suspect kidney stone; she does not have CVA tenderness today or signs of pyelonephritis.  Start Flomax at nighttime, pushing hydration with plenty of water.  Strain urine, use Tylenol/ibuprofen as needed for pain.  Seek care in the emergency room if severe pain, nausea/vomiting and unable to keep fluids down, fevers develop.    We will send self swab to check for STI, yeast infection, bacterial vaginosis and treat  as indicated.  Encouraged condom use. Final Clinical Impressions(s) / UC Diagnoses   Final diagnoses:  Hematuria, unspecified type  Acute left-sided low back pain without sciatica  LLQ abdominal pain     Discharge Instructions      - The urine sample today does not show signs of infection; there is a little bit of blood which may mean you have a kidney stone especially with the pain you have been having - Please start the Flomax nightly and push hydration with plenty of water - We will let you know if the urine culture shows signs of infection and if the vaginal swab shows an abnormality  - Please use condoms with every sexual encounter to prevent STI    ED Prescriptions     Medication Sig Dispense Auth. Provider   tamsulosin (FLOMAX) 0.4 MG CAPS capsule Take 1 capsule (0.4 mg total) by mouth at bedtime. 30 capsule Valentino Nose, NP      PDMP not reviewed this encounter.   Valentino Nose, NP 12/14/21 1108

## 2021-12-14 NOTE — Discharge Instructions (Signed)
-   The urine sample today does not show signs of infection; there is a little bit of blood which may mean you have a kidney stone especially with the pain you have been having - Please start the Flomax nightly and push hydration with plenty of water - We will let you know if the urine culture shows signs of infection and if the vaginal swab shows an abnormality  - Please use condoms with every sexual encounter to prevent STI

## 2021-12-14 NOTE — ED Triage Notes (Addendum)
Pt reports urinary frequency and retention x1 week. Pt reports LLQ back pain radiates to LLQ abdomen.   Pt would also like to be swabbed for STD while here.  No known exposure.

## 2021-12-15 ENCOUNTER — Telehealth (HOSPITAL_COMMUNITY): Payer: Self-pay | Admitting: Emergency Medicine

## 2021-12-15 LAB — CERVICOVAGINAL ANCILLARY ONLY
Bacterial Vaginitis (gardnerella): POSITIVE — AB
Candida Glabrata: NEGATIVE
Candida Vaginitis: NEGATIVE
Chlamydia: NEGATIVE
Comment: NEGATIVE
Comment: NEGATIVE
Comment: NEGATIVE
Comment: NEGATIVE
Comment: NEGATIVE
Comment: NORMAL
Neisseria Gonorrhea: NEGATIVE
Trichomonas: NEGATIVE

## 2021-12-15 LAB — URINE CULTURE: Culture: NO GROWTH

## 2021-12-15 MED ORDER — METRONIDAZOLE 500 MG PO TABS: 500.0000 mg | ORAL_TABLET | Freq: Two times a day (BID) | ORAL | 0 refills | Status: DC

## 2022-04-08 ENCOUNTER — Other Ambulatory Visit: Payer: Self-pay | Admitting: Adult Health

## 2022-05-03 ENCOUNTER — Encounter: Payer: Self-pay | Admitting: Psychiatry

## 2022-05-03 ENCOUNTER — Ambulatory Visit: Payer: PRIVATE HEALTH INSURANCE | Admitting: Psychiatry

## 2022-05-03 NOTE — Progress Notes (Deleted)
   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 She also reports intermittent paresthesias in her left leg. MOCA within normal limits.  At her last visit, brain MRI was ordered. She was referred to Sleep team for OSA evaluation. Interval History: *** MRI brain was ordered but never done. She never scheduled an appointment with Sleep***  Physical Exam:   Vital Signs: There were no vitals taken for this visit. GENERAL:  well appearing, in no acute distress, alert  SKIN:  Color, texture, turgor normal. No rashes or lesions HEAD:  Normocephalic/atraumatic. RESP: normal respiratory effort MSK:  No gross joint deformities.   NEUROLOGICAL: Mental Status: Alert, oriented to person, place and time, Follows commands, and Speech fluent and appropriate. Cranial Nerves: PERRL, face symmetric, no dysarthria, hearing grossly intact Motor: moves all extremities equally Gait: normal-based.  IMPRESSION: ***  PLAN: ***   Follow-up: ***  I spent a total of *** minutes on the date of the service. Discussed medication side effects, adverse reactions and drug interactions. Written educational materials and patient instructions outlining all of the above were given.  Ocie Doyne, MD

## 2022-06-13 ENCOUNTER — Ambulatory Visit
Admission: EM | Admit: 2022-06-13 | Discharge: 2022-06-13 | Disposition: A | Discharge status: Home / Self Care | Payer: PRIVATE HEALTH INSURANCE | Attending: Internal Medicine | Admitting: Internal Medicine

## 2022-06-13 DIAGNOSIS — N760 Acute vaginitis: Secondary | ICD-10-CM

## 2022-06-13 DIAGNOSIS — B9689 Other specified bacterial agents as the cause of diseases classified elsewhere: Secondary | ICD-10-CM

## 2022-06-13 DIAGNOSIS — R300 Dysuria: Secondary | ICD-10-CM

## 2022-06-13 LAB — POCT URINALYSIS DIP (MANUAL ENTRY)
Bilirubin, UA: NEGATIVE
Glucose, UA: NEGATIVE mg/dL
Ketones, POC UA: NEGATIVE mg/dL
Leukocytes, UA: NEGATIVE
Nitrite, UA: NEGATIVE
Protein Ur, POC: NEGATIVE mg/dL
Spec Grav, UA: 1.025 (ref 1.010–1.025)
Urobilinogen, UA: 0.2 E.U./dL
pH, UA: 5.5 (ref 5.0–8.0)

## 2022-06-13 MED ORDER — METRONIDAZOLE 500 MG PO TABS: 500.0000 mg | ORAL_TABLET | Freq: Two times a day (BID) | ORAL | 0 refills | Status: AC

## 2022-06-13 MED ORDER — FLUCONAZOLE 150 MG PO TABS: 150.0000 mg | ORAL_TABLET | Freq: Once | ORAL | 0 refills | Status: AC

## 2022-06-13 NOTE — ED Triage Notes (Signed)
Pt reports frequent urination, slight burning, low back pain, low abdominal discomfort x 2 weeks.   Pt wants to get std testing.

## 2022-06-13 NOTE — ED Provider Notes (Signed)
[facility]    CSN: [id number] Arrival date & time: 06/13/22  1222      History   Chief Complaint Chief Complaint  Patient presents with   Urinary Tract Infection    HPI April Tyler is a 32 y.o. female.   Patient presents today for 2-week history of urinary frequency, voiding smaller amounts, new midline low back pain, and vaginal discharge.  Patient endorses minimal dysuria at the end of urination occasionally.  Denies odor to vaginal discharge, abnormal colored vaginal discharge.  No itching in her perineum.  Reports history of bacterial vaginosis.  She is not concerned today about STI and declines HIV and syphilis screening.  No sores/rashes, lymphadenopathy in her groin.  No new urinary incontinence, hematuria, abdominal pain, flank pain, or fever, nausea/vomiting since symptoms began.  Patient is confident she is not pregnant; LMP 1 week ago and ended yesterday.    Past Medical History:  Diagnosis Date   Burning with urination 01/31/2014   BV (bacterial vaginosis) 01/31/2014   Contraceptive management 11/29/2013   Exposure to chlamydia 03/13/2015   GBS bacteriuria 05/11/2017   tx 05/11/17   POC: neg   Tx in labor   History of elective abortion 11/29/2013   History of postpartum hypertension 04/10/2017   Requiring meds       Baby ASA started @ 19wks (entry to care)   Induced abortion 08/21/15   Miscarriage 12/22/2015   SAB July 2017   Screening for STD (sexually transmitted disease) 09/11/2015   Supervision of normal pregnancy 04/10/2017    [facility]  Initiated Care at  19wk  FOB Darius Jones  Dating By LMP c/w 2nd trimester U/S 15wk  Pap 04/10/17 neg  GC/CT Initial:  -/-              36+wks:-/-  Genetic Screen declined  CF screen Declined   Anatomic [place] Normal female  Flu vaccine Declined 04/10/17   Tdap Recommended ~ 28wks  Glucose Screen  2 hr80/111/87  GBS Pos (urine)  Feed Preference bottle  Contraception BTL, consent:    SVD (spontaneous vaginal  delivery) 09/03/2017   UTI (lower urinary tract infection)    Vaginal discharge 11/29/2013   Vitamin D deficiency     Patient Active Problem List   Diagnosis Date Noted   Class 2 obesity without serious comorbidity with body mass index (BMI) of 36.0 to 36.9 in adult 09/23/2021   Hyperlipidemia 10/13/2020   Anxiety 10/13/2020   Vitamin D deficiency 05/28/2020   Screening examination for STD (sexually transmitted disease) 06/12/2019   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 Authorizing Provider  fluconazole (DIFLUCAN) 150 MG tablet Take 1 tablet (150 mg total) by mouth once for 1 dose. 06/13/22 06/13/22 Yes Valentino Nose, NP  acetaminophen (TYLENOL) 500 MG tablet Take 500 mg by mouth every 6 (six) hours as needed for mild pain or headache.    [provider]  Cholecalciferol (VITAMIN D3) 25 MCG (1000 UT) CAPS Take 2 capsules by mouth daily.    [provider]  desvenlafaxine (PRISTIQ) 50 MG 24 hr tablet Take 1 tablet (50 mg total) by mouth daily. 09/23/21   Ailene Ards, NP  metroNIDAZOLE (FLAGYL) 500 MG tablet Take 1 tablet (500 mg total) by mouth 2 (two) times daily for 7 days. Do not drink alcohol while taking 06/13/22 06/20/22  Eulogio Bear, NP  valACYclovir (VALTREX) 500 MG tablet TAKE 1 TABLET(500 MG) BY MOUTH TWICE DAILY 04/08/22   Estill Dooms, NP  omeprazole (PRILOSEC) 20 MG capsule Take 1 capsule (20 mg total) by mouth daily. 05/27/20 08/13/20  Ailene Ards, NP  sertraline (ZOLOFT) 25 MG tablet Take 1 tablet (25 mg total) by mouth daily.  05/27/20 08/13/20  Ailene Ards, NP    Family History Family History  Problem Relation Age of Onset   Other Father        B 59 Deficiency   Asthma Daughter    Hypertension Maternal Grandmother    Obesity Paternal Grandmother    Other Sister        B 55 Defficiency   Hypertension Maternal Aunt     Social History Social History   Tobacco Use   Smoking status: Never   Smokeless tobacco: Never  Vaping Use   Vaping Use: Never used  Substance Use Topics   Alcohol use: No    Comment: occ; not now   Drug use: No     Allergies   Patient has no known allergies.   Review of Systems Review of Systems Per HPI  Physical Exam Triage Vital Signs ED Triage Vitals  Enc Vitals Group     BP 06/13/22 1232 120/78     Pulse Rate 06/13/22 1232 60     Resp 06/13/22 1232 20     Temp 06/13/22 1232 98.6 F (37 C)     Temp Source 06/13/22 1232 Oral     SpO2 06/13/22 1232 99 %     Weight --      Height --      Head Circumference --      Peak Flow --      Pain Score 06/13/22 1234 5     Pain Loc --      Pain Edu? --      Excl. in [place]? --    No data found.  Updated Vital Signs BP 120/78 (BP Location: Right Arm)   Pulse 60   Temp 98.6 F (37 C) (Oral)   Resp 20   LMP 06/08/2022 (Exact Date)   SpO2 99%   Visual Acuity Right Eye Distance:   Left Eye Distance:   Bilateral Distance:    Right Eye Near:   Left Eye Near:    Bilateral Near:     Physical Exam Vitals and nursing note reviewed.  Constitutional:      General: She is not in acute distress.    Appearance: She is not toxic-appearing.  Pulmonary:     Effort: Pulmonary effort is normal. No respiratory distress.  Abdominal:     General: Abdomen is flat. Bowel sounds are normal. There is no distension.     Palpations: Abdomen is soft. There is no mass.     Tenderness: There is no abdominal tenderness.  There is no right CVA tenderness, left CVA tenderness or guarding.  Genitourinary:    Comments: Deferred - self  swab performed Skin:    General: Skin is warm and dry.     Coloration: Skin is not jaundiced or pale.     Findings: No erythema.  Neurological:     Mental Status: She is alert and oriented to person, place, and time.     Motor: No weakness.     Gait: Gait normal.  Psychiatric:        Behavior: Behavior is cooperative.      UC Treatments / Results  Labs (all labs ordered are listed, but only abnormal results are displayed) Labs Reviewed  POCT URINALYSIS DIP (MANUAL ENTRY) - Abnormal; Notable for the following components:      Result Value   Blood, UA trace-intact (*)    All other components within normal limits  URINE CULTURE  CERVICOVAGINAL ANCILLARY ONLY    EKG   Radiology No results found.  Procedures Procedures (including critical care time)  Medications Ordered in UC Medications - No data to display  Initial Impression / Assessment and Plan / UC Course  I have reviewed the triage vital signs and the nursing notes.  Pertinent labs & imaging results that were available during my care of the patient were reviewed by me and considered in my medical decision making (see chart for details).   Patient is well-appearing, normotensive, afebrile, not tachycardic, not tachypneic, oxygenating well on room air.    1. Acute vaginitis 2. BV (bacterial vaginosis) I am suspicious for bacterial vaginosis Will treat empirically with metronidazole 500 mg twice daily for 7 days-instructed not to drink alcohol while taking Prescription given for Diflucan if she develops yeast infection symptoms at the end of treatment with antibiotic therapy  3. Dysuria Urinalysis today with trace amount of blood; suspect secondary to end of menses, however will culture for rule out UTI  The patient was given the opportunity to ask questions.  All questions answered to their satisfaction.  The patient is in agreement to this plan.    Final Clinical Impressions(s) / UC Diagnoses   Final  diagnoses:  Acute vaginitis  BV (bacterial vaginosis)  Dysuria     Discharge Instructions      As we discussed, it sounds like you have bacterial vaginosis.  The urine sample today only shows a very small amount of blood, which may be coming from your menses ending yesterday.  We are sending for urine culture and I have also sent the vaginal swab off for testing.  Will call you later this week with any abnormal results or if treatment needs to be changed in any way.  In the meantime, start the Flagyl tablets twice daily for 7 days to treat bacterial vaginosis.  If you develop signs of yeast infection after treatment, could not take the Diflucan at the end of treatment.    ED Prescriptions     Medication Sig Dispense Auth. Provider   metroNIDAZOLE (FLAGYL) 500 MG tablet Take 1 tablet (500 mg total) by mouth 2 (two) times daily for 7 days. Do not drink alcohol while taking 14 tablet Noemi Chapel A, NP   fluconazole (DIFLUCAN) 150 MG tablet Take 1 tablet (150 mg total) by mouth once for 1 dose. 1 tablet Eulogio Bear, NP      PDMP not reviewed this encounter.   Eulogio Bear, NP 06/13/22 1536

## 2022-06-13 NOTE — Discharge Instructions (Addendum)
As we discussed, it sounds like you have bacterial vaginosis.  The urine sample today only shows a very small amount of blood, which may be coming from your menses ending yesterday.  We are sending for urine culture and I have also sent the vaginal swab off for testing.  Will call you later this week with any abnormal results or if treatment needs to be changed in any way.  In the meantime, start the Flagyl tablets twice daily for 7 days to treat bacterial vaginosis.  If you develop signs of yeast infection after treatment, could not take the Diflucan at the end of treatment.

## 2022-06-14 LAB — CERVICOVAGINAL ANCILLARY ONLY
Bacterial Vaginitis (gardnerella): NEGATIVE
Candida Glabrata: NEGATIVE
Candida Vaginitis: NEGATIVE
Chlamydia: NEGATIVE
Comment: NEGATIVE
Comment: NEGATIVE
Comment: NEGATIVE
Comment: NEGATIVE
Comment: NEGATIVE
Comment: NORMAL
Neisseria Gonorrhea: NEGATIVE
Trichomonas: NEGATIVE

## 2022-06-15 LAB — URINE CULTURE: Culture: 30000 — AB

## 2022-09-27 ENCOUNTER — Encounter: Payer: Self-pay | Admitting: Emergency Medicine

## 2022-09-27 ENCOUNTER — Ambulatory Visit
Admission: EM | Admit: 2022-09-27 | Discharge: 2022-09-27 | Disposition: A | Discharge status: Home / Self Care | Payer: PRIVATE HEALTH INSURANCE | Attending: Nurse Practitioner | Admitting: Nurse Practitioner

## 2022-09-27 DIAGNOSIS — J0140 Acute pansinusitis, unspecified: Secondary | ICD-10-CM | POA: Diagnosis not present

## 2022-09-27 MED ORDER — FLUTICASONE PROPIONATE 50 MCG/ACT NA SUSP: 2.0000 | Freq: Every day | NASAL | 0 refills | Status: DC

## 2022-09-27 MED ORDER — PSEUDOEPH-BROMPHEN-DM 30-2-10 MG/5ML PO SYRP: 5.0000 mL | ORAL_SOLUTION | Freq: Four times a day (QID) | ORAL | 0 refills | Status: DC | PRN

## 2022-09-27 MED ORDER — AMOXICILLIN-POT CLAVULANATE 875-125 MG PO TABS: 1.0000 | ORAL_TABLET | Freq: Two times a day (BID) | ORAL | 0 refills | Status: DC

## 2022-09-27 NOTE — Discharge Instructions (Signed)
Take medication as directed. Consider starting an allergy medication daily such as Claritin, Zyrtec, or Allegra.  You can purchase this over-the-counter. Increase fluids and get plenty of rest. May take over-the-counter ibuprofen or Tylenol as needed for pain, fever, or general discomfort. Recommend normal saline nasal spray to help with nasal congestion throughout the day. For your cough, it may be helpful to use a humidifier at bedtime during sleep. If symptoms do not improve with this treatment, please follow-up in this clinic or with your primary care physician for further evaluation. Follow-up as needed.

## 2022-09-27 NOTE — ED Triage Notes (Signed)
Nasal drainage x 2 weeks. headache, states feels "winded", some discomfort that comes and goes under breast and around to back  since yesterday.

## 2022-09-27 NOTE — ED Provider Notes (Signed)
[facility]    CSN: [id number] Arrival date & time: 09/27/22  1357      History   Chief Complaint No chief complaint on file.   HPI April Tyler is a 32 y.o. female.   The history is provided by the patient.   The patient presents for complaints of nasal congestion, headache, cough, and sinus pressure that is been present for the past 2 weeks.  Patient states symptoms started as a cold.  She states "I am never gotten over it".  She also admits to feeling "winded" occasionally.  She states that she has noticed some slight discomfort under her breast that radiates around to her back.  She states her cough is a "dry cough" she also states that she has noticed postnasal drainage that exacerbates her cough.  Patient denies fever, chills, sore throat, ear pain, ear drainage, wheezing, shortness of breath, difficulty breathing, abdominal pain, nausea, vomiting, or diarrhea.  Patient denies history of asthma, she does endorse seasonal allergies.  She currently does not take any medication for her allergies.  Past Medical History:  Diagnosis Date   Burning with urination 01/31/2014   BV (bacterial vaginosis) 01/31/2014   Contraceptive management 11/29/2013   Exposure to chlamydia 03/13/2015   GBS bacteriuria 05/11/2017   tx 05/11/17   POC: neg   Tx in labor   History of elective abortion 11/29/2013   History of postpartum hypertension 04/10/2017   Requiring meds       Baby ASA started @ 19wks (entry to care)   Induced abortion 08/21/15   Miscarriage 12/22/2015   SAB July 2017   Screening for STD (sexually transmitted disease) 09/11/2015   Supervision of normal pregnancy 04/10/2017    [facility]  Initiated Care at  19wk  FOB Darius Jones  Dating By LMP c/w 2nd trimester U/S 15wk  Pap 04/10/17 neg  GC/CT Initial:  -/-              36+wks:-/-  Genetic Screen declined  CF screen Declined   Anatomic [place] Normal female  Flu vaccine Declined 04/10/17   Tdap Recommended ~ 28wks   Glucose Screen  2 hr80/111/87  GBS Pos (urine)  Feed Preference bottle  Contraception BTL, consent:    SVD (spontaneous vaginal delivery) 09/03/2017   UTI (lower urinary tract infection)    Vaginal discharge 11/29/2013   Vitamin D deficiency     Patient Active Problem List   Diagnosis Date Noted   Class 2 obesity without serious comorbidity with body mass index (BMI) of 36.0 to 36.9 in adult 09/23/2021   Hyperlipidemia 10/13/2020   Anxiety 10/13/2020   Vitamin D deficiency 05/28/2020   Screening examination for STD (sexually transmitted disease) 06/12/2019   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  Authorizing Provider  amoxicillin-clavulanate (AUGMENTIN) 875-125 MG tablet Take 1 tablet by mouth every 12 (twelve) hours. 09/27/22  Yes Dadrian Ballantine-Warren, Sadie Haber, NP  brompheniramine-pseudoephedrine-DM 30-2-10 MG/5ML syrup Take 5 mLs by mouth 4 (four) times daily as needed. 09/27/22  Yes Shanay Woolman-Warren, Sadie Haber, NP  fluticasone (FLONASE) 50 MCG/ACT nasal spray Place 2 sprays into both nostrils daily. 09/27/22  Yes Charron Coultas-Warren, Sadie Haber, NP  acetaminophen (TYLENOL) 500 MG tablet Take 500 mg by mouth every 6 (six) hours as needed for mild pain or headache.    [provider]  valACYclovir (VALTREX) 500 MG tablet TAKE 1 TABLET(500 MG) BY MOUTH TWICE DAILY 04/08/22   Cyril Mourning A, NP  omeprazole (PRILOSEC) 20 MG capsule Take 1 capsule (20 mg total) by mouth daily. 05/27/20 08/13/20  [name], NP  sertraline (ZOLOFT) 25 MG tablet Take 1 tablet (25 mg total) by mouth daily.  05/27/20 08/13/20  [name], NP    Family History Family History  Problem Relation Age of Onset   Other Father        B 51 Deficiency   Asthma Daughter    Hypertension Maternal Grandmother    Obesity Paternal Grandmother    Other Sister        B 12 Defficiency   Hypertension Maternal Aunt     Social History Social History   Tobacco Use   Smoking status: Never   Smokeless tobacco: Never  Vaping Use   Vaping Use: Never used  Substance Use Topics   Alcohol use: No    Comment: occ; not now   Drug use: No     Allergies   Patient has no known allergies.   Review of Systems Review of Systems Per HPI  Physical Exam Triage Vital Signs ED Triage Vitals  Enc Vitals Group     BP 09/27/22 1409 134/78     Pulse Rate 09/27/22 1409 82     Resp 09/27/22 1409 18     Temp 09/27/22 1409 99.6 F (37.6 C)     Temp Source 09/27/22 1409 Oral     SpO2 09/27/22 1409 98 %     Weight --      Height --      Head Circumference --      Peak Flow --      Pain Score 09/27/22 1411 2     Pain Loc --      Pain Edu? --      Excl. in GC? --    No data found.  Updated Vital Signs BP 134/78 (BP Location: Right Arm)   Pulse 82   Temp 99.6 F (37.6 C) (Oral)   Resp 18   LMP 09/27/2022 (Exact Date)   SpO2 98%   Visual Acuity Right Eye Distance:   Left Eye Distance:   Bilateral Distance:    Right Eye Near:   Left Eye Near:    Bilateral Near:     Physical Exam Vitals and nursing note reviewed.  Constitutional:      General: She is not in acute distress.    Appearance: Normal appearance.  HENT:     Head: Normocephalic.     Right Ear: Tympanic membrane, ear canal and external ear normal.     Left Ear: Tympanic membrane, ear canal and external ear normal.     Nose: Congestion present. No rhinorrhea.     Right Turbinates: Enlarged and swollen.     Left Turbinates: Enlarged and swollen.  Right Sinus: No maxillary sinus tenderness or frontal sinus tenderness.     Left  Sinus: Maxillary sinus tenderness and frontal sinus tenderness present.     Mouth/Throat:     Lips: Pink.     Mouth: Mucous membranes are moist.     Pharynx: Oropharynx is clear. Uvula midline. Posterior oropharyngeal erythema present. No pharyngeal swelling, oropharyngeal exudate or uvula swelling.     Comments: Cobblestoning present on posterior oropharynx Eyes:     Extraocular Movements: Extraocular movements intact.     Conjunctiva/sclera: Conjunctivae normal.     Pupils: Pupils are equal, round, and reactive to light.  Cardiovascular:     Rate and Rhythm: Normal rate and regular rhythm.     Pulses: Normal pulses.     Heart sounds: Normal heart sounds.  Pulmonary:     Effort: Pulmonary effort is normal. No respiratory distress.     Breath sounds: Normal breath sounds. No stridor. No wheezing, rhonchi or rales.  Abdominal:     General: Bowel sounds are normal.     Palpations: Abdomen is soft.  Musculoskeletal:     Cervical back: Normal range of motion.  Lymphadenopathy:     Cervical: No cervical adenopathy.  Skin:    General: Skin is warm and dry.  Neurological:     General: No focal deficit present.     Mental Status: She is alert and oriented to person, place, and time.  Psychiatric:        Mood and Affect: Mood normal.        Behavior: Behavior normal.      UC Treatments / Results  Labs (all labs ordered are listed, but only abnormal results are displayed) Labs Reviewed - No data to display  EKG   Radiology No results found.  Procedures Procedures (including critical care time)  Medications Ordered in UC Medications - No data to display  Initial Impression / Assessment and Plan / UC Course  I have reviewed the triage vital signs and the nursing notes.  Pertinent labs & imaging results that were available during my care of the patient were reviewed by me and considered in my medical decision making (see chart for details).  The patient is well-appearing,  she is in no acute distress, vital signs are stable.  Symptoms appear to be consistent with acute pansinusitis.  Will treat with Augmentin 875/125 mg twice daily for the next 7 days.  Patient was also prescribed fluticasone 50 mcg nasal spray for nasal congestion.  For her cough, patient was prescribed Bromfed dm.  Supportive care recommendations were provided and discussed with the patient to include over-the-counter analgesics for pain or discomfort, normal saline nasal spray throughout the day to help with nasal congestion and runny nose, and use of a humidifier in her bedroom at nighttime during sleep to help with nasal congestion and cough.  Patient was given indications of when follow-up may be necessary.  Patient was in agreement with this plan of care and verbalizes understanding.  All questions were answered.  Patient stable for discharge.   Final Clinical Impressions(s) / UC Diagnoses   Final diagnoses:  Acute pansinusitis, recurrence not specified     Discharge Instructions      Take medication as directed. Consider starting an allergy medication daily such as Claritin, Zyrtec, or Allegra.  You can purchase this over-the-counter. Increase fluids and get plenty of rest. May take over-the-counter ibuprofen or Tylenol as needed for pain, fever, or general discomfort. Recommend normal saline  nasal spray to help with nasal congestion throughout the day. For your cough, it may be helpful to use a humidifier at bedtime during sleep. If symptoms do not improve with this treatment, please follow-up in this clinic or with your primary care physician for further evaluation. Follow-up as needed.     ED Prescriptions     Medication Sig Dispense Auth. Provider   amoxicillin-clavulanate (AUGMENTIN) 875-125 MG tablet Take 1 tablet by mouth every 12 (twelve) hours. 14 tablet Kimanh Templeman-Warren, Sadie Haber, NP   fluticasone (FLONASE) 50 MCG/ACT nasal spray Place 2 sprays into both nostrils daily.  16 g Yousra Ivens-Warren, Sadie Haber, NP   brompheniramine-pseudoephedrine-DM 30-2-10 MG/5ML syrup Take 5 mLs by mouth 4 (four) times daily as needed. 140 mL Ivon Oelkers-Warren, Sadie Haber, NP      PDMP not reviewed this encounter.   Abran Cantor, NP 09/27/22 1431

## 2023-03-21 ENCOUNTER — Ambulatory Visit
Admission: EM | Admit: 2023-03-21 | Discharge: 2023-03-21 | Disposition: A | Discharge status: Home / Self Care | Payer: PRIVATE HEALTH INSURANCE | Attending: Family Medicine | Admitting: Family Medicine

## 2023-03-21 DIAGNOSIS — R350 Frequency of micturition: Secondary | ICD-10-CM | POA: Diagnosis not present

## 2023-03-21 DIAGNOSIS — N898 Other specified noninflammatory disorders of vagina: Secondary | ICD-10-CM | POA: Diagnosis present

## 2023-03-21 LAB — POCT URINALYSIS DIP (MANUAL ENTRY)
Glucose, UA: NEGATIVE mg/dL
Leukocytes, UA: NEGATIVE
Nitrite, UA: NEGATIVE
Spec Grav, UA: >=1.03 — AB (ref 1.010–1.025)
Urobilinogen, UA: 1 U/dL
pH, UA: 6 (ref 5.0–8.0)

## 2023-03-21 NOTE — ED Triage Notes (Addendum)
Pt c/o UTI sx's, urinary frequency, lower back pain x 1.5 weeks, pt would like to have STD swab completed as well.

## 2023-03-21 NOTE — ED Provider Notes (Signed)
[facility]    CSN: [id number] Arrival date & time: 03/21/23  1515      History   Chief Complaint No chief complaint on file.   HPI April Tyler is a 32 y.o. female.   Patient presenting today with 1.5 weeks of urinary frequency, lower back aching, urinary urgency.  Also started with vaginal discharge today.  Denies fever, chills, nausea, vomiting, hematuria, bowel changes.  So for now try anything over-the-counter for symptoms.  No known exposures to STIs but does request a swab checking for vaginal infections.  Notes that she does not stay very well-hydrated when asked.    Past Medical History:  Diagnosis Date   Burning with urination 01/31/2014   BV (bacterial vaginosis) 01/31/2014   Contraceptive management 11/29/2013   Exposure to chlamydia 03/13/2015   GBS bacteriuria 05/11/2017   tx 05/11/17   POC: neg   Tx in labor   History of elective abortion 11/29/2013   History of postpartum hypertension 04/10/2017   Requiring meds       Baby ASA started @ 19wks (entry to care)   Induced abortion 08/21/15   Miscarriage 12/22/2015   SAB July 2017   Screening for STD (sexually transmitted disease) 09/11/2015   Supervision of normal pregnancy 04/10/2017    [facility]  Initiated Care at  19wk  FOB Darius Jones  Dating By LMP c/w 2nd trimester U/S 15wk  Pap 04/10/17 neg  GC/CT Initial:  -/-              36+wks:-/-  Genetic Screen declined  CF screen Declined   Anatomic [place] Normal female  Flu vaccine Declined 04/10/17   Tdap Recommended ~ 28wks  Glucose Screen  2 hr80/111/87  GBS Pos (urine)  Feed Preference bottle  Contraception BTL, consent:    SVD (spontaneous vaginal delivery) 09/03/2017   UTI (lower urinary tract infection)    Vaginal discharge 11/29/2013   Vitamin D deficiency     Patient Active Problem List   Diagnosis Date Noted   Class 2 obesity without serious comorbidity with body mass index (BMI) of 36.0 to 36.9 in adult 09/23/2021   Hyperlipidemia  10/13/2020   Anxiety 10/13/2020   Vitamin D deficiency 05/28/2020   Screening examination for STD (sexually transmitted disease) 06/12/2019   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 Authorizing Provider  acetaminophen (TYLENOL) 500 MG tablet Take 500 mg by mouth every 6 (six) hours as needed for mild pain or headache.    [provider]  amoxicillin-clavulanate (AUGMENTIN) 875-125 MG tablet Take 1 tablet by mouth every 12 (twelve) hours. 09/27/22   Leath-Warren, Sadie Haber, NP  brompheniramine-pseudoephedrine-DM 30-2-10 MG/5ML syrup Take 5 mLs by mouth 4 (four) times daily as needed. 09/27/22   Leath-Warren, Sadie Haber, NP  fluticasone (FLONASE) 50 MCG/ACT nasal spray Place 2 sprays into both nostrils daily. 09/27/22   Leath-Warren, Sadie Haber, NP  valACYclovir (VALTREX) 500 MG tablet TAKE 1 TABLET(500 MG) BY MOUTH TWICE DAILY 04/08/22   Cyril Mourning A, NP  omeprazole (PRILOSEC) 20 MG capsule Take 1 capsule (20 mg total) by mouth daily. 05/27/20 08/13/20  [name], NP  sertraline (ZOLOFT) 25 MG tablet Take 1 tablet (25 mg total) by mouth daily. 05/27/20 08/13/20  [name], NP    Family History Family History  Problem Relation Age of Onset   Other Father        B 70 Deficiency   Asthma Daughter    Hypertension Maternal Grandmother    Obesity Paternal Grandmother    Other Sister        B 12 Defficiency   Hypertension Maternal Aunt     Social History Social History   Tobacco Use   Smoking status: Never   Smokeless tobacco: Never  Vaping Use   Vaping  status: Never Used  Substance Use Topics   Alcohol use: No    Comment: occ; not now   Drug use: No     Allergies   Patient has no known allergies.   Review of Systems Review of Systems Per HPI  Physical Exam Triage Vital Signs ED Triage Vitals  Encounter Vitals Group     BP 03/21/23 1532 128/78     Systolic BP Percentile --      Diastolic BP Percentile --      Pulse Rate 03/21/23 1532 85     Resp 03/21/23 1532 17     Temp 03/21/23 1532 98.5 F (36.9 C)     Temp Source 03/21/23 1532 Oral     SpO2 03/21/23 1532 99 %     Weight --      Height --      Head Circumference --      Peak Flow --      Pain Score 03/21/23 1536 0     Pain Loc --      Pain Education --      Exclude from Growth Chart --    No data found.  Updated Vital Signs BP 128/78 (BP Location: Right Arm)   Pulse 85   Temp 98.5 F (36.9 C) (Oral)   Resp 17   LMP 03/13/2023   SpO2 99%   Visual Acuity Right Eye Distance:   Left Eye Distance:   Bilateral Distance:    Right Eye Near:   Left Eye Near:    Bilateral Near:     Physical Exam Vitals and nursing note reviewed.  Constitutional:      Appearance: Normal appearance. She is not ill-appearing.  HENT:     Head: Atraumatic.  Eyes:     Extraocular Movements: Extraocular movements intact.     Conjunctiva/sclera: Conjunctivae normal.  Cardiovascular:     Rate and Rhythm: Normal rate and regular rhythm.     Heart sounds: Normal heart sounds.  Pulmonary:     Effort: Pulmonary effort is normal.     Breath sounds: Normal breath sounds.  Abdominal:     General: Bowel sounds are normal. There is no distension.     Palpations: Abdomen is soft.     Tenderness: There is no abdominal tenderness. There is no right CVA tenderness, left CVA tenderness or guarding.  Genitourinary:    Comments: GU exam deferred, self swab performed Musculoskeletal:  General: Normal range of motion.     Cervical back: Normal range of motion and neck supple.   Skin:    General: Skin is warm and dry.  Neurological:     Mental Status: She is alert and oriented to person, place, and time.  Psychiatric:        Mood and Affect: Mood normal.        Thought Content: Thought content normal.        Judgment: Judgment normal.      UC Treatments / Results  Labs (all labs ordered are listed, but only abnormal results are displayed) Labs Reviewed  POCT URINALYSIS DIP (MANUAL ENTRY) - Abnormal; Notable for the following components:      Result Value   Clarity, UA cloudy (*)    Bilirubin, UA small (*)    Ketones, POC UA small (15) (*)    Spec Grav, UA >=1.030 (*)    Blood, UA small (*)    Protein Ur, POC trace (*)    All other components within normal limits  CERVICOVAGINAL ANCILLARY ONLY  CERVICOVAGINAL ANCILLARY ONLY    EKG   Radiology No results found.  Procedures Procedures (including critical care time)  Medications Ordered in UC Medications - No data to display  Initial Impression / Assessment and Plan / UC Course  I have reviewed the triage vital signs and the nursing notes.  Pertinent labs & imaging results that were available during my care of the patient were reviewed by me and considered in my medical decision making (see chart for details).     Urinalysis without evidence of urinary tract infection, vaginal swab pending.  Discussed supportive over-the-counter medications, home care and will treat based on results.  Final Clinical Impressions(s) / UC Diagnoses   Final diagnoses:  Urinary frequency  Vaginal discharge     Discharge Instructions      Make sure to stay very well-hydrated, and you may use boric acid suppositories, probiotics to help with your vaginal symptoms.  We will let you know if any of your results come back positive.    ED Prescriptions   None    PDMP not reviewed this encounter.   Particia Nearing, [place] 03/21/23 1721

## 2023-03-21 NOTE — Discharge Instructions (Signed)
Make sure to stay very well-hydrated, and you may use boric acid suppositories, probiotics to help with your vaginal symptoms.  We will let you know if any of your results come back positive.

## 2023-03-22 LAB — CERVICOVAGINAL ANCILLARY ONLY
Bacterial Vaginitis (gardnerella): POSITIVE — AB
Candida Glabrata: NEGATIVE
Candida Vaginitis: NEGATIVE
Chlamydia: NEGATIVE
Comment: NEGATIVE
Comment: NEGATIVE
Comment: NEGATIVE
Comment: NEGATIVE
Comment: NEGATIVE
Comment: NORMAL
Neisseria Gonorrhea: NEGATIVE
Trichomonas: NEGATIVE

## 2023-03-23 ENCOUNTER — Telehealth: Payer: Self-pay

## 2023-03-23 MED ORDER — METRONIDAZOLE 500 MG PO TABS: 500.0000 mg | ORAL_TABLET | Freq: Two times a day (BID) | ORAL | 0 refills | Status: AC

## 2023-03-23 NOTE — Telephone Encounter (Signed)
Per protocol, pt requires tx with metronidazole. Reviewed with patient, verified pharmacy, prescription sent.

## 2023-05-11 ENCOUNTER — Encounter: Payer: PRIVATE HEALTH INSURANCE | Admitting: Nurse Practitioner

## 2023-05-23 ENCOUNTER — Ambulatory Visit: Payer: PRIVATE HEALTH INSURANCE | Admitting: Adult Health

## 2023-05-23 ENCOUNTER — Encounter: Payer: Self-pay | Admitting: Adult Health

## 2023-05-23 VITALS — BP 123/76 | HR 86 | Ht 69.0 in | Wt 229.0 lb

## 2023-05-23 DIAGNOSIS — O3680X0 Pregnancy with inconclusive fetal viability, not applicable or unspecified: Secondary | ICD-10-CM

## 2023-05-23 DIAGNOSIS — Z1331 Encounter for screening for depression: Secondary | ICD-10-CM | POA: Diagnosis not present

## 2023-05-23 DIAGNOSIS — Z3A01 Less than 8 weeks gestation of pregnancy: Secondary | ICD-10-CM | POA: Diagnosis not present

## 2023-05-23 DIAGNOSIS — R350 Frequency of micturition: Secondary | ICD-10-CM

## 2023-05-23 DIAGNOSIS — Z3201 Encounter for pregnancy test, result positive: Secondary | ICD-10-CM | POA: Diagnosis not present

## 2023-05-23 LAB — POCT URINALYSIS DIPSTICK OB
Glucose, UA: NEGATIVE
Ketones, UA: NEGATIVE
Leukocytes, UA: NEGATIVE
Nitrite, UA: NEGATIVE
POC,PROTEIN,UA: NEGATIVE

## 2023-05-23 LAB — POCT URINE PREGNANCY: Preg Test, Ur: POSITIVE — AB

## 2023-05-23 NOTE — Progress Notes (Signed)
  Subjective:     Patient ID: April Tyler, female   DOB: [birth year], 32 y.o.   MRN: [id number]  HPI April Tyler is a 32 year old black female, single, [id number] in for pregnancy confirmation, has missed a period and had 1+HPT.   PCP is April Pereyra NP  Review of Systems +missed period, with +HPT +urinary frequncy for 2 weeks  Reviewed past medical,surgical, social and family history. Reviewed medications and allergies.     Objective:   Physical Exam BP 123/76 (BP Location: Left Arm, Patient Position: Sitting, Cuff Size: Normal)   Pulse 86   Ht 5' 9 (1.753 m)   Wt 229 lb (103.9 kg)   LMP 04/11/2023   BMI 33.82 kg/m  urine dipstick 1+blood.UPT is positive. About 6 weeks by LMP, with EDD 01/16/24.Skin warm and dry. Neck: mid line trachea, normal thyroid, good ROM, no lymphadenopathy noted. Lungs: clear to ausculation bilaterally. Cardiovascular: regular rate and rhythm.    AA is 1    05/23/2023    3:36 PM 09/23/2021    3:10 PM 05/27/2020    4:33 PM  Depression screen PHQ 2/9  Decreased Interest 1 0 1  Down, Depressed, Hopeless 0 0 1  PHQ - 2 Score 1 0 2  Altered sleeping 2 0 0  Tired, decreased energy 1 2 3   Change in appetite 2 0 1  Feeling bad or failure about yourself  0 0 0  Trouble concentrating 1 0 1  Moving slowly or fidgety/restless 0 0 0  Suicidal thoughts 0 0 0  PHQ-9 Score 7 2 7   Difficult doing work/chores  Not difficult at all Not difficult at all       05/23/2023    3:36 PM 09/23/2021    3:22 PM  GAD 7 : Generalized Anxiety Score  Nervous, Anxious, on Edge 1 2  Control/stop worrying 0 3  Worry too much - different things 1 3  Trouble relaxing 0 0  Restless 0 0  Easily annoyed or irritable 0 2  Afraid - awful might happen 0 2  Total GAD 7 Score 2 12    Upstream - 05/23/23 1544       Pregnancy Intention Screening   Does the patient want to become pregnant in the next year? N/A    Does the patient's partner want to become pregnant in the next year? N/A    Would  the patient like to discuss contraceptive options today? N/A      Contraception Wrap Up   Current Method Pregnant/Seeking Pregnancy    End Method Pregnant/Seeking Pregnancy    Contraception Counseling Provided No               Assessment:     1. Pregnancy examination or test, positive result (Primary) - POCT urine pregnancy  2. Urinary frequency +urinary frequency for 2 weeks  US  C&S sent to rule out UTI - POC Urinalysis Dipstick OB - Urine Culture - Urinalysis, Routine w reflex microscopic  3. Less than [redacted] weeks gestation of pregnancy Take PNV  4. Encounter to determine fetal viability of pregnancy, single or unspecified fetus Return in 2 weeks for dating US  - US  OB Comp Less 14 Wks; Future     Plan:     Review OB packet

## 2023-05-24 LAB — URINALYSIS, ROUTINE W REFLEX MICROSCOPIC
Bilirubin, UA: NEGATIVE
Glucose, UA: NEGATIVE
Ketones, UA: NEGATIVE
Leukocytes,UA: NEGATIVE
Nitrite, UA: NEGATIVE
Protein,UA: NEGATIVE
RBC, UA: NEGATIVE
Specific Gravity, UA: 1.019 (ref 1.005–1.030)
Urobilinogen, Ur: 1 mg/dL (ref 0.2–1.0)
pH, UA: 6.5 (ref 5.0–7.5)

## 2023-05-24 NOTE — L&D Delivery Note (Addendum)
 LABOR COURSE April Tyler is a 33 y.o. female [phone] with IUP at [redacted]w[redacted]d by LMP c/w US  at 8wk presenting for SOL. She was found to be 9cm dilated in the MAU and upon arriving to the L&D floor her bag of water ruptured spontaneously and she progressed to complete.  Delivery Note Called to room and patient was complete and pushing. Pt was unable to undergo epidural in time for delivery. Head delivered 11:18 in the LOP position. No nuchal cord present. Shoulder and body delivered in usual fashion. At  11:18 a viable female was delivered via Vaginal, Spontaneous (Presentation: LOP;).  Infant with spontaneous cry, placed on mother's abdomen, dried and stimulated. Cord clamped x 2 after 1-minute delay, and cut by father. Cord blood drawn. Placenta delivered spontaneously with gentle cord traction. Appears intact. Fundus firm with massage and hemostasis was achieved with Pitocin . Labia, perineum, vagina, and cervix inspected with periurethral 1st degree lacerations at 10 and 2 o'clock along with a 1st degree perineal tear at 6 o'clock. All were hemostatic and shared decision making to not repair as she did not have analgesia. Post partum IUD was also deferred for this reason.    APGAR: pending, weight pending .   Cord: 3VC with the following complications:none.     Anesthesia: None Episiotomy: None Lacerations: none Suture Repair: none QBL (mL): 182  Mom to postpartum.  Baby to Couplet care / Skin to Skin.  Shana Squires, MD @TODAY @ 11:41 AM

## 2023-05-25 LAB — URINE CULTURE

## 2023-05-30 ENCOUNTER — Other Ambulatory Visit: Payer: Self-pay | Admitting: Adult Health

## 2023-05-30 MED ORDER — ONDANSETRON HCL 4 MG PO TABS: 4.0000 mg | ORAL_TABLET | Freq: Three times a day (TID) | ORAL | 1 refills | Status: DC | PRN

## 2023-05-30 NOTE — Progress Notes (Signed)
 Rx zofran

## 2023-06-06 ENCOUNTER — Ambulatory Visit: Payer: PRIVATE HEALTH INSURANCE

## 2023-06-06 DIAGNOSIS — Z3491 Encounter for supervision of normal pregnancy, unspecified, first trimester: Secondary | ICD-10-CM

## 2023-06-06 DIAGNOSIS — Z3A08 8 weeks gestation of pregnancy: Secondary | ICD-10-CM

## 2023-06-06 DIAGNOSIS — O3680X0 Pregnancy with inconclusive fetal viability, not applicable or unspecified: Secondary | ICD-10-CM

## 2023-06-06 NOTE — Progress Notes (Signed)
 [place] 8 wks,single IUP with yolk sac,normal ovaries,CRL 19.07 mm,subchorionic hemorrhage 3.4 x 2.1 x 2.2 cm,FHR 167 bpm

## 2023-06-10 ENCOUNTER — Other Ambulatory Visit: Payer: Self-pay

## 2023-06-10 ENCOUNTER — Encounter (HOSPITAL_COMMUNITY): Payer: Self-pay | Admitting: *Deleted

## 2023-06-10 ENCOUNTER — Emergency Department (HOSPITAL_COMMUNITY)
Admission: EM | Admit: 2023-06-10 | Discharge: 2023-06-10 | Disposition: A | Discharge status: Home / Self Care | Payer: PRIVATE HEALTH INSURANCE | Attending: Emergency Medicine | Admitting: Emergency Medicine

## 2023-06-10 DIAGNOSIS — N3000 Acute cystitis without hematuria: Secondary | ICD-10-CM

## 2023-06-10 DIAGNOSIS — Z3A08 8 weeks gestation of pregnancy: Secondary | ICD-10-CM | POA: Diagnosis not present

## 2023-06-10 DIAGNOSIS — O26891 Other specified pregnancy related conditions, first trimester: Secondary | ICD-10-CM | POA: Diagnosis present

## 2023-06-10 DIAGNOSIS — O2311 Infections of bladder in pregnancy, first trimester: Secondary | ICD-10-CM | POA: Insufficient documentation

## 2023-06-10 LAB — URINALYSIS, ROUTINE W REFLEX MICROSCOPIC
Bilirubin Urine: NEGATIVE
Glucose, UA: NEGATIVE mg/dL
Ketones, ur: NEGATIVE mg/dL
Nitrite: NEGATIVE
Protein, ur: NEGATIVE mg/dL
RBC / HPF: >50 RBC/hpf (ref 0–5)
Specific Gravity, Urine: 1.024 (ref 1.005–1.030)
pH: 7 (ref 5.0–8.0)

## 2023-06-10 LAB — WET PREP, GENITAL
Clue Cells Wet Prep HPF POC: NONE SEEN
Sperm: NONE SEEN
Trich, Wet Prep: NONE SEEN
WBC, Wet Prep HPF POC: <10 (ref <10)
Yeast Wet Prep HPF POC: NONE SEEN

## 2023-06-10 MED ORDER — CEPHALEXIN 500 MG PO CAPS
500.0000 mg | ORAL_CAPSULE | Freq: Once | ORAL | Status: AC
  Administered 2023-06-10: 500 mg via ORAL
  Filled 2023-06-10: qty 1

## 2023-06-10 MED ORDER — CEPHALEXIN 500 MG PO CAPS: 500.0000 mg | ORAL_CAPSULE | Freq: Three times a day (TID) | ORAL | 0 refills | Status: AC

## 2023-06-10 NOTE — ED Triage Notes (Addendum)
Pt with sudden lower back and left abd, minor discomfort with urination today.  Denies any blood or cloudiness to urine or foul smell.  Pt is [redacted] weeks pregnant, denies any vaginal bleeding.

## 2023-06-10 NOTE — Discharge Instructions (Addendum)
You are seen in the emergency department today with concerns of back pain.  Your urinalysis appears to be concerning for possible UTI.  You did not have any abnormal findings on your wet prep.  You were started on Keflex and a prescription for cephalexin was sent to your pharmacy.  Please take it as prescribed for the next 7 days.  If you have any new or worsening symptoms return the emergency department.  Please follow-up with your OB/GYN for further evaluation.

## 2023-06-11 NOTE — ED Provider Notes (Signed)
[place] EMERGENCY DEPARTMENT AT [facility] Provider Note   CSN: [id number] Arrival date & time: 06/10/23  1713     History Chief Complaint  Patient presents with   Back Pain    April Tyler is a 33 y.o. female.  Patient presents to the emergency department with concerns of back pain as well as suprapubic discomfort.  She endorses some urinary urgency and dysuria starting today.  Denies any hematuria, cloudiness, or foul odor to the urine.  She estimates that she is currently [redacted] weeks pregnant at this time.  Denies any vaginal bleeding.  No recent fever chills or bodyaches.  No nausea or vomiting either.   Back Pain      Home Medications Prior to Admission medications   Medication Sig Start Date End Date Taking? Authorizing Provider  cephALEXin (KEFLEX) 500 MG capsule Take 1 capsule (500 mg total) by mouth 3 (three) times daily for 5 days. 06/10/23 06/15/23 Yes Maryanna Shape A, PA-C  ondansetron (ZOFRAN) 4 MG tablet Take 1 tablet (4 mg total) by mouth every 8 (eight) hours as needed. 05/30/23   Adline Potter, NP  acetaminophen (TYLENOL) 500 MG tablet Take 500 mg by mouth every 6 (six) hours as needed for mild pain or headache.    [provider]  valACYclovir (VALTREX) 500 MG tablet TAKE 1 TABLET(500 MG) BY MOUTH TWICE DAILY Patient taking differently: as needed. 04/08/22   Adline Potter, NP  omeprazole (PRILOSEC) 20 MG capsule Take 1 capsule (20 mg total) by mouth daily. 05/27/20 08/13/20  [name], NP  sertraline (ZOLOFT) 25 MG tablet Take 1 tablet (25 mg total) by mouth daily. 05/27/20 08/13/20  [name], NP      Allergies    Patient has no known allergies.    Review of Systems   Review of Systems  Musculoskeletal:  Positive for back pain.  All other systems reviewed and are negative.   Physical Exam Updated Vital Signs BP 127/65   Pulse 76   Temp 98.4 F (36.9 C)   Resp 18   Ht 5\' 8"  (1.727 m)   Wt 103.9 kg   LMP 04/11/2023    SpO2 100%   BMI 34.82 kg/m  Physical Exam Vitals and nursing note reviewed.  Constitutional:      General: She is not in acute distress.    Appearance: She is well-developed.  HENT:     Head: Normocephalic and atraumatic.  Eyes:     Conjunctiva/sclera: Conjunctivae normal.  Cardiovascular:     Rate and Rhythm: Normal rate and regular rhythm.     Heart sounds: No murmur heard. Pulmonary:     Effort: Pulmonary effort is normal. No respiratory distress.     Breath sounds: Normal breath sounds.  Abdominal:     Palpations: Abdomen is soft.     Tenderness: There is abdominal tenderness in the suprapubic area. There is no right CVA tenderness or left CVA tenderness.  Musculoskeletal:        General: No swelling.     Cervical back: Neck supple.  Skin:    General: Skin is warm and dry.     Capillary Refill: Capillary refill takes less than 2 seconds.  Neurological:     Mental Status: She is alert.  Psychiatric:        Mood and Affect: Mood normal.     ED Results / Procedures / Treatments   Labs (all labs ordered are listed, but only  abnormal results are displayed) Labs Reviewed  URINALYSIS, ROUTINE W REFLEX MICROSCOPIC - Abnormal; Notable for the following components:      Result Value   APPearance HAZY (*)    Hgb urine dipstick MODERATE (*)    Leukocytes,Ua TRACE (*)    Bacteria, UA RARE (*)    All other components within normal limits  WET PREP, GENITAL  GC/CHLAMYDIA PROBE AMP ([place]) NOT AT [facility]    EKG None  Radiology No results found.  Procedures Procedures    Medications Ordered in ED Medications  cephALEXin (KEFLEX) capsule 500 mg (500 mg Oral Given 06/10/23 2048)    ED Course/ Medical Decision Making/ A&P                                 Medical Decision Making Amount and/or Complexity of Data Reviewed Labs: ordered.  Risk Prescription drug management.   This patient presents to the ED for concern of back pain, dysuria.  Differential  diagnosis includes UTI, pyelonephritis, urolithiasis, diverticulitis   Lab Tests:  I Ordered, and personally interpreted labs.  The pertinent results include: UA with concerns of infection with some hemoglobin, leukocytes, and bacteria seen, wet prep unremarkable, GC/chlamydia collected and pending   Medicines ordered and prescription drug management:  I ordered medication including Keflex for UTI Reevaluation of the patient after these medicines showed that the patient stayed the same I have reviewed the patients home medicines and have made adjustments as needed   Problem List / ED Course:  Patient presents the emergency department with concerns of dysuria and some back discomfort.  States this began earlier today.  The dysuria and increased frequency in urination also began earlier today.  Denies any hematuria, cloudy urine, or malodorous urine.  She states she is about [redacted] weeks pregnant and denies any vaginal bleeding.  Does endorse some concerns for possible STIs and has not been assessed up to this point in her pregnancy. Exam reveals mild suprapubic discomfort but no significant flank pain or abdominal distention or tenderness.  I suspect this may be a UTI given patient's symptoms however will obtain urine for assessment of this. UA appears consistent with possible stone as there is bacteria, leukocytes and some hemoglobin present.  In the setting of locking flank tenderness or abdominal tenderness, I do not suspect this is a stone is that this is likely UTI.  Will treat as a UTI.  Will hold off on treating for STIs until results are back as patient is currently pregnant and would prefer to reduce antibiotic exposure this early in pregnancy.  Patient discharged home with a prescription for Keflex.  No other acute focal concerns at this time.  Discharged home in stable condition.   Social Determinants of Health:  Patient with adverse trimester pregnancy  Final Clinical Impression(s) /  ED Diagnoses Final diagnoses:  Acute cystitis without hematuria    Rx / DC Orders ED Discharge Orders          Ordered    cephALEXin (KEFLEX) 500 MG capsule  3 times daily        06/10/23 2101              Smitty Knudsen, PA-C 06/11/23 1742    Bethann Berkshire, MD 06/12/23 1145

## 2023-06-12 LAB — GC/CHLAMYDIA PROBE AMP (~~LOC~~) NOT AT ARMC
Chlamydia: NEGATIVE
Comment: NEGATIVE
Comment: NORMAL
Neisseria Gonorrhea: NEGATIVE

## 2023-06-30 ENCOUNTER — Other Ambulatory Visit: Payer: Self-pay | Admitting: Obstetrics & Gynecology

## 2023-06-30 DIAGNOSIS — Z3490 Encounter for supervision of normal pregnancy, unspecified, unspecified trimester: Secondary | ICD-10-CM | POA: Insufficient documentation

## 2023-06-30 DIAGNOSIS — Z3682 Encounter for antenatal screening for nuchal translucency: Secondary | ICD-10-CM

## 2023-07-03 ENCOUNTER — Ambulatory Visit: Payer: PRIVATE HEALTH INSURANCE

## 2023-07-03 ENCOUNTER — Ambulatory Visit (INDEPENDENT_AMBULATORY_CARE_PROVIDER_SITE_OTHER): Payer: PRIVATE HEALTH INSURANCE | Admitting: Women's Health

## 2023-07-03 ENCOUNTER — Encounter: Payer: PRIVATE HEALTH INSURANCE | Admitting: *Deleted

## 2023-07-03 ENCOUNTER — Encounter: Payer: Self-pay | Admitting: Women's Health

## 2023-07-03 ENCOUNTER — Other Ambulatory Visit (HOSPITAL_COMMUNITY)
Admission: RE | Admit: 2023-07-03 | Discharge: 2023-07-03 | Disposition: A | Discharge status: Home / Self Care | Payer: PRIVATE HEALTH INSURANCE | Source: Ambulatory Visit | Attending: Women's Health | Admitting: Women's Health

## 2023-07-03 VITALS — BP 134/84 | HR 82 | Wt 235.0 lb

## 2023-07-03 DIAGNOSIS — Z131 Encounter for screening for diabetes mellitus: Secondary | ICD-10-CM

## 2023-07-03 DIAGNOSIS — B009 Herpesviral infection, unspecified: Secondary | ICD-10-CM

## 2023-07-03 DIAGNOSIS — Z113 Encounter for screening for infections with a predominantly sexual mode of transmission: Secondary | ICD-10-CM

## 2023-07-03 DIAGNOSIS — Z124 Encounter for screening for malignant neoplasm of cervix: Secondary | ICD-10-CM | POA: Diagnosis present

## 2023-07-03 DIAGNOSIS — Z3481 Encounter for supervision of other normal pregnancy, first trimester: Secondary | ICD-10-CM | POA: Insufficient documentation

## 2023-07-03 DIAGNOSIS — Z1332 Encounter for screening for maternal depression: Secondary | ICD-10-CM

## 2023-07-03 DIAGNOSIS — O26891 Other specified pregnancy related conditions, first trimester: Secondary | ICD-10-CM

## 2023-07-03 DIAGNOSIS — Z3682 Encounter for antenatal screening for nuchal translucency: Secondary | ICD-10-CM

## 2023-07-03 DIAGNOSIS — N898 Other specified noninflammatory disorders of vagina: Secondary | ICD-10-CM | POA: Insufficient documentation

## 2023-07-03 DIAGNOSIS — Z3480 Encounter for supervision of other normal pregnancy, unspecified trimester: Secondary | ICD-10-CM

## 2023-07-03 DIAGNOSIS — Z8759 Personal history of other complications of pregnancy, childbirth and the puerperium: Secondary | ICD-10-CM

## 2023-07-03 DIAGNOSIS — Z8679 Personal history of other diseases of the circulatory system: Secondary | ICD-10-CM | POA: Diagnosis not present

## 2023-07-03 DIAGNOSIS — Z3A11 11 weeks gestation of pregnancy: Secondary | ICD-10-CM | POA: Diagnosis not present

## 2023-07-03 DIAGNOSIS — Z6834 Body mass index (BMI) 34.0-34.9, adult: Secondary | ICD-10-CM

## 2023-07-03 MED ORDER — DOXYLAMINE-PYRIDOXINE 10-10 MG PO TBEC: DELAYED_RELEASE_TABLET | ORAL | 6 refills | Status: DC

## 2023-07-03 MED ORDER — ASPIRIN 81 MG PO TBEC: 162.0000 mg | DELAYED_RELEASE_TABLET | Freq: Every day | ORAL | 2 refills | Status: DC

## 2023-07-03 NOTE — Progress Notes (Signed)
 INITIAL OBSTETRICAL VISIT Patient name: April Tyler MRN [id number]  Date of birth: 1990/10/25 Chief Complaint:   Initial Prenatal Visit  History of Present Illness:   April Tyler is a 33 y.o. [id number] African-American female at [redacted]w[redacted]d by LMP c/w u/s at 8 weeks with an Estimated Date of Delivery: 01/16/24 being seen today for her initial obstetrical visit.   Patient's last menstrual period was 04/11/2023. Her obstetrical history is significant for  term SVB x 4, h/o PPHTN, SAB x 1, EAB x 2 .   Today she reports nausea, no vomiting, has zofran  and it doesn't help.  Took keflex  for ? Kidney infection last week, thinks may have yeast infection, has d/c, but no odor/itching/irritation.  Last pap 2018. Results were: NILM w/ HRHPV not done     07/03/2023    3:04 PM 05/23/2023    3:36 PM 09/23/2021    3:10 PM 05/27/2020    4:33 PM 04/10/2017    2:40 PM  Depression screen PHQ 2/9  Decreased Interest 0 1 0 1 0  Down, Depressed, Hopeless 0 0 0 1 0  PHQ - 2 Score 0 1 0 2 0  Altered sleeping 0 2 0 0 2  Tired, decreased energy 0 1 2 3  0  Change in appetite 0 2 0 1 0  Feeling bad or failure about yourself  0 0 0 0 0  Trouble concentrating 0 1 0 1   Moving slowly or fidgety/restless 0 0 0 0 0  Suicidal thoughts 0 0 0 0 0  PHQ-9 Score 0 7 2 7 2   Difficult doing work/chores   Not difficult at all Not difficult at all Not difficult at all        07/03/2023    3:04 PM 05/23/2023    3:36 PM 09/23/2021    3:22 PM  GAD 7 : Generalized Anxiety Score  Nervous, Anxious, on Edge 0 1 2  Control/stop worrying 0 0 3  Worry too much - different things 0 1 3  Trouble relaxing 0 0 0  Restless 0 0 0  Easily annoyed or irritable 0 0 2  Afraid - awful might happen 0 0 2  Total GAD 7 Score 0 2 12     Review of Systems:   Pertinent items are noted in HPI Denies cramping/contractions, leakage of fluid, vaginal bleeding, abnormal vaginal discharge w/ itching/odor/irritation, headaches, visual changes,  shortness of breath, chest pain, abdominal pain, severe nausea/vomiting, or problems with urination or bowel movements unless otherwise stated above.  Pertinent History Reviewed:  Reviewed past medical,surgical, social, obstetrical and family history.  Reviewed problem list, medications and allergies. OB History  Gravida Para Term Preterm AB Living  8 4 4  3 4   SAB IAB Ectopic Multiple Live Births  1 2  0 4    # Outcome Date GA Lbr Len/2nd Weight Sex Type Anes PTL Lv  8 Current           7 Term 09/03/17 [redacted]w[redacted]d / 00:16 7 lb 13.3 oz (3.552 kg) F Vag-Spont EPI  LIV  6 SAB 12/2015          5 IAB 2014 [redacted]w[redacted]d         4 IAB 2013 [redacted]w[redacted]d         3 Term 04/30/10 [redacted]w[redacted]d  8 lb 12 oz (3.969 kg) M Vag-Spont EPI N LIV     Complications: Postpartum hypertension  2 Term 05/15/06 [redacted]w[redacted]d  7 lb (3.175  kg) F Vag-Spont None N LIV  1 Term 06/12/05 [redacted]w[redacted]d  8 lb 2 oz (3.685 kg) M Vag-Spont Local N LIV   Physical Assessment:   Vitals:   07/03/23 1423  BP: 134/84  Pulse: 82  Weight: 235 lb (106.6 kg)  Body mass index is 35.73 kg/m.       Physical Examination:  General appearance - well appearing, and in no distress  Mental status - alert, oriented to person, place, and time  Psych:  She has a normal mood and affect  Skin - warm and dry, normal color, no suspicious lesions noted  Chest - effort normal, all lung fields clear to auscultation bilaterally  Heart - normal rate and regular rhythm  Abdomen - soft, nontender  Extremities:  No swelling or varicosities noted  Pelvic - VULVA: normal appearing vulva with no masses, tenderness or lesions  VAGINA: normal appearing vagina with normal color and discharge, no lesions  CERVIX: normal appearing cervix without discharge or lesions, no CMT  Thin prep pap is done w/ HR HPV cotesting  Chaperone: Peggy Dones    TODAY'S NT US  11+6 wks,measurements c/w dates,FHR 155 bpm,normal ovaries,NT 1.6,NB present,CRL 66.45 mm   No results found for this or any previous  visit (from the past 24 hours).  Assessment & Plan:  1) Low-Risk Pregnancy [id number] at [redacted]w[redacted]d with an Estimated Date of Delivery: 01/16/24   2) Initial OB visit  3) H/O PPHTN> ASA 162mg , baseline labs  4) Nausea> rx diclegis   5) Vaginal d/c> CV swab  Meds:  Meds ordered this encounter  Medications   aspirin  EC 81 MG tablet    Sig: Take 2 tablets (162 mg total) by mouth daily. Swallow whole.    Dispense:  180 tablet    Refill:  2   Doxylamine -Pyridoxine  (DICLEGIS ) 10-10 MG TBEC    Sig: 2 tabs q hs, if sx persist add 1 tab q am on day 3, if sx persist add 1 tab q afternoon on day 4    Dispense:  100 tablet    Refill:  6    Initial labs obtained Continue prenatal vitamins Reviewed n/v relief measures and warning s/s to report Reviewed recommended weight gain based on pre-gravid BMI Encouraged well-balanced diet Genetic & carrier screening discussed: requests Panorama and NT/IT, declines Horizon  Ultrasound discussed; fetal survey: requested CCNC completed> form faxed if has or is planning to apply for medicaid The nature of [place] [employer] with multiple MDs and other Advanced Practice Providers was explained to patient; also emphasized that fellows, residents, and students are part of our team. Does have home bp cuff. Office bp cuff given: no. Rx sent: n/a. Check bp weekly, let us  know if consistently >140/90.    Follow-up: Return in about 4 weeks (around 07/31/2023) for LROB, 2nd IT, CNM, in person; 8wks from now anatomy u/s and LROB w/ CNM.   Orders Placed This Encounter  Procedures   Urine Culture   CBC/D/Plt+RPR+Rh+ABO+RubIgG...   Comprehensive metabolic panel   Protein / creatinine ratio, urine   PANORAMA PRENATAL TEST   Hemoglobin A1c   Integrated 1    Ferd Householder CNM, [facility] 07/03/2023 3:54 PM

## 2023-07-03 NOTE — Progress Notes (Signed)
 US  11+6 wks,measurements c/w dates,FHR 155 bpm,normal ovaries,NT 1.6,NB present,CRL 66.45 mm

## 2023-07-03 NOTE — Patient Instructions (Signed)
 April Tyler, thank you for choosing our office today! We appreciate the opportunity to meet your healthcare needs. You may receive a short survey by mail, e-mail, or through [employer]. If you are happy with your care we would appreciate if you could take just a few minutes to complete the survey questions. We read all of your comments and take your feedback very seriously. Thank you again for choosing our office.  [facility] [employer] [facility] at [facility]  [facility] ([address] [place], [place] [address]) Entrance C, located off of [address] Free 24/7 valet parking   Nausea & Vomiting Have saltine crackers or pretzels by your bed and eat a few bites before you raise your head out of bed in the morning Eat small frequent meals throughout the day instead of large meals Drink plenty of fluids throughout the day to stay hydrated, just don't drink a lot of fluids with your meals.  This can make your stomach fill up faster making you feel sick Do not brush your teeth right after you eat Products with real ginger are good for nausea, like ginger ale and ginger hard candy Make sure it says made with real ginger! Sucking on sour candy like lemon heads is also good for nausea If your prenatal vitamins make you nauseated, take them at night so you will sleep through the nausea Sea Bands If you feel like you need medicine for the nausea & vomiting please let us  know If you are unable to keep any fluids or food down please let us  know   Constipation Drink plenty of fluid, preferably water, throughout the day Eat foods high in fiber such as fruits, vegetables, and grains Exercise, such as walking, is a good way to keep your bowels regular Drink warm fluids, especially warm prune juice, or decaf coffee Eat a 1/2 cup of real oatmeal (not instant), 1/2 cup applesauce, and 1/2-1 cup warm prune juice every day If needed, you may take Colace (docusate sodium ) stool  softener once or twice a day to help keep the stool soft.  If you still are having problems with constipation, you may take Miralax once daily as needed to help keep your bowels regular.   Home Blood Pressure Monitoring for Patients   Your provider has recommended that you check your blood pressure (BP) at least once a week at home. If you do not have a blood pressure cuff at home, one will be provided for you. Contact your provider if you have not received your monitor within 1 week.   Helpful Tips for Accurate Home Blood Pressure Checks  Don't smoke, exercise, or drink caffeine 30 minutes before checking your BP Use the restroom before checking your BP (a full bladder can raise your pressure) Relax in a comfortable upright chair Feet on the ground Left arm resting comfortably on a flat surface at the level of your heart Legs uncrossed Back supported Sit quietly and don't talk Place the cuff on your bare arm Adjust snuggly, so that only two fingertips can fit between your skin and the top of the cuff Check 2 readings separated by at least one minute Keep a log of your BP readings For a visual, please reference this diagram: http://ccnc.care/bpdiagram  Provider Name: [facility]     Phone: [phone]  Zone 1: ALL CLEAR  Continue to monitor your symptoms:  BP reading is less than 140 (top number) or less than 90 (bottom  number)  No right upper stomach pain No headaches or seeing spots No feeling nauseated or throwing up No swelling in face and hands  Zone 2: CAUTION Call your doctor's office for any of the following:  BP reading is greater than 140 (top number) or greater than 90 (bottom number)  Stomach pain under your ribs in the middle or right side Headaches or seeing spots Feeling nauseated or throwing up Swelling in face and hands  Zone 3: EMERGENCY  Seek immediate medical care if you have any of the following:  BP reading is greater than160 (top number) or  greater than 110 (bottom number) Severe headaches not improving with Tylenol  Serious difficulty catching your breath Any worsening symptoms from Zone 2    First Trimester of Pregnancy The first trimester of pregnancy is from week 1 until the end of week 12 (months 1 through 3). A week after a sperm fertilizes an egg, the egg will implant on the wall of the uterus. This embryo will begin to develop into a baby. Genes from you and your partner are forming the baby. The female genes determine whether the baby is a boy or a girl. At 6-8 weeks, the eyes and face are formed, and the heartbeat can be seen on ultrasound. At the end of 12 weeks, all the baby's organs are formed.  Now that you are pregnant, you will want to do everything you can to have a healthy baby. Two of the most important things are to get good prenatal care and to follow your health care provider's instructions. Prenatal care is all the medical care you receive before the baby's birth. This care will help prevent, find, and treat any problems during the pregnancy and childbirth. BODY CHANGES Your body goes through many changes during pregnancy. The changes vary from woman to woman.  You may gain or lose a couple of pounds at first. You may feel sick to your stomach (nauseous) and throw up (vomit). If the vomiting is uncontrollable, call your health care provider. You may tire easily. You may develop headaches that can be relieved by medicines approved by your health care provider. You may urinate more often. Painful urination may mean you have a bladder infection. You may develop heartburn as a result of your pregnancy. You may develop constipation because certain hormones are causing the muscles that push waste through your intestines to slow down. You may develop hemorrhoids or swollen, bulging veins (varicose veins). Your breasts may begin to grow larger and become tender. Your nipples may stick out more, and the tissue that  surrounds them (areola) may become darker. Your gums may bleed and may be sensitive to brushing and flossing. Dark spots or blotches (chloasma, mask of pregnancy) may develop on your face. This will likely fade after the baby is born. Your menstrual periods will stop. You may have a loss of appetite. You may develop cravings for certain kinds of food. You may have changes in your emotions from day to day, such as being excited to be pregnant or being concerned that something may go wrong with the pregnancy and baby. You may have more vivid and strange dreams. You may have changes in your hair. These can include thickening of your hair, rapid growth, and changes in texture. Some women also have hair loss during or after pregnancy, or hair that feels dry or thin. Your hair will most likely return to normal after your baby is born. WHAT TO EXPECT AT YOUR PRENATAL  VISITS During a routine prenatal visit: You will be weighed to make sure you and the baby are growing normally. Your blood pressure will be taken. Your abdomen will be measured to track your baby's growth. The fetal heartbeat will be listened to starting around week 10 or 12 of your pregnancy. Test results from any previous visits will be discussed. Your health care provider may ask you: How you are feeling. If you are feeling the baby move. If you have had any abnormal symptoms, such as leaking fluid, bleeding, severe headaches, or abdominal cramping. If you have any questions. Other tests that may be performed during your first trimester include: Blood tests to find your blood type and to check for the presence of any previous infections. They will also be used to check for low iron  levels (anemia) and Rh antibodies. Later in the pregnancy, blood tests for diabetes will be done along with other tests if problems develop. Urine tests to check for infections, diabetes, or protein in the urine. An ultrasound to confirm the proper growth  and development of the baby. An amniocentesis to check for possible genetic problems. Fetal screens for spina bifida and Down syndrome. You may need other tests to make sure you and the baby are doing well. HOME CARE INSTRUCTIONS  Medicines Follow your health care provider's instructions regarding medicine use. Specific medicines may be either safe or unsafe to take during pregnancy. Take your prenatal vitamins as directed. If you develop constipation, try taking a stool softener if your health care provider approves. Diet Eat regular, well-balanced meals. Choose a variety of foods, such as meat or vegetable-based protein, fish, milk and low-fat dairy products, vegetables, fruits, and whole grain breads and cereals. Your health care provider will help you determine the amount of weight gain that is right for you. Avoid raw meat and uncooked cheese. These carry germs that can cause birth defects in the baby. Eating four or five small meals rather than three large meals a day may help relieve nausea and vomiting. If you start to feel nauseous, eating a few soda crackers can be helpful. Drinking liquids between meals instead of during meals also seems to help nausea and vomiting. If you develop constipation, eat more high-fiber foods, such as fresh vegetables or fruit and whole grains. Drink enough fluids to keep your urine clear or pale yellow. Activity and Exercise Exercise only as directed by your health care provider. Exercising will help you: Control your weight. Stay in shape. Be prepared for labor and delivery. Experiencing pain or cramping in the lower abdomen or low back is a good sign that you should stop exercising. Check with your health care provider before continuing normal exercises. Try to avoid standing for long periods of time. Move your legs often if you must stand in one place for a long time. Avoid heavy lifting. Wear low-heeled shoes, and practice good posture. You may  continue to have sex unless your health care provider directs you otherwise. Relief of Pain or Discomfort Wear a good support bra for breast tenderness.   Take warm sitz baths to soothe any pain or discomfort caused by hemorrhoids. Use hemorrhoid cream if your health care provider approves.   Rest with your legs elevated if you have leg cramps or low back pain. If you develop varicose veins in your legs, wear support hose. Elevate your feet for 15 minutes, 3-4 times a day. Limit salt in your diet. Prenatal Care Schedule your prenatal visits by the  twelfth week of pregnancy. They are usually scheduled monthly at first, then more often in the last 2 months before delivery. Write down your questions. Take them to your prenatal visits. Keep all your prenatal visits as directed by your health care provider. Safety Wear your seat belt at all times when driving. Make a list of emergency phone numbers, including numbers for family, friends, the hospital, and police and fire departments. General Tips Ask your health care provider for a referral to a local prenatal education class. Begin classes no later than at the beginning of month 6 of your pregnancy. Ask for help if you have counseling or nutritional needs during pregnancy. Your health care provider can offer advice or refer you to specialists for help with various needs. Do not use hot tubs, steam rooms, or saunas. Do not douche or use tampons or scented sanitary pads. Do not cross your legs for long periods of time. Avoid cat litter boxes and soil used by cats. These carry germs that can cause birth defects in the baby and possibly loss of the fetus by miscarriage or stillbirth. Avoid all smoking, herbs, alcohol, and medicines not prescribed by your health care provider. Chemicals in these affect the formation and growth of the baby. Schedule a dentist appointment. At home, brush your teeth with a soft toothbrush and be gentle when you floss. SEEK  MEDICAL CARE IF:  You have dizziness. You have mild pelvic cramps, pelvic pressure, or nagging pain in the abdominal area. You have persistent nausea, vomiting, or diarrhea. You have a bad smelling vaginal discharge. You have pain with urination. You notice increased swelling in your face, hands, legs, or ankles. SEEK IMMEDIATE MEDICAL CARE IF:  You have a fever. You are leaking fluid from your vagina. You have spotting or bleeding from your vagina. You have severe abdominal cramping or pain. You have rapid weight gain or loss. You vomit blood or material that looks like coffee grounds. You are exposed to [place] measles and have never had them. You are exposed to fifth disease or chickenpox. You develop a severe headache. You have shortness of breath. You have any kind of trauma, such as from a fall or a car accident. Document Released: 05/03/2001 Document Revised: 09/23/2013 Document Reviewed: 03/19/2013 [facility] Patient Information 2015 [place], [place]. This information is not intended to replace advice given to you by your health care provider. Make sure you discuss any questions you have with your health care provider.

## 2023-07-04 ENCOUNTER — Encounter: Payer: Self-pay | Admitting: Women's Health

## 2023-07-04 LAB — INTEGRATED 1

## 2023-07-05 ENCOUNTER — Other Ambulatory Visit: Payer: PRIVATE HEALTH INSURANCE

## 2023-07-05 ENCOUNTER — Encounter: Payer: Self-pay | Admitting: Women's Health

## 2023-07-05 DIAGNOSIS — R7309 Other abnormal glucose: Secondary | ICD-10-CM

## 2023-07-05 DIAGNOSIS — Z131 Encounter for screening for diabetes mellitus: Secondary | ICD-10-CM

## 2023-07-05 LAB — HEMOGLOBIN A1C
Est. average glucose Bld gHb Est-mCnc: 117 mg/dL
Hgb A1c MFr Bld: 5.7 % — ABNORMAL HIGH (ref 4.8–5.6)

## 2023-07-05 LAB — COMPREHENSIVE METABOLIC PANEL
ALT: 13 [IU]/L (ref 0–32)
AST: 20 [IU]/L (ref 0–40)
Albumin: 4.3 g/dL (ref 3.9–4.9)
Alkaline Phosphatase: 57 [IU]/L (ref 44–121)
BUN/Creatinine Ratio: 20 (ref 9–23)
BUN: 14 mg/dL (ref 6–20)
Bilirubin Total: <0.2 mg/dL (ref 0.0–1.2)
CO2: 21 mmol/L (ref 20–29)
Calcium: 9.1 mg/dL (ref 8.7–10.2)
Chloride: 100 mmol/L (ref 96–106)
Creatinine, Ser: 0.71 mg/dL (ref 0.57–1.00)
Globulin, Total: 2.6 g/dL (ref 1.5–4.5)
Glucose: 56 mg/dL — ABNORMAL LOW (ref 70–99)
Potassium: 3.8 mmol/L (ref 3.5–5.2)
Sodium: 138 mmol/L (ref 134–144)
Total Protein: 6.9 g/dL (ref 6.0–8.5)
eGFR: 114 mL/min/{1.73_m2} (ref >59)

## 2023-07-05 LAB — CERVICOVAGINAL ANCILLARY ONLY
Bacterial Vaginitis (gardnerella): POSITIVE — AB
Candida Glabrata: NEGATIVE
Candida Vaginitis: POSITIVE — AB
Chlamydia: NEGATIVE
Comment: NEGATIVE
Comment: NEGATIVE
Comment: NEGATIVE
Comment: NEGATIVE
Comment: NEGATIVE
Comment: NORMAL
Neisseria Gonorrhea: NEGATIVE
Trichomonas: NEGATIVE

## 2023-07-05 LAB — CBC/D/PLT+RPR+RH+ABO+RUBIGG...
Antibody Screen: NEGATIVE
Basophils Absolute: 0 10*3/uL (ref 0.0–0.2)
Basos: 0 %
EOS (ABSOLUTE): 0 10*3/uL (ref 0.0–0.4)
Eos: 0 %
HCV Ab: NONREACTIVE
HIV Screen 4th Generation wRfx: NONREACTIVE
Hematocrit: 35.5 % (ref 34.0–46.6)
Hemoglobin: 11.5 g/dL (ref 11.1–15.9)
Hepatitis B Surface Ag: NEGATIVE
Immature Grans (Abs): 0 10*3/uL (ref 0.0–0.1)
Immature Granulocytes: 0 %
Lymphocytes Absolute: 1.5 10*3/uL (ref 0.7–3.1)
Lymphs: 18 %
MCH: 26.6 pg (ref 26.6–33.0)
MCHC: 32.4 g/dL (ref 31.5–35.7)
MCV: 82 fL (ref 79–97)
Monocytes Absolute: 0.6 10*3/uL (ref 0.1–0.9)
Monocytes: 7 %
Neutrophils Absolute: 6.1 10*3/uL (ref 1.4–7.0)
Neutrophils: 75 %
Platelets: 267 10*3/uL (ref 150–450)
RBC: 4.33 x10E6/uL (ref 3.77–5.28)
RDW: 13.7 % (ref 11.7–15.4)
RPR Ser Ql: NONREACTIVE
Rh Factor: POSITIVE
Rubella Antibodies, IGG: 16.3 {index} (ref >0.99)
WBC: 8.3 10*3/uL (ref 3.4–10.8)

## 2023-07-05 LAB — INTEGRATED 1
Crown Rump Length: 66.5 mm
Gest. Age on Collection Date: 12.7 wk
PAPP-A Value: 807.4 ng/mL
Race: 1
Sonographer ID#: 309760
Sonographer ID#: 35.1 a
Weight: 1.6 mm
Weight: 235 [lb_av]

## 2023-07-05 LAB — URINE CULTURE

## 2023-07-05 LAB — PROTEIN / CREATININE RATIO, URINE
Creatinine, Urine: 216.9 mg/dL
Protein, Ur: 17.4 mg/dL
Protein/Creat Ratio: 80 mg/g{creat} (ref 0–200)

## 2023-07-05 LAB — HCV INTERPRETATION

## 2023-07-05 MED ORDER — METRONIDAZOLE 500 MG PO TABS: 500.0000 mg | ORAL_TABLET | Freq: Two times a day (BID) | ORAL | 0 refills | Status: DC

## 2023-07-05 NOTE — Addendum Note (Signed)
Addended by: Cheral Marker on: 07/05/2023 03:40 PM   Modules accepted: Orders

## 2023-07-06 ENCOUNTER — Encounter: Payer: Self-pay | Admitting: Women's Health

## 2023-07-06 LAB — CYTOLOGY - PAP
Chlamydia: NEGATIVE
Comment: NEGATIVE
Comment: NEGATIVE
Comment: NORMAL
Diagnosis: NEGATIVE
High risk HPV: NEGATIVE
Neisseria Gonorrhea: NEGATIVE

## 2023-07-06 LAB — GLUCOSE TOLERANCE, 2 HOURS W/ 1HR
Glucose, 1 hour: 80 mg/dL (ref 70–179)
Glucose, 2 hour: 81 mg/dL (ref 70–152)
Glucose, Fasting: 80 mg/dL (ref 70–91)

## 2023-07-12 ENCOUNTER — Encounter: Payer: Self-pay | Admitting: Women's Health

## 2023-07-12 LAB — PANORAMA PRENATAL TEST FULL PANEL:PANORAMA TEST PLUS 5 ADDITIONAL MICRODELETIONS: FETAL FRACTION: 5.4

## 2023-07-31 ENCOUNTER — Ambulatory Visit (INDEPENDENT_AMBULATORY_CARE_PROVIDER_SITE_OTHER): Payer: PRIVATE HEALTH INSURANCE | Admitting: Women's Health

## 2023-07-31 ENCOUNTER — Encounter: Payer: Self-pay | Admitting: Women's Health

## 2023-07-31 VITALS — BP 115/81 | HR 84 | Wt 238.0 lb

## 2023-07-31 DIAGNOSIS — Z3480 Encounter for supervision of other normal pregnancy, unspecified trimester: Secondary | ICD-10-CM

## 2023-07-31 DIAGNOSIS — Z363 Encounter for antenatal screening for malformations: Secondary | ICD-10-CM

## 2023-07-31 DIAGNOSIS — Z1379 Encounter for other screening for genetic and chromosomal anomalies: Secondary | ICD-10-CM | POA: Diagnosis not present

## 2023-07-31 DIAGNOSIS — Z3A15 15 weeks gestation of pregnancy: Secondary | ICD-10-CM | POA: Diagnosis not present

## 2023-07-31 DIAGNOSIS — Z3482 Encounter for supervision of other normal pregnancy, second trimester: Secondary | ICD-10-CM

## 2023-07-31 NOTE — Patient Instructions (Signed)
 April Tyler, thank you for choosing our office today! We appreciate the opportunity to meet your healthcare needs. You may receive a short survey by mail, e-mail, or through [employer]. If you are happy with your care we would appreciate if you could take just a few minutes to complete the survey questions. We read all of your comments and take your feedback very seriously. Thank you again for choosing our office.  Center for [employer] Team at [facility] at [facility] ([address] [place], [place] [address]) Entrance C, located off of [address] Free 24/7 valet parking  Go to [email or web address] to register for FREE online childbirth classes  Call the office [phone]) or go to [facility] if: You begin to severe cramping Your water breaks.  Sometimes it is a big gush of fluid, sometimes it is just a trickle that keeps getting your panties wet or running down your legs You have vaginal bleeding.  It is normal to have a small amount of spotting if your cervix was checked.   [facility] Pediatricians/Family Doctors [facility]): [address] Colette Ribas, [phone]           [facility]: [address], [phone]                [facility] Medicine [facility]): [address], [phone] (call to ask if accepting patients) [facility] Department: [address], [place], [phone]    [facility] Pediatricians/Family Doctors [facility]): [phone] [address], [phone] [facility]: [address] [place], [phone] [facility]: [address], [phone]  [facility] Doctors  [facility]): [phone] [facility]: [address], [phone]   [facility] Doctors [facility]: [address], [phone]  [facility] Doctors  [facility]: [phone], [phone]  Home Blood Pressure Monitoring for Patients   Your provider has recommended that you check your blood pressure (BP) at least once a week at home. If you do not have a blood pressure cuff at home, one will be provided for you. Contact your provider if you have not received your monitor within 1 week.   Helpful Tips for Accurate Home Blood Pressure Checks  Don't smoke, exercise, or drink caffeine 30 minutes before checking your BP Use the restroom before checking your BP (a full bladder can raise your pressure) Relax in a comfortable upright chair Feet on the ground Left arm resting comfortably on a flat surface at the level of your heart Legs uncrossed Back supported Sit quietly and don't talk Place the cuff on your bare arm Adjust snuggly, so that only two fingertips can fit between your skin and the top of the cuff Check 2 readings separated by at least one minute Keep a log of your BP readings For a visual, please reference this diagram: http://ccnc.care/bpdiagram  Provider Name: [facility]     Phone: [phone]  Zone 1: ALL CLEAR  Continue to monitor your symptoms:  BP reading is less than 140 (top number) or less than 90 (bottom number)  No right upper stomach pain No headaches or seeing spots No feeling nauseated or throwing up No swelling in face and hands  Zone 2: CAUTION Call your doctor's office for any of the following:  BP reading is greater than 140 (top number) or greater than  90 (bottom number)  Stomach pain under your ribs in the middle or right side Headaches or seeing spots Feeling nauseated or throwing up Swelling in face and hands  Zone 3: EMERGENCY  Seek immediate medical care if you have any of the following:  BP reading is greater than160 (top number) or greater than 110 (bottom number) Severe headaches not improving with Tylenol Serious difficulty catching your breath Any worsening symptoms from  Zone 2     Second Trimester of Pregnancy The second trimester is from week 14 through week 27 (months 4 through 6). The second trimester is often a time when you feel your best. Your body has adjusted to being pregnant, and you begin to feel better physically. Usually, morning sickness has lessened or quit completely, you may have more energy, and you may have an increase in appetite. The second trimester is also a time when the fetus is growing rapidly. At the end of the sixth month, the fetus is about 9 inches long and weighs about 1 pounds. You will likely begin to feel the baby move (quickening) between 16 and 20 weeks of pregnancy. Body changes during your second trimester Your body continues to go through many changes during your second trimester. The changes vary from woman to woman. Your weight will continue to increase. You will notice your lower abdomen bulging out. You may begin to get stretch marks on your hips, abdomen, and breasts. You may develop headaches that can be relieved by medicines. The medicines should be approved by your health care provider. You may urinate more often because the fetus is pressing on your bladder. You may develop or continue to have heartburn as a result of your pregnancy. You may develop constipation because certain hormones are causing the muscles that push waste through your intestines to slow down. You may develop hemorrhoids or swollen, bulging veins (varicose veins). You may have back pain. This is caused by: Weight gain. Pregnancy hormones that are relaxing the joints in your pelvis. A shift in weight and the muscles that support your balance. Your breasts will continue to grow and they will continue to become tender. Your gums may bleed and may be sensitive to brushing and flossing. Dark spots or blotches (chloasma, mask of pregnancy) may develop on your face. This will likely fade after the baby is born. A dark line from your belly button to  the pubic area (linea nigra) may appear. This will likely fade after the baby is born. You may have changes in your hair. These can include thickening of your hair, rapid growth, and changes in texture. Some women also have hair loss during or after pregnancy, or hair that feels dry or thin. Your hair will most likely return to normal after your baby is born.  What to expect at prenatal visits During a routine prenatal visit: You will be weighed to make sure you and the fetus are growing normally. Your blood pressure will be taken. Your abdomen will be measured to track your baby's growth. The fetal heartbeat will be listened to. Any test results from the previous visit will be discussed.  Your health care provider may ask you: How you are feeling. If you are feeling the baby move. If you have had any abnormal symptoms, such as leaking fluid, bleeding, severe headaches, or abdominal cramping. If you are using any tobacco products, including cigarettes, chewing tobacco, and electronic cigarettes. If you have any questions.  Other tests that may be performed during  your second trimester include: Blood tests that check for: Low iron levels (anemia). High blood sugar that affects pregnant women (gestational diabetes) between 86 and 28 weeks. Rh antibodies. This is to check for a protein on red blood cells (Rh factor). Urine tests to check for infections, diabetes, or protein in the urine. An ultrasound to confirm the proper growth and development of the baby. An amniocentesis to check for possible genetic problems. Fetal screens for spina bifida and Down syndrome. HIV (human immunodeficiency virus) testing. Routine prenatal testing includes screening for HIV, unless you choose not to have this test.  Follow these instructions at home: Medicines Follow your health care provider's instructions regarding medicine use. Specific medicines may be either safe or unsafe to take during  pregnancy. Take a prenatal vitamin that contains at least 600 micrograms (mcg) of folic acid. If you develop constipation, try taking a stool softener if your health care provider approves. Eating and drinking Eat a balanced diet that includes fresh fruits and vegetables, whole grains, good sources of protein such as meat, eggs, or tofu, and low-fat dairy. Your health care provider will help you determine the amount of weight gain that is right for you. Avoid raw meat and uncooked cheese. These carry germs that can cause birth defects in the baby. If you have low calcium intake from food, talk to your health care provider about whether you should take a daily calcium supplement. Limit foods that are high in fat and processed sugars, such as fried and sweet foods. To prevent constipation: Drink enough fluid to keep your urine clear or pale yellow. Eat foods that are high in fiber, such as fresh fruits and vegetables, whole grains, and beans. Activity Exercise only as directed by your health care provider. Most women can continue their usual exercise routine during pregnancy. Try to exercise for 30 minutes at least 5 days a week. Stop exercising if you experience uterine contractions. Avoid heavy lifting, wear low heel shoes, and practice good posture. A sexual relationship may be continued unless your health care provider directs you otherwise. Relieving pain and discomfort Wear a good support bra to prevent discomfort from breast tenderness. Take warm sitz baths to soothe any pain or discomfort caused by hemorrhoids. Use hemorrhoid cream if your health care provider approves. Rest with your legs elevated if you have leg cramps or low back pain. If you develop varicose veins, wear support hose. Elevate your feet for 15 minutes, 3-4 times a day. Limit salt in your diet. Prenatal Care Write down your questions. Take them to your prenatal visits. Keep all your prenatal visits as told by your health  care provider. This is important. Safety Wear your seat belt at all times when driving. Make a list of emergency phone numbers, including numbers for family, friends, the hospital, and police and fire departments. General instructions Ask your health care provider for a referral to a local prenatal education class. Begin classes no later than the beginning of month 6 of your pregnancy. Ask for help if you have counseling or nutritional needs during pregnancy. Your health care provider can offer advice or refer you to specialists for help with various needs. Do not use hot tubs, steam rooms, or saunas. Do not douche or use tampons or scented sanitary pads. Do not cross your legs for long periods of time. Avoid cat litter boxes and soil used by cats. These carry germs that can cause birth defects in the baby and possibly loss of the  fetus by miscarriage or stillbirth. Avoid all smoking, herbs, alcohol, and unprescribed drugs. Chemicals in these products can affect the formation and growth of the baby. Do not use any products that contain nicotine or tobacco, such as cigarettes and e-cigarettes. If you need help quitting, ask your health care provider. Visit your dentist if you have not gone yet during your pregnancy. Use a soft toothbrush to brush your teeth and be gentle when you floss. Contact a health care provider if: You have dizziness. You have mild pelvic cramps, pelvic pressure, or nagging pain in the abdominal area. You have persistent nausea, vomiting, or diarrhea. You have a bad smelling vaginal discharge. You have pain when you urinate. Get help right away if: You have a fever. You are leaking fluid from your vagina. You have spotting or bleeding from your vagina. You have severe abdominal cramping or pain. You have rapid weight gain or weight loss. You have shortness of breath with chest pain. You notice sudden or extreme swelling of your face, hands, ankles, feet, or legs. You  have not felt your baby move in over an hour. You have severe headaches that do not go away when you take medicine. You have vision changes. Summary The second trimester is from week 14 through week 27 (months 4 through 6). It is also a time when the fetus is growing rapidly. Your body goes through many changes during pregnancy. The changes vary from woman to woman. Avoid all smoking, herbs, alcohol, and unprescribed drugs. These chemicals affect the formation and growth your baby. Do not use any tobacco products, such as cigarettes, chewing tobacco, and e-cigarettes. If you need help quitting, ask your health care provider. Contact your health care provider if you have any questions. Keep all prenatal visits as told by your health care provider. This is important. This information is not intended to replace advice given to you by your health care provider. Make sure you discuss any questions you have with your health care provider. Document Released: 05/03/2001 Document Revised: 10/15/2015 Document Reviewed: 07/10/2012 Elsevier Interactive Patient Education  2017 [employer].

## 2023-07-31 NOTE — Progress Notes (Signed)
 LOW-RISK PREGNANCY VISIT Patient name: April Tyler MRN [id number]  Date of birth: 1991-02-03 Chief Complaint:   Routine Prenatal Visit (2nd IT)  History of Present Illness:   April Tyler is a 33 y.o. [phone] female at [redacted]w[redacted]d with an Estimated Date of Delivery: 01/16/24 being seen today for ongoing management of a low-risk pregnancy.   Today she reports no complaints. Contractions: Not present.  .  Movement: Absent. denies leaking of fluid.     07/03/2023    3:04 PM 05/23/2023    3:36 PM 09/23/2021    3:10 PM 05/27/2020    4:33 PM 04/10/2017    2:40 PM  Depression screen PHQ 2/9  Decreased Interest 0 1 0 1 0  Down, Depressed, Hopeless 0 0 0 1 0  PHQ - 2 Score 0 1 0 2 0  Altered sleeping 0 2 0 0 2  Tired, decreased energy 0 1 2 3  0  Change in appetite 0 2 0 1 0  Feeling bad or failure about yourself  0 0 0 0 0  Trouble concentrating 0 1 0 1   Moving slowly or fidgety/restless 0 0 0 0 0  Suicidal thoughts 0 0 0 0 0  PHQ-9 Score 0 7 2 7 2   Difficult doing work/chores   Not difficult at all Not difficult at all Not difficult at all        07/03/2023    3:04 PM 05/23/2023    3:36 PM 09/23/2021    3:22 PM  GAD 7 : Generalized Anxiety Score  Nervous, Anxious, on Edge 0 1 2  Control/stop worrying 0 0 3  Worry too much - different things 0 1 3  Trouble relaxing 0 0 0  Restless 0 0 0  Easily annoyed or irritable 0 0 2  Afraid - awful might happen 0 0 2  Total GAD 7 Score 0 2 12      Review of Systems:   Pertinent items are noted in HPI Denies abnormal vaginal discharge w/ itching/odor/irritation, headaches, visual changes, shortness of breath, chest pain, abdominal pain, severe nausea/vomiting, or problems with urination or bowel movements unless otherwise stated above. Pertinent History Reviewed:  Reviewed past medical,surgical, social, obstetrical and family history.  Reviewed problem list, medications and allergies. Physical Assessment:   Vitals:   07/31/23 1212   BP: 115/81  Pulse: 84  Weight: 238 lb (108 kg)  Body mass index is 36.19 kg/m.        Physical Examination:   General appearance: Well appearing, and in no distress  Mental status: Alert, oriented to person, place, and time  Skin: Warm & dry  Cardiovascular: Normal heart rate noted  Respiratory: Normal respiratory effort, no distress  Abdomen: Soft, gravid, nontender  Pelvic: Cervical exam deferred         Extremities: Edema: None  Fetal Status: Fetal Heart Rate (bpm): 148   Movement: Absent    Chaperone: N/A No results found for this or any previous visit (from the past 24 hours).  Assessment & Plan:  1) Low-risk pregnancy [id number] at [redacted]w[redacted]d with an Estimated Date of Delivery: 01/16/24   2) H/O PPHTN, ASA   Meds: No orders of the defined types were placed in this encounter.  Labs/procedures today: 2nd IT  Plan:  Continue routine obstetrical care  Next visit: prefers will be in person for u/s     Reviewed: Preterm labor symptoms and general obstetric precautions including but not limited to vaginal bleeding,  contractions, leaking of fluid and fetal movement were reviewed in detail with the patient.  All questions were answered. Does have home bp cuff. Office bp cuff given: not applicable. Check bp weekly, let us know if consistently >140 and/or >90.  Follow-up: Return for As scheduled.  Future Appointments  Date Time Provider Department Center  08/28/2023  3:00 PM [facility] 2 [facility] None  08/28/2023  4:10 PM Cheral Marker, CNM [facility]    Orders Placed This Encounter  Procedures   US OB Comp + 14 Wk   INTEGRATED 2   Cheral Marker CNM, [facility] 07/31/2023 12:29 PM

## 2023-08-04 LAB — INTEGRATED 2
AFP MoM: 1.79
Alpha-Fetoprotein: 47 ng/mL
Crown Rump Length: 66.5 mm
DIA MoM: 1.43
DIA Value: 175.1 pg/mL
Estriol, Unconjugated: 0.54 ng/mL
Gest. Age on Collection Date: 12.7 wk
Gestational Age: 16.7 wk
Maternal Age at EDD: 35.1 a
Nuchal Translucency (NT): 1.6 mm
Nuchal Translucency MoM: 0.86
Number of Fetuses: 1
PAPP-A MoM: 1.31
PAPP-A Value: 807.4 ng/mL
Sonographer ID#: 309760
Test Results:: NEGATIVE
Weight: 235 [lb_av]
Weight: 235 [lb_av]
hCG MoM: 3.62
hCG Value: 83.9 [IU]/mL
uE3 MoM: 0.64

## 2023-08-28 ENCOUNTER — Ambulatory Visit: Payer: PRIVATE HEALTH INSURANCE | Admitting: Advanced Practice Midwife

## 2023-08-28 ENCOUNTER — Encounter: Payer: Self-pay | Admitting: Advanced Practice Midwife

## 2023-08-28 ENCOUNTER — Ambulatory Visit: Payer: PRIVATE HEALTH INSURANCE | Admitting: Radiology

## 2023-08-28 VITALS — BP 124/77 | HR 79 | Wt 250.0 lb

## 2023-08-28 DIAGNOSIS — Z363 Encounter for antenatal screening for malformations: Secondary | ICD-10-CM | POA: Diagnosis not present

## 2023-08-28 DIAGNOSIS — Z362 Encounter for other antenatal screening follow-up: Secondary | ICD-10-CM

## 2023-08-28 DIAGNOSIS — Z3482 Encounter for supervision of other normal pregnancy, second trimester: Secondary | ICD-10-CM

## 2023-08-28 DIAGNOSIS — Z3A19 19 weeks gestation of pregnancy: Secondary | ICD-10-CM

## 2023-08-28 DIAGNOSIS — Z3480 Encounter for supervision of other normal pregnancy, unspecified trimester: Secondary | ICD-10-CM

## 2023-08-28 NOTE — Progress Notes (Signed)
 [place]: GA = 19+6 Single active female fetus, breech, FHR = 143 bpm, posterior pl, gr1, MVP = 4.2 cm Cardiac screen incomplete due to fetal position, breech prone position, need all cardiac structures.  EFW 75%, 352g, nl ov's, CL = 5.5 cm,

## 2023-08-28 NOTE — Patient Instructions (Signed)
 April Tyler, thank you for choosing our office today! We appreciate the opportunity to meet your healthcare needs. You may receive a short survey by mail, e-mail, or through [employer]. If you are happy with your care we would appreciate if you could take just a few minutes to complete the survey questions. We read all of your comments and take your feedback very seriously. Thank you again for choosing our office.  Center for [employer] Team at [facility] at [facility] ([address] [place], [place] [address]) Entrance C, located off of [address] Free 24/7 valet parking  Go to [email or web address] to register for FREE online childbirth classes  Call the office [phone]) or go to [facility] if: You begin to severe cramping Your water breaks.  Sometimes it is a big gush of fluid, sometimes it is just a trickle that keeps getting your panties wet or running down your legs You have vaginal bleeding.  It is normal to have a small amount of spotting if your cervix was checked.   [facility] Pediatricians/Family Doctors [facility]): [address] Colette Ribas, [phone]           [facility]: [address], [phone]                [facility] Medicine [facility]): [address], [phone] (call to ask if accepting patients) [facility] Department: [address], [place], [phone]    [facility] Pediatricians/Family Doctors [facility]): [phone] [address], [phone] [facility]: [address] [place], [phone] [facility]: [address], [phone]  [facility] Doctors  [facility]): [phone] [facility]: [address], [phone]   [facility] Doctors [facility]: [address], [phone]  [facility] Doctors  [facility]: [phone], [phone]  Home Blood Pressure Monitoring for Patients   Your provider has recommended that you check your blood pressure (BP) at least once a week at home. If you do not have a blood pressure cuff at home, one will be provided for you. Contact your provider if you have not received your monitor within 1 week.   Helpful Tips for Accurate Home Blood Pressure Checks  Don't smoke, exercise, or drink caffeine 30 minutes before checking your BP Use the restroom before checking your BP (a full bladder can raise your pressure) Relax in a comfortable upright chair Feet on the ground Left arm resting comfortably on a flat surface at the level of your heart Legs uncrossed Back supported Sit quietly and don't talk Place the cuff on your bare arm Adjust snuggly, so that only two fingertips can fit between your skin and the top of the cuff Check 2 readings separated by at least one minute Keep a log of your BP readings For a visual, please reference this diagram: http://ccnc.care/bpdiagram  Provider Name: [facility]     Phone: [phone]  Zone 1: ALL CLEAR  Continue to monitor your symptoms:  BP reading is less than 140 (top number) or less than 90 (bottom number)  No right upper stomach pain No headaches or seeing spots No feeling nauseated or throwing up No swelling in face and hands  Zone 2: CAUTION Call your doctor's office for any of the following:  BP reading is greater than 140 (top number) or greater than  90 (bottom number)  Stomach pain under your ribs in the middle or right side Headaches or seeing spots Feeling nauseated or throwing up Swelling in face and hands  Zone 3: EMERGENCY  Seek immediate medical care if you have any of the following:  BP reading is greater than160 (top number) or greater than 110 (bottom number) Severe headaches not improving with Tylenol Serious difficulty catching your breath Any worsening symptoms from  Zone 2     Second Trimester of Pregnancy The second trimester is from week 14 through week 27 (months 4 through 6). The second trimester is often a time when you feel your best. Your body has adjusted to being pregnant, and you begin to feel better physically. Usually, morning sickness has lessened or quit completely, you may have more energy, and you may have an increase in appetite. The second trimester is also a time when the fetus is growing rapidly. At the end of the sixth month, the fetus is about 9 inches long and weighs about 1 pounds. You will likely begin to feel the baby move (quickening) between 16 and 20 weeks of pregnancy. Body changes during your second trimester Your body continues to go through many changes during your second trimester. The changes vary from woman to woman. Your weight will continue to increase. You will notice your lower abdomen bulging out. You may begin to get stretch marks on your hips, abdomen, and breasts. You may develop headaches that can be relieved by medicines. The medicines should be approved by your health care provider. You may urinate more often because the fetus is pressing on your bladder. You may develop or continue to have heartburn as a result of your pregnancy. You may develop constipation because certain hormones are causing the muscles that push waste through your intestines to slow down. You may develop hemorrhoids or swollen, bulging veins (varicose veins). You may have back pain. This is caused by: Weight gain. Pregnancy hormones that are relaxing the joints in your pelvis. A shift in weight and the muscles that support your balance. Your breasts will continue to grow and they will continue to become tender. Your gums may bleed and may be sensitive to brushing and flossing. Dark spots or blotches (chloasma, mask of pregnancy) may develop on your face. This will likely fade after the baby is born. A dark line from your belly button to  the pubic area (linea nigra) may appear. This will likely fade after the baby is born. You may have changes in your hair. These can include thickening of your hair, rapid growth, and changes in texture. Some women also have hair loss during or after pregnancy, or hair that feels dry or thin. Your hair will most likely return to normal after your baby is born.  What to expect at prenatal visits During a routine prenatal visit: You will be weighed to make sure you and the fetus are growing normally. Your blood pressure will be taken. Your abdomen will be measured to track your baby's growth. The fetal heartbeat will be listened to. Any test results from the previous visit will be discussed.  Your health care provider may ask you: How you are feeling. If you are feeling the baby move. If you have had any abnormal symptoms, such as leaking fluid, bleeding, severe headaches, or abdominal cramping. If you are using any tobacco products, including cigarettes, chewing tobacco, and electronic cigarettes. If you have any questions.  Other tests that may be performed during  your second trimester include: Blood tests that check for: Low iron levels (anemia). High blood sugar that affects pregnant women (gestational diabetes) between 86 and 28 weeks. Rh antibodies. This is to check for a protein on red blood cells (Rh factor). Urine tests to check for infections, diabetes, or protein in the urine. An ultrasound to confirm the proper growth and development of the baby. An amniocentesis to check for possible genetic problems. Fetal screens for spina bifida and Down syndrome. HIV (human immunodeficiency virus) testing. Routine prenatal testing includes screening for HIV, unless you choose not to have this test.  Follow these instructions at home: Medicines Follow your health care provider's instructions regarding medicine use. Specific medicines may be either safe or unsafe to take during  pregnancy. Take a prenatal vitamin that contains at least 600 micrograms (mcg) of folic acid. If you develop constipation, try taking a stool softener if your health care provider approves. Eating and drinking Eat a balanced diet that includes fresh fruits and vegetables, whole grains, good sources of protein such as meat, eggs, or tofu, and low-fat dairy. Your health care provider will help you determine the amount of weight gain that is right for you. Avoid raw meat and uncooked cheese. These carry germs that can cause birth defects in the baby. If you have low calcium intake from food, talk to your health care provider about whether you should take a daily calcium supplement. Limit foods that are high in fat and processed sugars, such as fried and sweet foods. To prevent constipation: Drink enough fluid to keep your urine clear or pale yellow. Eat foods that are high in fiber, such as fresh fruits and vegetables, whole grains, and beans. Activity Exercise only as directed by your health care provider. Most women can continue their usual exercise routine during pregnancy. Try to exercise for 30 minutes at least 5 days a week. Stop exercising if you experience uterine contractions. Avoid heavy lifting, wear low heel shoes, and practice good posture. A sexual relationship may be continued unless your health care provider directs you otherwise. Relieving pain and discomfort Wear a good support bra to prevent discomfort from breast tenderness. Take warm sitz baths to soothe any pain or discomfort caused by hemorrhoids. Use hemorrhoid cream if your health care provider approves. Rest with your legs elevated if you have leg cramps or low back pain. If you develop varicose veins, wear support hose. Elevate your feet for 15 minutes, 3-4 times a day. Limit salt in your diet. Prenatal Care Write down your questions. Take them to your prenatal visits. Keep all your prenatal visits as told by your health  care provider. This is important. Safety Wear your seat belt at all times when driving. Make a list of emergency phone numbers, including numbers for family, friends, the hospital, and police and fire departments. General instructions Ask your health care provider for a referral to a local prenatal education class. Begin classes no later than the beginning of month 6 of your pregnancy. Ask for help if you have counseling or nutritional needs during pregnancy. Your health care provider can offer advice or refer you to specialists for help with various needs. Do not use hot tubs, steam rooms, or saunas. Do not douche or use tampons or scented sanitary pads. Do not cross your legs for long periods of time. Avoid cat litter boxes and soil used by cats. These carry germs that can cause birth defects in the baby and possibly loss of the  fetus by miscarriage or stillbirth. Avoid all smoking, herbs, alcohol, and unprescribed drugs. Chemicals in these products can affect the formation and growth of the baby. Do not use any products that contain nicotine or tobacco, such as cigarettes and e-cigarettes. If you need help quitting, ask your health care provider. Visit your dentist if you have not gone yet during your pregnancy. Use a soft toothbrush to brush your teeth and be gentle when you floss. Contact a health care provider if: You have dizziness. You have mild pelvic cramps, pelvic pressure, or nagging pain in the abdominal area. You have persistent nausea, vomiting, or diarrhea. You have a bad smelling vaginal discharge. You have pain when you urinate. Get help right away if: You have a fever. You are leaking fluid from your vagina. You have spotting or bleeding from your vagina. You have severe abdominal cramping or pain. You have rapid weight gain or weight loss. You have shortness of breath with chest pain. You notice sudden or extreme swelling of your face, hands, ankles, feet, or legs. You  have not felt your baby move in over an hour. You have severe headaches that do not go away when you take medicine. You have vision changes. Summary The second trimester is from week 14 through week 27 (months 4 through 6). It is also a time when the fetus is growing rapidly. Your body goes through many changes during pregnancy. The changes vary from woman to woman. Avoid all smoking, herbs, alcohol, and unprescribed drugs. These chemicals affect the formation and growth your baby. Do not use any tobacco products, such as cigarettes, chewing tobacco, and e-cigarettes. If you need help quitting, ask your health care provider. Contact your health care provider if you have any questions. Keep all prenatal visits as told by your health care provider. This is important. This information is not intended to replace advice given to you by your health care provider. Make sure you discuss any questions you have with your health care provider. Document Released: 05/03/2001 Document Revised: 10/15/2015 Document Reviewed: 07/10/2012 Elsevier Interactive Patient Education  2017 [employer].

## 2023-08-28 NOTE — Progress Notes (Signed)
   LOW-RISK PREGNANCY VISIT Patient name: April Tyler MRN [id number]  Date of birth: [birth year] Chief Complaint:   Routine Prenatal Visit (Anatomy scan)  History of Present Illness:   April Tyler is a 33 y.o. [id number] female at [redacted]w[redacted]d with an Estimated Date of Delivery: 01/16/24 being seen today for ongoing management of a low-risk pregnancy.  Today she reports no complaints. Contractions: Not present. Vag. Bleeding: None.  Movement: Present. denies leaking of fluid. Review of Systems:   Pertinent items are noted in HPI Denies abnormal vaginal discharge w/ itching/odor/irritation, headaches, visual changes, shortness of breath, chest pain, abdominal pain, severe nausea/vomiting, or problems with urination or bowel movements unless otherwise stated above. Pertinent History Reviewed:  Reviewed past medical,surgical, social, obstetrical and family history.  Reviewed problem list, medications and allergies. Physical Assessment:   Vitals:   08/28/23 1602  BP: 124/77  Pulse: 79  Weight: 250 lb (113.4 kg)  Body mass index is 38.01 kg/m.        Physical Examination:   General appearance: Well appearing, and in no distress  Mental status: Alert, oriented to person, place, and time  Skin: Warm & dry  Cardiovascular: Normal heart rate noted  Respiratory: Normal respiratory effort, no distress  Abdomen: Soft, gravid, nontender  Pelvic: Cervical exam deferred         Extremities: Edema: None  Fetal Status: Fetal Heart Rate (bpm): 143 u/s   Movement: Present    Anatomy u/s: [place]: GA = 19+6 Single active female fetus, breech, FHR = 143 bpm, posterior pl, gr1, MVP = 4.2 cm Cardiac screen incomplete due to fetal position, breech prone position, need all cardiac structures.  EFW 75%, 352g, nl ov's, CL = 5.5 cm,    No results found for this or any previous visit (from the past 24 hours).  Assessment & Plan:  1) Low-risk pregnancy [id number] at [redacted]w[redacted]d with an Estimated Date of Delivery: 01/16/24    2) Incomplete cardiac anatomy, will get f/u u/s at next visit   Meds: No orders of the defined types were placed in this encounter.  Labs/procedures today: Anatomy u/s  Plan:  Continue routine obstetrical care   Reviewed: Preterm labor symptoms and general obstetric precautions including but not limited to vaginal bleeding, contractions, leaking of fluid and fetal movement were reviewed in detail with the patient.  All questions were answered. Has home bp cuff. Check bp weekly, let us know if >140/90.   Follow-up: Return in about 4 weeks (around 09/25/2023) for LROB, in person & f/u to complete anatomy u/s.  Orders Placed This Encounter  Procedures   US OB Follow Up   Arabella Merles [facility] 08/28/2023 4:09 PM

## 2023-09-04 ENCOUNTER — Ambulatory Visit
Admission: EM | Admit: 2023-09-04 | Discharge: 2023-09-04 | Disposition: A | Discharge status: Home / Self Care | Payer: PRIVATE HEALTH INSURANCE | Attending: Family Medicine | Admitting: Family Medicine

## 2023-09-04 DIAGNOSIS — W19XXXA Unspecified fall, initial encounter: Secondary | ICD-10-CM

## 2023-09-04 DIAGNOSIS — M79672 Pain in left foot: Secondary | ICD-10-CM | POA: Diagnosis not present

## 2023-09-04 NOTE — Discharge Instructions (Signed)
 Wear the postop shoe to help with pain when walking until pain improves.  Ice, elevate, Tylenol as needed.  Follow-up for worsening or unresolving symptoms.  We will forego an x-ray today given low suspicion for fracture and your current pregnancy.

## 2023-09-04 NOTE — ED Provider Notes (Signed)
 [facility]    CSN: [id number] Arrival date & time: 09/04/23  1322      History   Chief Complaint No chief complaint on file.   HPI April Tyler is a 33 y.o. female.   Patient presenting today with 1 day history of left foot and toe pain after a fall this morning where her flip flop got caught on the stairs.  Denies numbness, tingling, loss of range of motion, inability to bear weight, skin injury.  So far not trying anything over-the-counter for symptoms.  Currently [redacted] weeks pregnant.   Past Medical History:  Diagnosis Date   BV (bacterial vaginosis) 01/31/2014   Exposure to chlamydia 03/13/2015   GBS bacteriuria 05/11/2017   tx 05/11/17   POC: neg   Tx in labor   History of elective abortion 11/29/2013   History of postpartum hypertension 04/10/2017   Requiring meds       Baby ASA started @ 19wks (entry to care)   Induced abortion 08/21/2015   Miscarriage 12/22/2015   SAB July 2017   Supervision of normal pregnancy 04/10/2017    [facility]  Initiated Care at  19wk  FOB Darius Jones  Dating By LMP c/w 2nd trimester U/S 15wk  Pap 04/10/17 neg  GC/CT Initial:  -/-              36+wks:-/-  Genetic Screen declined  CF screen Declined   Anatomic [place] Normal female  Flu vaccine Declined 04/10/17   Tdap Recommended ~ 28wks  Glucose Screen  2 hr80/111/87  GBS Pos (urine)  Feed Preference bottle  Contraception BTL, consent:    UTI (lower urinary tract infection)    Vitamin D deficiency     Patient Active Problem List   Diagnosis Date Noted   History of postpartum hypertension 07/03/2023   Encounter for supervision of normal pregnancy, antepartum 06/30/2023   Hyperlipidemia 10/13/2020   Anxiety 10/13/2020   Vitamin D deficiency 05/28/2020   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 Authorizing Provider  acetaminophen (TYLENOL) 500 MG tablet Take 500 mg by mouth every 6 (six) hours as needed for mild pain or headache.    [provider]  aspirin EC 81 MG tablet Take 2 tablets (162 mg total) by mouth daily. Swallow whole. 07/03/23   Cheral Marker, CNM  Doxylamine-Pyridoxine (DICLEGIS) 10-10 MG TBEC 2 tabs q hs, if sx persist add 1 tab q am on day 3, if sx persist add 1 tab q afternoon on day 4 07/03/23   Cheral Marker, CNM  ondansetron (ZOFRAN) 4 MG tablet Take 1 tablet (4 mg total) by mouth every 8 (eight) hours as needed. 05/30/23   Adline Potter, NP  Prenatal Vit-Fe Fumarate-FA (PRENATAL VITAMIN PO) Take by mouth.    [provider]  valACYclovir (VALTREX) 500 MG tablet TAKE 1 TABLET(500 MG) BY MOUTH TWICE DAILY 04/08/22   Cyril Mourning A, NP  omeprazole (PRILOSEC) 20 MG capsule Take 1 capsule (20 mg total) by mouth daily. 05/27/20 08/13/20  [name], NP  sertraline (ZOLOFT) 25 MG tablet Take 1 tablet (25 mg total) by mouth daily. 05/27/20 08/13/20  [name], NP    Family History Family History  Problem Relation Age of Onset   Other Father        B 12 Deficiency   Asthma Daughter    Hypertension Maternal Grandmother    Obesity Paternal Grandmother    Other Sister        B 12 Defficiency   Hypertension Maternal Aunt     Social History Social History   Tobacco Use   Smoking status: Never   Smokeless tobacco: Never  Vaping Use   Vaping status: Never Used  Substance Use Topics   Alcohol use: No    Comment: occ; not now   Drug use: No     Allergies   Patient has no known allergies.   Review of Systems Review of Systems PER HPI  Physical Exam Triage Vital Signs ED Triage Vitals  Encounter Vitals Group     BP  09/04/23 1419 106/67     Systolic BP Percentile --      Diastolic BP Percentile --      Pulse Rate 09/04/23 1419 82     Resp 09/04/23 1419 18     Temp 09/04/23 1419 98 F (36.7 C)     Temp Source 09/04/23 1419 Oral     SpO2 09/04/23 1419 100 %     Weight --      Height --      Head Circumference --      Peak Flow --      Pain Score 09/04/23 1422 5     Pain Loc --      Pain Education --      Exclude from Growth Chart --    No data found.  Updated Vital Signs BP 106/67 (BP Location: Right Arm)   Pulse 82   Temp 98 F (36.7 C) (Oral)   Resp 18   LMP 04/11/2023   SpO2 100%   Visual Acuity Right Eye Distance:   Left Eye Distance:   Bilateral Distance:    Right Eye Near:   Left Eye Near:    Bilateral Near:     Physical Exam Vitals and nursing note reviewed.  Constitutional:      Appearance: Normal appearance. She is not ill-appearing.  HENT:     Head: Atraumatic.  Eyes:     Extraocular Movements: Extraocular movements intact.     Conjunctiva/sclera: Conjunctivae normal.  Cardiovascular:     Rate and Rhythm: Normal rate.  Pulmonary:     Effort: Pulmonary effort is normal.  Musculoskeletal:        General: Swelling, tenderness and signs of injury present. No deformity. Normal range of motion.     Cervical back: Normal range of motion and neck supple.     Comments: Mild tenderness to palpation diffusely over left 4th and 5th toes and dorsal left mid foot  Skin:    General: Skin is warm and dry.     Comments: Slight bruising present to the left 4th and 5th toe dorsally  Neurological:     Mental Status: She is alert and oriented to person, place, and time.  Comments: Left lower extremity neurovascularly intact  Psychiatric:        Mood and Affect: Mood normal.        Thought Content: Thought content normal.        Judgment: Judgment normal.     UC Treatments / Results  Labs (all labs ordered are listed, but only abnormal results are displayed) Labs  Reviewed - No data to display  EKG   Radiology No results found.  Procedures Procedures (including critical care time)  Medications Ordered in UC Medications - No data to display  Initial Impression / Assessment and Plan / UC Course  I have reviewed the triage vital signs and the nursing notes.  Pertinent labs & imaging results that were available during my care of the patient were reviewed by me and considered in my medical decision making (see chart for details).     Exam very reassuring today, low suspicion for fracture and given her current pregnancy do not feel that benefit outweighs risk.  Patient agreeable to foregoing x-ray at this time, will treat with postop shoe for comfort, RICE protocol, Tylenol as needed.  Return for worsening symptoms.  Work note given.  Final Clinical Impressions(s) / UC Diagnoses   Final diagnoses:  Left foot pain  Fall, initial encounter     Discharge Instructions      Wear the postop shoe to help with pain when walking until pain improves.  Ice, elevate, Tylenol as needed.  Follow-up for worsening or unresolving symptoms.  We will forego an x-ray today given low suspicion for fracture and your current pregnancy.    ED Prescriptions   None    PDMP not reviewed this encounter.   Corbin Dess, [place] 09/04/23 1547

## 2023-09-04 NOTE — ED Triage Notes (Signed)
 Pt reports fall this morning and injured left foot, pain is the worse scross the toes.

## 2023-09-26 ENCOUNTER — Ambulatory Visit: Payer: PRIVATE HEALTH INSURANCE | Admitting: Obstetrics and Gynecology

## 2023-09-26 ENCOUNTER — Ambulatory Visit: Payer: PRIVATE HEALTH INSURANCE | Admitting: Radiology

## 2023-09-26 VITALS — BP 114/72 | HR 90 | Wt 260.0 lb

## 2023-09-26 DIAGNOSIS — O26892 Other specified pregnancy related conditions, second trimester: Secondary | ICD-10-CM | POA: Diagnosis not present

## 2023-09-26 DIAGNOSIS — Z3A24 24 weeks gestation of pregnancy: Secondary | ICD-10-CM

## 2023-09-26 DIAGNOSIS — Z8679 Personal history of other diseases of the circulatory system: Secondary | ICD-10-CM | POA: Diagnosis not present

## 2023-09-26 DIAGNOSIS — Z3480 Encounter for supervision of other normal pregnancy, unspecified trimester: Secondary | ICD-10-CM

## 2023-09-26 DIAGNOSIS — R519 Headache, unspecified: Secondary | ICD-10-CM | POA: Diagnosis not present

## 2023-09-26 DIAGNOSIS — Z362 Encounter for other antenatal screening follow-up: Secondary | ICD-10-CM

## 2023-09-26 DIAGNOSIS — Z8759 Personal history of other complications of pregnancy, childbirth and the puerperium: Secondary | ICD-10-CM

## 2023-09-26 LAB — POCT URINALYSIS DIPSTICK OB
Blood, UA: NEGATIVE
Glucose, UA: NEGATIVE
Leukocytes, UA: NEGATIVE
Nitrite, UA: NEGATIVE

## 2023-09-26 NOTE — Progress Notes (Signed)
 US : GA = 24 weeks Single active female fetus,  April Tyler Breech, FHR = 140 bpm, AFI = 17.7 cm, 75%, MVP = 5.9 cm EFW 82%, 752g  nl ov's, CL = 4.5 cm, closed

## 2023-09-26 NOTE — Progress Notes (Signed)
   PRENATAL VISIT NOTE  Subjective:  April Tyler is a 33 y.o. [id number] at [redacted]w[redacted]d being seen today for ongoing prenatal care.  She is currently monitored for the following issues for this low-risk pregnancy and has HSV-2 infection; Vitamin D  deficiency; Hyperlipidemia; Anxiety; Encounter for supervision of normal pregnancy, antepartum; and History of postpartum hypertension on their problem list.  Patient reports  having headaches, tylenol  sometimes help, sometimes doesn't .  Contractions: Irritability. Vag. Bleeding: None.  Movement: Present. Denies leaking of fluid.   The following portions of the patient's history were reviewed and updated as appropriate: allergies, current medications, past family history, past medical history, past social history, past surgical history and problem list.   Objective:   Vitals:   09/26/23 1412  BP: 114/72  Pulse: 90  Weight: 260 lb (117.9 kg)    Fetal Status:     Movement: Present     General:  Alert, oriented and cooperative. Patient is in no acute distress.  Skin: Skin is warm and dry. No rash noted.   Cardiovascular: Normal heart rate noted  Respiratory: Normal respiratory effort, no problems with respiration noted  Abdomen: Soft, gravid, appropriate for gestational age.  Pain/Pressure: Present     Pelvic: Cervical exam deferred        Extremities: Normal range of motion.     Mental Status: Normal mood and affect. Normal behavior. Normal judgment and thought content.    US : GA = 24 weeks Single active female fetus,  Samuel Crock Breech, FHR = 140 bpm, AFI = 17.7 cm, 75%, MVP = 5.9 cm Incomplete cardiac screen  -  Cardiac structures documented except 3VV and 3VTV shadowed out by breech prone position.   Normal 4 ch view, situs, axis, size. Nl GV's crossing. Need to complete cardiac screen EFW 82%, 752g  nl ov's, CL = 4.5 cm, closed  Assessment and Plan:  Pregnancy: [id number] at [redacted]w[redacted]d 1. [redacted] weeks gestation of pregnancy (Primary) Anticipatory  guidance regarding GTT and labs next visit, discussed NPO status after midnight   - POC Urinalysis Dipstick OB  2. Supervision of other normal pregnancy, antepartum BP and FHR normal Doing well, feeling regular movement  Planning post placental IUD  3. History of postpartum hypertension  - POC Urinalysis Dipstick OB  4. Pregnancy headache in second trimester Some relief with tylenol , discussed extra strength or Excedrin tension, mag oxide  Strict precaution when to follow up for BP, worsening headache or visual changes   Preterm labor symptoms and general obstetric precautions including but not limited to vaginal bleeding, contractions, leaking of fluid and fetal movement were reviewed in detail with the patient. Please refer to After Visit Summary for other counseling recommendations.   Return in about 4 weeks (around 10/24/2023), or LOB, PN2.  Future Appointments  Date Time Provider Department Center  10/24/2023  9:50 AM Jolayne Natter, CNM [facility]    Susi Eric, FNP

## 2023-10-23 ENCOUNTER — Other Ambulatory Visit: Payer: Self-pay | Admitting: Obstetrics & Gynecology

## 2023-10-23 DIAGNOSIS — Z6839 Body mass index (BMI) 39.0-39.9, adult: Secondary | ICD-10-CM

## 2023-10-23 DIAGNOSIS — Z3689 Encounter for other specified antenatal screening: Secondary | ICD-10-CM

## 2023-10-24 ENCOUNTER — Ambulatory Visit: Payer: PRIVATE HEALTH INSURANCE | Admitting: Radiology

## 2023-10-24 ENCOUNTER — Other Ambulatory Visit: Payer: PRIVATE HEALTH INSURANCE

## 2023-10-24 ENCOUNTER — Encounter: Payer: Self-pay | Admitting: Advanced Practice Midwife

## 2023-10-24 ENCOUNTER — Ambulatory Visit: Payer: PRIVATE HEALTH INSURANCE | Admitting: Advanced Practice Midwife

## 2023-10-24 VITALS — BP 109/73 | HR 94 | Wt 260.0 lb

## 2023-10-24 DIAGNOSIS — Z3689 Encounter for other specified antenatal screening: Secondary | ICD-10-CM

## 2023-10-24 DIAGNOSIS — Z3483 Encounter for supervision of other normal pregnancy, third trimester: Secondary | ICD-10-CM | POA: Diagnosis not present

## 2023-10-24 DIAGNOSIS — Z6839 Body mass index (BMI) 39.0-39.9, adult: Secondary | ICD-10-CM | POA: Diagnosis not present

## 2023-10-24 DIAGNOSIS — Z3A28 28 weeks gestation of pregnancy: Secondary | ICD-10-CM

## 2023-10-24 DIAGNOSIS — Z3480 Encounter for supervision of other normal pregnancy, unspecified trimester: Secondary | ICD-10-CM

## 2023-10-24 DIAGNOSIS — Z131 Encounter for screening for diabetes mellitus: Secondary | ICD-10-CM

## 2023-10-24 NOTE — Patient Instructions (Signed)
 April Tyler, thank you for choosing our office today! We appreciate the opportunity to meet your healthcare needs. You may receive a short survey by mail, e-mail, or through [employer]. If you are happy with your care we would appreciate if you could take just a few minutes to complete the survey questions. We read all of your comments and take your feedback very seriously. Thank you again for choosing our office.  Center for [employer] Team at [facility]  [facility] at [facility] ([address] [place], [place] [address]) Entrance C, located off of [address] Free 24/7 valet parking   CLASSES: Go to [email or web address] to register for classes (childbirth, breastfeeding, waterbirth, infant CPR, daddy bootcamp, etc.)  Call the office [phone]) or go to [facility] if: You begin to have strong, frequent contractions Your water breaks.  Sometimes it is a big gush of fluid, sometimes it is just a trickle that keeps getting your panties wet or running down your legs You have vaginal bleeding.  It is normal to have a small amount of spotting if your cervix was checked.  You don't feel your baby moving like normal.  If you don't, get you something to eat and drink and lay down and focus on feeling your baby move.   If your baby is still not moving like normal, you should call the office or go to [facility].  Call the office [phone]) or go to [facility] hospital for these signs of pre-eclampsia: Severe headache that does not go away with Tylenol  Visual changes- seeing spots, double, blurred vision Pain under your right breast or upper abdomen that does not go away with Tums or heartburn medicine Nausea and/or vomiting Severe swelling in your hands, feet, and face   Tdap Vaccine It is recommended that you get the Tdap vaccine during the third trimester of EACH pregnancy to help protect your baby from getting pertussis (whooping cough) 27-36 weeks is the BEST time to do  this so that you can pass the protection on to your baby. During pregnancy is better than after pregnancy, but if you are unable to get it during pregnancy it will be offered at the hospital.  You can get this vaccine with us , at the health department, your family doctor, or some local pharmacies Everyone who will be around your baby should also be up-to-date on their vaccines before the baby comes. Adults (who are not pregnant) only need 1 dose of Tdap during adulthood.   [facility] Pediatricians/Family Doctors [facility]): [address] Meg Spina, [phone]           [facility]: [address], [phone]                [facility]): [address], [phone] (call to ask if accepting patients) [facility]: [address], [place], [phone]    [facility] Pediatricians/Family Doctors [facility]): [phone], [phone] [facility]: [address] [place], [phone] [facility]: [address], [phone]  [facility] Doctors  [facility]): [phone] [facility]: [address] [phone]   [facility] Doctors [facility]: [address], [phone]  [facility] Family Doctors  [facility]: [phone], [phone]  Home Blood Pressure Monitoring for Patients   Your provider has recommended that you check your  blood pressure (BP) at least once a week at home. If you do not have a blood pressure cuff at home, one will be provided for you. Contact your provider if you have not received your monitor within 1 week.   Helpful Tips for Accurate Home Blood Pressure Checks  Don't smoke, exercise, or drink caffeine 30 minutes before checking your BP Use the restroom before checking your BP (a full bladder can raise your  pressure) Relax in a comfortable upright chair Feet on the ground Left arm resting comfortably on a flat surface at the level of your heart Legs uncrossed Back supported Sit quietly and don't talk Place the cuff on your bare arm Adjust snuggly, so that only two fingertips can fit between your skin and the top of the cuff Check 2 readings separated by at least one minute Keep a log of your BP readings For a visual, please reference this diagram: http://ccnc.care/bpdiagram  Provider Name: [facility]     Phone: [phone]  Zone 1: ALL CLEAR  Continue to monitor your symptoms:  BP reading is less than 140 (top number) or less than 90 (bottom number)  No right upper stomach pain No headaches or seeing spots No feeling nauseated or throwing up No swelling in face and hands  Zone 2: CAUTION Call your doctor's office for any of the following:  BP reading is greater than 140 (top number) or greater than 90 (bottom number)  Stomach pain under your ribs in the middle or right side Headaches or seeing spots Feeling nauseated or throwing up Swelling in face and hands  Zone 3: EMERGENCY  Seek immediate medical care if you have any of the following:  BP reading is greater than160 (top number) or greater than 110 (bottom number) Severe headaches not improving with Tylenol  Serious difficulty catching your breath Any worsening symptoms from Zone 2   Third Trimester of Pregnancy The third trimester is from week 29 through week 42, months 7 through 9. The third trimester is a time when the fetus is growing rapidly. At the end of the ninth month, the fetus is about 20 inches in length and weighs 6-10 pounds.  BODY CHANGES Your body goes through many changes during pregnancy. The changes vary from woman to woman.  Your weight will continue to increase. You can expect to gain 25-35 pounds (11-16 kg) by the end of the pregnancy. You may begin to get stretch marks on your hips, abdomen,  and breasts. You may urinate more often because the fetus is moving lower into your pelvis and pressing on your bladder. You may develop or continue to have heartburn as a result of your pregnancy. You may develop constipation because certain hormones are causing the muscles that push waste through your intestines to slow down. You may develop hemorrhoids or swollen, bulging veins (varicose veins). You may have pelvic pain because of the weight gain and pregnancy hormones relaxing your joints between the bones in your pelvis. Backaches may result from overexertion of the muscles supporting your posture. You may have changes in your hair. These can include thickening of your hair, rapid growth, and changes in texture. Some women also have hair loss during or after pregnancy, or hair that feels dry or thin. Your hair will most likely return to normal after your baby is born. Your breasts will continue to grow and be tender. A yellow discharge may leak from your breasts called colostrum. Your belly button may stick out. You may  feel short of breath because of your expanding uterus. You may notice the fetus "dropping," or moving lower in your abdomen. You may have a bloody mucus discharge. This usually occurs a few days to a week before labor begins. Your cervix becomes thin and soft (effaced) near your due date. WHAT TO EXPECT AT YOUR PRENATAL EXAMS  You will have prenatal exams every 2 weeks until week 36. Then, you will have weekly prenatal exams. During a routine prenatal visit: You will be weighed to make sure you and the fetus are growing normally. Your blood pressure is taken. Your abdomen will be measured to track your baby's growth. The fetal heartbeat will be listened to. Any test results from the previous visit will be discussed. You may have a cervical check near your due date to see if you have effaced. At around 36 weeks, your caregiver will check your cervix. At the same time, your  caregiver will also perform a test on the secretions of the vaginal tissue. This test is to determine if a type of bacteria, Group B streptococcus, is present. Your caregiver will explain this further. Your caregiver may ask you: What your birth plan is. How you are feeling. If you are feeling the baby move. If you have had any abnormal symptoms, such as leaking fluid, bleeding, severe headaches, or abdominal cramping. If you have any questions. Other tests or screenings that may be performed during your third trimester include: Blood tests that check for low iron  levels (anemia). Fetal testing to check the health, activity level, and growth of the fetus. Testing is done if you have certain medical conditions or if there are problems during the pregnancy. FALSE LABOR You may feel small, irregular contractions that eventually go away. These are called Braxton Hicks contractions, or false labor. Contractions may last for hours, days, or even weeks before true labor sets in. If contractions come at regular intervals, intensify, or become painful, it is best to be seen by your caregiver.  SIGNS OF LABOR  Menstrual-like cramps. Contractions that are 5 minutes apart or less. Contractions that start on the top of the uterus and spread down to the lower abdomen and back. A sense of increased pelvic pressure or back pain. A watery or bloody mucus discharge that comes from the vagina. If you have any of these signs before the 37th week of pregnancy, call your caregiver right away. You need to go to the hospital to get checked immediately. HOME CARE INSTRUCTIONS  Avoid all smoking, herbs, alcohol, and unprescribed drugs. These chemicals affect the formation and growth of the baby. Follow your caregiver's instructions regarding medicine use. There are medicines that are either safe or unsafe to take during pregnancy. Exercise only as directed by your caregiver. Experiencing uterine cramps is a good sign to  stop exercising. Continue to eat regular, healthy meals. Wear a good support bra for breast tenderness. Do not use hot tubs, steam rooms, or saunas. Wear your seat belt at all times when driving. Avoid raw meat, uncooked cheese, cat litter boxes, and soil used by cats. These carry germs that can cause birth defects in the baby. Take your prenatal vitamins. Try taking a stool softener (if your caregiver approves) if you develop constipation. Eat more high-fiber foods, such as fresh vegetables or fruit and whole grains. Drink plenty of fluids to keep your urine clear or pale yellow. Take warm sitz baths to soothe any pain or discomfort caused by hemorrhoids. Use hemorrhoid cream if  your caregiver approves. If you develop varicose veins, wear support hose. Elevate your feet for 15 minutes, 3-4 times a day. Limit salt in your diet. Avoid heavy lifting, wear low heal shoes, and practice good posture. Rest a lot with your legs elevated if you have leg cramps or low back pain. Visit your dentist if you have not gone during your pregnancy. Use a soft toothbrush to brush your teeth and be gentle when you floss. A sexual relationship may be continued unless your caregiver directs you otherwise. Do not travel far distances unless it is absolutely necessary and only with the approval of your caregiver. Take prenatal classes to understand, practice, and ask questions about the labor and delivery. Make a trial run to the hospital. Pack your hospital bag. Prepare the baby's nursery. Continue to go to all your prenatal visits as directed by your caregiver. SEEK MEDICAL CARE IF: You are unsure if you are in labor or if your water has broken. You have dizziness. You have mild pelvic cramps, pelvic pressure, or nagging pain in your abdominal area. You have persistent nausea, vomiting, or diarrhea. You have a bad smelling vaginal discharge. You have pain with urination. SEEK IMMEDIATE MEDICAL CARE IF:  You  have a fever. You are leaking fluid from your vagina. You have spotting or bleeding from your vagina. You have severe abdominal cramping or pain. You have rapid weight loss or gain. You have shortness of breath with chest pain. You notice sudden or extreme swelling of your face, hands, ankles, feet, or legs. You have not felt your baby move in over an hour. You have severe headaches that do not go away with medicine. You have vision changes. Document Released: 05/03/2001 Document Revised: 05/14/2013 Document Reviewed: 07/10/2012 [facility] Patient Information 2015 [place], [place]. This information is not intended to replace advice given to you by your health care provider. Make sure you discuss any questions you have with your health care provider.

## 2023-10-24 NOTE — Progress Notes (Signed)
 US : GA = 28 weeks Single active female fetus, cephalic, FHR = 140 bpm  posterior pl, gr2, AFI = 13.7 cm, 39%, MVP = 4.0 cm,  EFW67%, 1283g, 67%, nl ov's

## 2023-10-24 NOTE — Progress Notes (Signed)
   LOW-RISK PREGNANCY VISIT Patient name: AVIYANNA COLBAUGH MRN [id number]  Date of birth: 08/31/1990 Chief Complaint:   Routine Prenatal Visit (PN2) and Pregnancy Ultrasound (Tdap information given)  History of Present Illness:   April Tyler is a 33 y.o. [id number] female at [redacted]w[redacted]d with an Estimated Date of Delivery: 01/16/24 being seen today for ongoing management of a low-risk pregnancy.  Today she reports doing well; was more fatigued but better now. Contractions: Irregular.  .  Movement: Present. denies leaking of fluid. Review of Systems:   Pertinent items are noted in HPI Denies abnormal vaginal discharge w/ itching/odor/irritation, headaches, visual changes, shortness of breath, chest pain, abdominal pain, severe nausea/vomiting, or problems with urination or bowel movements unless otherwise stated above. Pertinent History Reviewed:  Reviewed past medical,surgical, social, obstetrical and family history.  Reviewed problem list, medications and allergies. Physical Assessment:   Vitals:   10/24/23 1007  BP: 109/73  Pulse: 94  Weight: 260 lb (117.9 kg)  Body mass index is 39.53 kg/m.        Physical Examination:   General appearance: Well appearing, and in no distress  Mental status: Alert, oriented to person, place, and time  Skin: Warm & dry  Cardiovascular: Normal heart rate noted  Respiratory: Normal respiratory effort, no distress  Abdomen: Soft, gravid, nontender  Pelvic: Cervical exam deferred         Extremities: Edema: None  Fetal Status: Fetal Heart Rate (bpm): 140 u/s   Movement: Present    F/U anatomy scan: US : GA = 28 weeks Single active female fetus, cephalic, cardiac screen completed, no apparent abn seen, FHR = 140 bpm  posterior pl, gr2, AFI = 13.7 cm, 39%, MVP = 4.0 cm,  EFW67%, 1283g, 67%, nl ov's  No results found for this or any previous visit (from the past 24 hours).  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  Labs/procedures today: f/u anatomy scan; PN2; Tdap info given- not sure still  Plan:  Continue routine obstetrical care   Reviewed: Preterm labor symptoms and general obstetric precautions including but not limited to vaginal bleeding, contractions, leaking of fluid and fetal movement were reviewed in detail with the patient.  All questions were answered. Has home bp cuff. Check bp weekly, let us  know if >140/90.   Follow-up: Return in about 2 weeks (around 11/07/2023) for LROB, in person.  No orders of the defined types were placed in this encounter.  Jolayne Natter CNM 10/24/2023 10:24 AM

## 2023-10-25 ENCOUNTER — Encounter: Payer: Self-pay | Admitting: Advanced Practice Midwife

## 2023-10-25 ENCOUNTER — Ambulatory Visit: Payer: Self-pay | Admitting: Advanced Practice Midwife

## 2023-10-25 ENCOUNTER — Other Ambulatory Visit: Payer: Self-pay | Admitting: Advanced Practice Midwife

## 2023-10-25 DIAGNOSIS — O99019 Anemia complicating pregnancy, unspecified trimester: Secondary | ICD-10-CM | POA: Insufficient documentation

## 2023-10-25 LAB — CBC
Hematocrit: 29.8 % — ABNORMAL LOW (ref 34.0–46.6)
Hemoglobin: 9.4 g/dL — ABNORMAL LOW (ref 11.1–15.9)
MCH: 26.8 pg (ref 26.6–33.0)
MCHC: 31.5 g/dL (ref 31.5–35.7)
MCV: 85 fL (ref 79–97)
Platelets: 226 10*3/uL (ref 150–450)
RBC: 3.51 x10E6/uL — ABNORMAL LOW (ref 3.77–5.28)
RDW: 13.1 % (ref 11.7–15.4)
WBC: 7.5 10*3/uL (ref 3.4–10.8)

## 2023-10-25 LAB — HIV ANTIBODY (ROUTINE TESTING W REFLEX): HIV Screen 4th Generation wRfx: NONREACTIVE

## 2023-10-25 LAB — ANTIBODY SCREEN: Antibody Screen: NEGATIVE

## 2023-10-25 LAB — GLUCOSE TOLERANCE, 2 HOURS W/ 1HR
Glucose, 1 hour: 91 mg/dL (ref 70–179)
Glucose, 2 hour: 86 mg/dL (ref 70–152)
Glucose, Fasting: 82 mg/dL (ref 70–91)

## 2023-10-25 LAB — RPR: RPR Ser Ql: NONREACTIVE

## 2023-10-25 MED ORDER — FERROUS FUMARATE 324 (106 FE) MG PO TABS: 1.0000 | ORAL_TABLET | ORAL | 3 refills | Status: AC

## 2023-11-01 ENCOUNTER — Ambulatory Visit: Payer: PRIVATE HEALTH INSURANCE | Admitting: *Deleted

## 2023-11-01 ENCOUNTER — Ambulatory Visit: Payer: Self-pay | Admitting: Advanced Practice Midwife

## 2023-11-01 ENCOUNTER — Other Ambulatory Visit: Payer: Self-pay | Admitting: Advanced Practice Midwife

## 2023-11-01 VITALS — BP 118/75 | HR 81

## 2023-11-01 DIAGNOSIS — R300 Dysuria: Secondary | ICD-10-CM

## 2023-11-01 LAB — POCT URINALYSIS DIPSTICK OB
Glucose, UA: NEGATIVE
Ketones, UA: NEGATIVE
Nitrite, UA: NEGATIVE
POC,PROTEIN,UA: NEGATIVE

## 2023-11-01 MED ORDER — NITROFURANTOIN MONOHYD MACRO 100 MG PO CAPS: 100.0000 mg | ORAL_CAPSULE | Freq: Two times a day (BID) | ORAL | 0 refills | Status: DC

## 2023-11-01 NOTE — Progress Notes (Signed)
   NURSE VISIT- UTI SYMPTOMS   SUBJECTIVE:  April Tyler is a 33 y.o. [phone] female here for UTI symptoms. She is [redacted]w[redacted]d pregnant. She reports dysuria and lower abdominal pain.  OBJECTIVE:  BP 118/75   Pulse 81   LMP 04/11/2023   Appears well, in no apparent distress  No results found for this or any previous visit (from the past 24 hours).  ASSESSMENT: Pregnancy [redacted]w[redacted]d with UTI symptoms and negative nitrites  PLAN: Discussed with April Tyler, CNM   Rx sent by provider today: Yes Urine culture sent Call or return to clinic prn if these symptoms worsen or fail to improve as anticipated. Follow-up: as scheduled   April Tyler  11/01/2023 10:34 AM

## 2023-11-04 LAB — URINE CULTURE

## 2023-11-07 ENCOUNTER — Encounter: Payer: Self-pay | Admitting: Women's Health

## 2023-11-07 ENCOUNTER — Ambulatory Visit: Payer: PRIVATE HEALTH INSURANCE | Admitting: Women's Health

## 2023-11-07 VITALS — BP 118/72 | HR 76 | Wt 262.4 lb

## 2023-11-07 DIAGNOSIS — O09293 Supervision of pregnancy with other poor reproductive or obstetric history, third trimester: Secondary | ICD-10-CM | POA: Diagnosis not present

## 2023-11-07 DIAGNOSIS — Z3A30 30 weeks gestation of pregnancy: Secondary | ICD-10-CM | POA: Diagnosis not present

## 2023-11-07 DIAGNOSIS — D649 Anemia, unspecified: Secondary | ICD-10-CM

## 2023-11-07 DIAGNOSIS — Z3483 Encounter for supervision of other normal pregnancy, third trimester: Secondary | ICD-10-CM

## 2023-11-07 DIAGNOSIS — Z3480 Encounter for supervision of other normal pregnancy, unspecified trimester: Secondary | ICD-10-CM

## 2023-11-07 DIAGNOSIS — O99013 Anemia complicating pregnancy, third trimester: Secondary | ICD-10-CM

## 2023-11-07 NOTE — Patient Instructions (Signed)
 Annelies, thank you for choosing our office today! We appreciate the opportunity to meet your healthcare needs. You may receive a short survey by mail, e-mail, or through [employer]. If you are happy with your care we would appreciate if you could take just a few minutes to complete the survey questions. We read all of your comments and take your feedback very seriously. Thank you again for choosing our office.  Center for [employer] Team at [facility]  [facility] ([address] [place], [place] [address]) Entrance C, located off of [address] Free 24/7 valet parking   CLASSES: Go to [email or web address] to register for classes (childbirth, breastfeeding, waterbirth, infant CPR, daddy bootcamp, etc.)  Call the office [phone]) or go to [facility] if: You begin to have strong, frequent contractions Your water breaks.  Sometimes it is a big gush of fluid, sometimes it is just a trickle that keeps getting your panties wet or running down your legs You have vaginal bleeding.  It is normal to have a small amount of spotting if your cervix was checked.  You don't feel your baby moving like normal.  If you don't, get you something to eat and drink and lay down and focus on feeling your baby move.   If your baby is still not moving like normal, you should call the office or go to [facility].  Call the office [phone]) or go to [facility] hospital for these signs of pre-eclampsia: Severe headache that does not go away with Tylenol  Visual changes- seeing spots, double, blurred vision Pain under your right breast or upper abdomen that does not go away with Tums or heartburn medicine Nausea and/or vomiting Severe swelling in your hands, feet, and face   Tdap Vaccine It is recommended that you get the Tdap vaccine during the third trimester of EACH pregnancy to help protect your baby from getting pertussis (whooping cough) 27-36 weeks is the BEST time to do  this so that you can pass the protection on to your baby. During pregnancy is better than after pregnancy, but if you are unable to get it during pregnancy it will be offered at the hospital.  You can get this vaccine with us , at the health department, your family doctor, or some local pharmacies Everyone who will be around your baby should also be up-to-date on their vaccines before the baby comes. Adults (who are not pregnant) only need 1 dose of Tdap during adulthood.   [facility] Pediatricians/Family Doctors  [facility]): [address] Meg Spina, [phone]           [facility]: [address], [phone]                [facility]): [address], [phone] (call to ask if accepting patients) [facility]: [address], [place], [phone]    [facility] Pediatricians/Family Doctors [facility]): [phone], [phone] [facility]: [address] [place], [phone] [facility]: [address], [phone]  [facility] Doctors  [facility]): [phone] [facility]: [address] [phone]   [facility] Doctors [facility]: [address], [phone]  [facility] Family Doctors  [facility]: [phone], [phone]  Home Blood Pressure Monitoring for Patients   Your provider has recommended that you check your  blood pressure (BP) at least once a week at home. If you do not have a blood pressure cuff at home, one will be provided for you. Contact your provider if you have not received your monitor within 1 week.   Helpful Tips for Accurate Home Blood Pressure Checks  Don't smoke, exercise, or drink caffeine 30 minutes before checking your BP Use the restroom before checking your BP (a full bladder can raise your  pressure) Relax in a comfortable upright chair Feet on the ground Left arm resting comfortably on a flat surface at the level of your heart Legs uncrossed Back supported Sit quietly and don't talk Place the cuff on your bare arm Adjust snuggly, so that only two fingertips can fit between your skin and the top of the cuff Check 2 readings separated by at least one minute Keep a log of your BP readings For a visual, please reference this diagram: http://ccnc.care/bpdiagram  Provider Name: [facility]     Phone: [phone]  Zone 1: ALL CLEAR  Continue to monitor your symptoms:  BP reading is less than 140 (top number) or less than 90 (bottom number)  No right upper stomach pain No headaches or seeing spots No feeling nauseated or throwing up No swelling in face and hands  Zone 2: CAUTION Call your doctor's office for any of the following:  BP reading is greater than 140 (top number) or greater than 90 (bottom number)  Stomach pain under your ribs in the middle or right side Headaches or seeing spots Feeling nauseated or throwing up Swelling in face and hands  Zone 3: EMERGENCY  Seek immediate medical care if you have any of the following:  BP reading is greater than160 (top number) or greater than 110 (bottom number) Severe headaches not improving with Tylenol  Serious difficulty catching your breath Any worsening symptoms from Zone 2  Preterm Labor and Birth Information  The normal length of a pregnancy is 39-41 weeks. Preterm labor is when labor starts before 37 completed weeks of pregnancy. What are the risk factors for preterm labor? Preterm labor is more likely to occur in women who: Have certain infections during pregnancy such as a bladder infection, sexually transmitted infection, or infection inside the uterus (chorioamnionitis). Have a shorter-than-normal cervix. Have gone into preterm labor before. Have had surgery on their cervix. Are younger than age 54  or older than age 80. Are African American. Are pregnant with twins or multiple babies (multiple gestation). Take street drugs or smoke while pregnant. Do not gain enough weight while pregnant. Became pregnant shortly after having been pregnant. What are the symptoms of preterm labor? Symptoms of preterm labor include: Cramps similar to those that can happen during a menstrual period. The cramps may happen with diarrhea. Pain in the abdomen or lower back. Regular uterine contractions that may feel like tightening of the abdomen. A feeling of increased pressure in the pelvis. Increased watery or bloody mucus discharge from the vagina. Water breaking (ruptured amniotic sac). Why is it important to recognize signs of preterm labor? It is important to recognize signs of preterm labor because babies who are born prematurely may not be fully developed. This can put them at an increased risk for: Long-term (chronic) heart and lung problems. Difficulty immediately after birth with regulating body systems, including blood sugar, body temperature, heart rate, and breathing rate. Bleeding in the brain. Cerebral palsy. Learning difficulties. Death. These risks are highest for babies who are born before 34 weeks  of pregnancy. How is preterm labor treated? Treatment depends on the length of your pregnancy, your condition, and the health of your baby. It may involve: Having a stitch (suture) placed in your cervix to prevent your cervix from opening too early (cerclage). Taking or being given medicines, such as: Hormone medicines. These may be given early in pregnancy to help support the pregnancy. Medicine to stop contractions. Medicines to help mature the baby's lungs. These may be prescribed if the risk of delivery is high. Medicines to prevent your baby from developing cerebral palsy. If the labor happens before 34 weeks of pregnancy, you may need to stay in the hospital. What should I do if I  think I am in preterm labor? If you think that you are going into preterm labor, call your health care provider right away. How can I prevent preterm labor in future pregnancies? To increase your chance of having a full-term pregnancy: Do not use any tobacco products, such as cigarettes, chewing tobacco, and e-cigarettes. If you need help quitting, ask your health care provider. Do not use street drugs or medicines that have not been prescribed to you during your pregnancy. Talk with your health care provider before taking any herbal supplements, even if you have been taking them regularly. Make sure you gain a healthy amount of weight during your pregnancy. Watch for infection. If you think that you might have an infection, get it checked right away. Make sure to tell your health care provider if you have gone into preterm labor before. This information is not intended to replace advice given to you by your health care provider. Make sure you discuss any questions you have with your health care provider. Document Revised: 08/31/2018 Document Reviewed: 09/30/2015 Elsevier Patient Education  2020 [employer].

## 2023-11-07 NOTE — Progress Notes (Signed)
 LOW-RISK PREGNANCY VISIT Patient name: April Tyler MRN [id number]  Date of birth: November 07, 1990 Chief Complaint:   Routine Prenatal Visit  History of Present Illness:   April Tyler is a 33 y.o. [id number] female at [redacted]w[redacted]d with an Estimated Date of Delivery: 01/16/24 being seen today for ongoing management of a low-risk pregnancy.   Today she reports no complaints. Contractions: Not present.  .  Movement: Present. denies leaking of fluid.     10/24/2023   10:09 AM 07/03/2023    3:04 PM 05/23/2023    3:36 PM 09/23/2021    3:10 PM 05/27/2020    4:33 PM  Depression screen PHQ 2/9  Decreased Interest 0 0 1 0 1  Down, Depressed, Hopeless 0 0 0 0 1  PHQ - 2 Score 0 0 1 0 2  Altered sleeping 0 0 2 0 0  Tired, decreased energy 1 0 1 2 3   Change in appetite 0 0 2 0 1  Feeling bad or failure about yourself  0 0 0 0 0  Trouble concentrating 0 0 1 0 1  Moving slowly or fidgety/restless 0 0 0 0 0  Suicidal thoughts 0 0 0 0 0  PHQ-9 Score 1 0 7 2 7   Difficult doing work/chores Not difficult at all   Not difficult at all Not difficult at all        07/03/2023    3:04 PM 05/23/2023    3:36 PM 09/23/2021    3:22 PM  GAD 7 : Generalized Anxiety Score  Nervous, Anxious, on Edge 0 1 2  Control/stop worrying 0 0 3  Worry too much - different things 0 1 3  Trouble relaxing 0 0 0  Restless 0 0 0  Easily annoyed or irritable 0 0 2  Afraid - awful might happen 0 0 2  Total GAD 7 Score 0 2 12      Review of Systems:   Pertinent items are noted in HPI Denies abnormal vaginal discharge w/ itching/odor/irritation, headaches, visual changes, shortness of breath, chest pain, abdominal pain, severe nausea/vomiting, or problems with urination or bowel movements unless otherwise stated above. Pertinent History Reviewed:  Reviewed past medical,surgical, social, obstetrical and family history.  Reviewed problem list, medications and allergies. Physical Assessment:   Vitals:   11/07/23 1414  BP: 118/72   Pulse: 76  Weight: 262 lb 6.4 oz (119 kg)  Body mass index is 39.9 kg/m.        Physical Examination:   General appearance: Well appearing, and in no distress  Mental status: Alert, oriented to person, place, and time  Skin: Warm & dry  Cardiovascular: Normal heart rate noted  Respiratory: Normal respiratory effort, no distress  Abdomen: Soft, gravid, nontender  Pelvic: Cervical exam deferred         Extremities: Edema: None  Fetal Status: Fetal Heart Rate (bpm): 134 Fundal Height: 30 cm Movement: Present    Chaperone: N/A No results found for this or any previous visit (from the past 24 hours).  Assessment & Plan:  1) Low-risk pregnancy [id number] at [redacted]w[redacted]d with an Estimated Date of Delivery: 01/16/24   2) H/O PPHTN, ASA  3) Anemia> continue Fe, recheck at one of upcoming visits   Meds: No orders of the defined types were placed in this encounter.  Labs/procedures today: none  Plan:  Continue routine obstetrical care  Next visit: prefers in person    Reviewed: Preterm labor symptoms and general obstetric precautions including  but not limited to vaginal bleeding, contractions, leaking of fluid and fetal movement were reviewed in detail with the patient.  All questions were answered. Does have home bp cuff. Office bp cuff given: not applicable. Check bp weekly, let us  know if consistently >140 and/or >90.  Follow-up: Return in about 2 weeks (around 11/21/2023) for LROB, CNM, in person.  No future appointments.  No orders of the defined types were placed in this encounter.  Ferd Householder CNM, [facility] 11/07/2023 2:30 PM

## 2023-11-13 ENCOUNTER — Other Ambulatory Visit: Payer: Self-pay | Admitting: Adult Health

## 2023-11-22 ENCOUNTER — Ambulatory Visit: Payer: PRIVATE HEALTH INSURANCE | Admitting: Women's Health

## 2023-11-22 ENCOUNTER — Encounter: Payer: Self-pay | Admitting: Women's Health

## 2023-11-22 VITALS — BP 126/78 | HR 85 | Wt 269.0 lb

## 2023-11-22 DIAGNOSIS — D649 Anemia, unspecified: Secondary | ICD-10-CM

## 2023-11-22 DIAGNOSIS — Z3A32 32 weeks gestation of pregnancy: Secondary | ICD-10-CM | POA: Diagnosis not present

## 2023-11-22 DIAGNOSIS — O99013 Anemia complicating pregnancy, third trimester: Secondary | ICD-10-CM | POA: Diagnosis not present

## 2023-11-22 DIAGNOSIS — Z3480 Encounter for supervision of other normal pregnancy, unspecified trimester: Secondary | ICD-10-CM

## 2023-11-22 DIAGNOSIS — Z3483 Encounter for supervision of other normal pregnancy, third trimester: Secondary | ICD-10-CM

## 2023-11-22 LAB — POCT HEMOGLOBIN: Hemoglobin: 9 g/dL — AB (ref 11–14.6)

## 2023-11-22 NOTE — Addendum Note (Signed)
 Addended by: SANNA GONG A on: 11/22/2023 03:40 PM   Modules accepted: Orders

## 2023-11-22 NOTE — Progress Notes (Signed)
 LOW-RISK PREGNANCY VISIT Patient name: April Tyler MRN [id number]  Date of birth: 29-Mar-1991 Chief Complaint:   Routine Prenatal Visit  History of Present Illness:   April Tyler is a 33 y.o. [id number] female at [redacted]w[redacted]d with an Estimated Date of Delivery: 01/16/24 being seen today for ongoing management of a low-risk pregnancy.   Today she reports no complaints. Contractions: Irritability.  .  Movement: Present. denies leaking of fluid.     10/24/2023   10:09 AM 07/03/2023    3:04 PM 05/23/2023    3:36 PM 09/23/2021    3:10 PM 05/27/2020    4:33 PM  Depression screen PHQ 2/9  Decreased Interest 0 0 1 0 1  Down, Depressed, Hopeless 0 0 0 0 1  PHQ - 2 Score 0 0 1 0 2  Altered sleeping 0 0 2 0 0  Tired, decreased energy 1 0 1 2 3   Change in appetite 0 0 2 0 1  Feeling bad or failure about yourself  0 0 0 0 0  Trouble concentrating 0 0 1 0 1  Moving slowly or fidgety/restless 0 0 0 0 0  Suicidal thoughts 0 0 0 0 0  PHQ-9 Score 1 0 7 2 7   Difficult doing work/chores Not difficult at all   Not difficult at all Not difficult at all        07/03/2023    3:04 PM 05/23/2023    3:36 PM 09/23/2021    3:22 PM  GAD 7 : Generalized Anxiety Score  Nervous, Anxious, on Edge 0 1 2  Control/stop worrying 0 0 3  Worry too much - different things 0 1 3  Trouble relaxing 0 0 0  Restless 0 0 0  Easily annoyed or irritable 0 0 2  Afraid - awful might happen 0 0 2  Total GAD 7 Score 0 2 12      Review of Systems:   Pertinent items are noted in HPI Denies abnormal vaginal discharge w/ itching/odor/irritation, headaches, visual changes, shortness of breath, chest pain, abdominal pain, severe nausea/vomiting, or problems with urination or bowel movements unless otherwise stated above. Pertinent History Reviewed:  Reviewed past medical,surgical, social, obstetrical and family history.  Reviewed problem list, medications and allergies. Physical Assessment:   Vitals:   11/22/23 1516  BP:  126/78  Pulse: 85  Weight: 269 lb (122 kg)  Body mass index is 40.9 kg/m.        Physical Examination:   General appearance: Well appearing, and in no distress  Mental status: Alert, oriented to person, place, and time  Skin: Warm & dry  Cardiovascular: Normal heart rate noted  Respiratory: Normal respiratory effort, no distress  Abdomen: Soft, gravid, nontender  Pelvic: Cervical exam deferred         Extremities: Edema: None  Fetal Status: Fetal Heart Rate (bpm): 132 Fundal Height: 32 cm Movement: Present    Fingerstick hgb: 9.0  Chaperone: N/A No results found for this or any previous visit (from the past 24 hours).  Assessment & Plan:  1) Low-risk pregnancy [id number] at [redacted]w[redacted]d with an Estimated Date of Delivery: 01/16/24   2) Anemia, fingerstick hgb today 9.0, will confirm w/ CBC, continue Fe QOD   Meds: No orders of the defined types were placed in this encounter.  Labs/procedures today: fingerstick hgb  Plan:  Continue routine obstetrical care  Next visit: prefers in person    Reviewed: Preterm labor symptoms and general obstetric precautions including  but not limited to vaginal bleeding, contractions, leaking of fluid and fetal movement were reviewed in detail with the patient.  All questions were answered. Does have home bp cuff. Office bp cuff given: not applicable. Check bp weekly, let us  know if consistently >140 and/or >90.  Follow-up: Return for As scheduled.  Future Appointments  Date Time Provider Department Center  12/05/2023  4:10 PM Kizzie Suzen SAUNDERS, [place] [facility]  12/19/2023  4:10 PM Kizzie Suzen SAUNDERS, CNM [facility]    Orders Placed This Encounter  Procedures   CBC   Suzen SAUNDERS Kizzie CNM, [facility] 11/22/2023 3:35 PM

## 2023-11-22 NOTE — Patient Instructions (Signed)
 April Tyler, thank you for choosing our office today! We appreciate the opportunity to meet your healthcare needs. You may receive a short survey by mail, e-mail, or through [employer]. If you are happy with your care we would appreciate if you could take just a few minutes to complete the survey questions. We read all of your comments and take your feedback very seriously. Thank you again for choosing our office.  Center for [employer] Team at [facility]  [facility] ([address] [place], [place] [address]) Entrance C, located off of [address] Free 24/7 valet parking   CLASSES: Go to [email or web address] to register for classes (childbirth, breastfeeding, waterbirth, infant CPR, daddy bootcamp, etc.)  Call the office [phone]) or go to [facility] if: You begin to have strong, frequent contractions Your water breaks.  Sometimes it is a big gush of fluid, sometimes it is just a trickle that keeps getting your panties wet or running down your legs You have vaginal bleeding.  It is normal to have a small amount of spotting if your cervix was checked.  You don't feel your baby moving like normal.  If you don't, get you something to eat and drink and lay down and focus on feeling your baby move.   If your baby is still not moving like normal, you should call the office or go to [facility].  Call the office [phone]) or go to [facility] hospital for these signs of pre-eclampsia: Severe headache that does not go away with Tylenol  Visual changes- seeing spots, double, blurred vision Pain under your right breast or upper abdomen that does not go away with Tums or heartburn medicine Nausea and/or vomiting Severe swelling in your hands, feet, and face   Tdap Vaccine It is recommended that you get the Tdap vaccine during the third trimester of EACH pregnancy to help protect your baby from getting pertussis (whooping cough) 27-36 weeks is the BEST time to do  this so that you can pass the protection on to your baby. During pregnancy is better than after pregnancy, but if you are unable to get it during pregnancy it will be offered at the hospital.  You can get this vaccine with us , at the health department, your family doctor, or some local pharmacies Everyone who will be around your baby should also be up-to-date on their vaccines before the baby comes. Adults (who are not pregnant) only need 1 dose of Tdap during adulthood.   [facility] Pediatricians/Family Doctors  [facility]): [address] Meg Spina, [phone]           [facility]: [address], [phone]                [facility]): [address], [phone] (call to ask if accepting patients) [facility]: [address], [place], [phone]    [facility] Pediatricians/Family Doctors [facility]): [phone], [phone] [facility]: [address] [place], [phone] [facility]: [address], [phone]  [facility] Doctors  [facility]): [phone] [facility]: [address] [phone]   [facility] Doctors [facility]: [address], [phone]  [facility] Family Doctors  [facility]: [phone], [phone]  Home Blood Pressure Monitoring for Patients   Your provider has recommended that you check your  blood pressure (BP) at least once a week at home. If you do not have a blood pressure cuff at home, one will be provided for you. Contact your provider if you have not received your monitor within 1 week.   Helpful Tips for Accurate Home Blood Pressure Checks  Don't smoke, exercise, or drink caffeine 30 minutes before checking your BP Use the restroom before checking your BP (a full bladder can raise your  pressure) Relax in a comfortable upright chair Feet on the ground Left arm resting comfortably on a flat surface at the level of your heart Legs uncrossed Back supported Sit quietly and don't talk Place the cuff on your bare arm Adjust snuggly, so that only two fingertips can fit between your skin and the top of the cuff Check 2 readings separated by at least one minute Keep a log of your BP readings For a visual, please reference this diagram: http://ccnc.care/bpdiagram  Provider Name: [facility]     Phone: [phone]  Zone 1: ALL CLEAR  Continue to monitor your symptoms:  BP reading is less than 140 (top number) or less than 90 (bottom number)  No right upper stomach pain No headaches or seeing spots No feeling nauseated or throwing up No swelling in face and hands  Zone 2: CAUTION Call your doctor's office for any of the following:  BP reading is greater than 140 (top number) or greater than 90 (bottom number)  Stomach pain under your ribs in the middle or right side Headaches or seeing spots Feeling nauseated or throwing up Swelling in face and hands  Zone 3: EMERGENCY  Seek immediate medical care if you have any of the following:  BP reading is greater than160 (top number) or greater than 110 (bottom number) Severe headaches not improving with Tylenol  Serious difficulty catching your breath Any worsening symptoms from Zone 2  Preterm Labor and Birth Information  The normal length of a pregnancy is 39-41 weeks. Preterm labor is when labor starts before 37 completed weeks of pregnancy. What are the risk factors for preterm labor? Preterm labor is more likely to occur in women who: Have certain infections during pregnancy such as a bladder infection, sexually transmitted infection, or infection inside the uterus (chorioamnionitis). Have a shorter-than-normal cervix. Have gone into preterm labor before. Have had surgery on their cervix. Are younger than age 54  or older than age 80. Are African American. Are pregnant with twins or multiple babies (multiple gestation). Take street drugs or smoke while pregnant. Do not gain enough weight while pregnant. Became pregnant shortly after having been pregnant. What are the symptoms of preterm labor? Symptoms of preterm labor include: Cramps similar to those that can happen during a menstrual period. The cramps may happen with diarrhea. Pain in the abdomen or lower back. Regular uterine contractions that may feel like tightening of the abdomen. A feeling of increased pressure in the pelvis. Increased watery or bloody mucus discharge from the vagina. Water breaking (ruptured amniotic sac). Why is it important to recognize signs of preterm labor? It is important to recognize signs of preterm labor because babies who are born prematurely may not be fully developed. This can put them at an increased risk for: Long-term (chronic) heart and lung problems. Difficulty immediately after birth with regulating body systems, including blood sugar, body temperature, heart rate, and breathing rate. Bleeding in the brain. Cerebral palsy. Learning difficulties. Death. These risks are highest for babies who are born before 34 weeks  of pregnancy. How is preterm labor treated? Treatment depends on the length of your pregnancy, your condition, and the health of your baby. It may involve: Having a stitch (suture) placed in your cervix to prevent your cervix from opening too early (cerclage). Taking or being given medicines, such as: Hormone medicines. These may be given early in pregnancy to help support the pregnancy. Medicine to stop contractions. Medicines to help mature the baby's lungs. These may be prescribed if the risk of delivery is high. Medicines to prevent your baby from developing cerebral palsy. If the labor happens before 34 weeks of pregnancy, you may need to stay in the hospital. What should I do if I  think I am in preterm labor? If you think that you are going into preterm labor, call your health care provider right away. How can I prevent preterm labor in future pregnancies? To increase your chance of having a full-term pregnancy: Do not use any tobacco products, such as cigarettes, chewing tobacco, and e-cigarettes. If you need help quitting, ask your health care provider. Do not use street drugs or medicines that have not been prescribed to you during your pregnancy. Talk with your health care provider before taking any herbal supplements, even if you have been taking them regularly. Make sure you gain a healthy amount of weight during your pregnancy. Watch for infection. If you think that you might have an infection, get it checked right away. Make sure to tell your health care provider if you have gone into preterm labor before. This information is not intended to replace advice given to you by your health care provider. Make sure you discuss any questions you have with your health care provider. Document Revised: 08/31/2018 Document Reviewed: 09/30/2015 Elsevier Patient Education  2020 [employer].

## 2023-11-23 ENCOUNTER — Ambulatory Visit: Payer: Self-pay | Admitting: Women's Health

## 2023-11-23 LAB — CBC
Hematocrit: 29.5 % — ABNORMAL LOW (ref 34.0–46.6)
Hemoglobin: 9.6 g/dL — ABNORMAL LOW (ref 11.1–15.9)
MCH: 27.2 pg (ref 26.6–33.0)
MCHC: 32.5 g/dL (ref 31.5–35.7)
MCV: 84 fL (ref 79–97)
Platelets: 257 10*3/uL (ref 150–450)
RBC: 3.53 x10E6/uL — ABNORMAL LOW (ref 3.77–5.28)
RDW: 13.3 % (ref 11.7–15.4)
WBC: 7.8 10*3/uL (ref 3.4–10.8)

## 2023-11-27 ENCOUNTER — Telehealth: Payer: Self-pay

## 2023-11-27 ENCOUNTER — Other Ambulatory Visit: Payer: Self-pay | Admitting: Women's Health

## 2023-11-27 DIAGNOSIS — O99013 Anemia complicating pregnancy, third trimester: Secondary | ICD-10-CM

## 2023-11-27 NOTE — Telephone Encounter (Signed)
 Hello,   Patient will  be scheduled as soon as possible.  Auth Submission: NO AUTH NEEDED Site of care: Site of care: [facility] Payer: [facility] [facility] [place] Medication & CPT/J Code(s) submitted: Venofer  (Iron  Sucrose) J1756 Diagnosis Code:  Route of submission (phone, fax, portal): portal Phone # Fax # Auth type: Buy/Bill HB Units/visits requested: 500mg  x 2doses Reference number:  Approval from: 11/27/23 to 05/22/24

## 2023-11-28 ENCOUNTER — Encounter: Payer: PRIVATE HEALTH INSURANCE | Attending: Women's Health | Admitting: *Deleted

## 2023-11-28 VITALS — BP 124/82 | HR 104 | Temp 98.1°F | Resp 16

## 2023-11-28 DIAGNOSIS — Z3A33 33 weeks gestation of pregnancy: Secondary | ICD-10-CM | POA: Diagnosis not present

## 2023-11-28 DIAGNOSIS — D509 Iron deficiency anemia, unspecified: Secondary | ICD-10-CM | POA: Diagnosis not present

## 2023-11-28 DIAGNOSIS — O99013 Anemia complicating pregnancy, third trimester: Secondary | ICD-10-CM

## 2023-11-28 MED ORDER — SODIUM CHLORIDE 0.9 % IV SOLN
500.0000 mg | Freq: Once | INTRAVENOUS | Status: AC
  Administered 2023-11-28: 500 mg via INTRAVENOUS
  Filled 2023-11-28: qty 25

## 2023-11-28 MED ORDER — ACETAMINOPHEN 325 MG PO TABS
650.0000 mg | ORAL_TABLET | Freq: Once | ORAL | Status: AC
  Administered 2023-11-28: 650 mg via ORAL

## 2023-11-28 MED ORDER — DIPHENHYDRAMINE HCL 25 MG PO CAPS
25.0000 mg | ORAL_CAPSULE | Freq: Once | ORAL | Status: AC
  Administered 2023-11-28: 25 mg via ORAL

## 2023-11-28 NOTE — Progress Notes (Signed)
 Diagnosis: Iron  Deficiency Anemia  Provider:  Suzen Fetters, CNM  Procedure: IV Infusion  IV Type: Peripheral, IV Location: R Antecubital  Venofer  (Iron  Sucrose), Dose: 500 mg  Infusion Start Time: 1010  Infusion Stop Time: 1435  Post Infusion IV Care: Observation period completed  Discharge: Condition: Good, Destination: Home . AVS Provided  Performed by:  Baldwin Darice Helling, RN

## 2023-12-05 ENCOUNTER — Ambulatory Visit: Payer: PRIVATE HEALTH INSURANCE | Admitting: Women's Health

## 2023-12-05 ENCOUNTER — Encounter: Payer: Self-pay | Admitting: Women's Health

## 2023-12-05 VITALS — BP 115/75 | HR 99 | Wt 270.0 lb

## 2023-12-05 DIAGNOSIS — Z3483 Encounter for supervision of other normal pregnancy, third trimester: Secondary | ICD-10-CM | POA: Diagnosis not present

## 2023-12-05 DIAGNOSIS — Z3480 Encounter for supervision of other normal pregnancy, unspecified trimester: Secondary | ICD-10-CM

## 2023-12-05 DIAGNOSIS — Z3A34 34 weeks gestation of pregnancy: Secondary | ICD-10-CM

## 2023-12-05 NOTE — Progress Notes (Signed)
 LOW-RISK PREGNANCY VISIT Patient name: April Tyler MRN [id number]  Date of birth: 1990-09-02 Chief Complaint:   Routine Prenatal Visit  History of Present Illness:   April Tyler is a 33 y.o. [id number] female at [redacted]w[redacted]d with an Estimated Date of Delivery: 01/16/24 being seen today for ongoing management of a low-risk pregnancy.   Today she reports had sinus/ear infection last week, finished antbiotics, Lt ear started to hurt again/Lt sided headache. Contractions: Irritability.  .  Movement: Present. denies leaking of fluid.     10/24/2023   10:09 AM 07/03/2023    3:04 PM 05/23/2023    3:36 PM 09/23/2021    3:10 PM 05/27/2020    4:33 PM  Depression screen PHQ 2/9  Decreased Interest 0 0 1 0 1  Down, Depressed, Hopeless 0 0 0 0 1  PHQ - 2 Score 0 0 1 0 2  Altered sleeping 0 0 2 0 0  Tired, decreased energy 1 0 1 2 3   Change in appetite 0 0 2 0 1  Feeling bad or failure about yourself  0 0 0 0 0  Trouble concentrating 0 0 1 0 1  Moving slowly or fidgety/restless 0 0 0 0 0  Suicidal thoughts 0 0 0 0 0  PHQ-9 Score 1 0 7 2 7   Difficult doing work/chores Not difficult at all   Not difficult at all Not difficult at all        07/03/2023    3:04 PM 05/23/2023    3:36 PM 09/23/2021    3:22 PM  GAD 7 : Generalized Anxiety Score  Nervous, Anxious, on Edge 0 1 2  Control/stop worrying 0 0 3  Worry too much - different things 0 1 3  Trouble relaxing 0 0 0  Restless 0 0 0  Easily annoyed or irritable 0 0 2  Afraid - awful might happen 0 0 2  Total GAD 7 Score 0 2 12      Review of Systems:   Pertinent items are noted in HPI Denies abnormal vaginal discharge w/ itching/odor/irritation, headaches, visual changes, shortness of breath, chest pain, abdominal pain, severe nausea/vomiting, or problems with urination or bowel movements unless otherwise stated above. Pertinent History Reviewed:  Reviewed past medical,surgical, social, obstetrical and family history.  Reviewed problem list,  medications and allergies. Physical Assessment:   Vitals:   12/05/23 1619  BP: 115/75  Pulse: 99  Weight: 270 lb (122.5 kg)  Body mass index is 41.05 kg/m.        Physical Examination:   General appearance: Well appearing, and in no distress  Mental status: Alert, oriented to person, place, and time  Skin: Warm & dry  Cardiovascular: Normal heart rate noted  Respiratory: Normal respiratory effort, no distress  Ears: bilateral TM non-erythematous, normal TM  Abdomen: Soft, gravid, nontender  Pelvic: Cervical exam deferred         Extremities: Edema: None  Fetal Status: Fetal Heart Rate (bpm): 152 Fundal Height: 34 cm Movement: Present    Chaperone: N/A No results found for this or any previous visit (from the past 24 hours).  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  Labs/procedures today: none  Plan:  Continue routine obstetrical care  Next visit: prefers will be in person for cultures    Reviewed: Preterm labor symptoms and general obstetric precautions including but not limited to vaginal bleeding, contractions, leaking of fluid and fetal movement were reviewed in detail with the patient.  All questions were answered. Does have home bp cuff. Office bp cuff given: not applicable. Check bp weekly, let us  know if consistently >140 and/or >90.  Follow-up: Return for As scheduled.  Future Appointments  Date Time Provider Department Center  12/13/2023  9:00 AM [facility] None  12/19/2023  4:10 PM Kizzie Suzen SAUNDERS, CNM [facility]    No orders of the  defined types were placed in this encounter.  Suzen SAUNDERS Kizzie CNM, [facility] 12/05/2023 4:36 PM

## 2023-12-05 NOTE — Patient Instructions (Signed)
 April Tyler, thank you for choosing our office today! We appreciate the opportunity to meet your healthcare needs. You may receive a short survey by mail, e-mail, or through [employer]. If you are happy with your care we would appreciate if you could take just a few minutes to complete the survey questions. We read all of your comments and take your feedback very seriously. Thank you again for choosing our office.  Center for [employer] Team at [facility]  [facility] ([address] [place], [place] [address]) Entrance C, located off of [address] Free 24/7 valet parking   CLASSES: Go to [email or web address] to register for classes (childbirth, breastfeeding, waterbirth, infant CPR, daddy bootcamp, etc.)  Call the office [phone]) or go to [facility] if: You begin to have strong, frequent contractions Your water breaks.  Sometimes it is a big gush of fluid, sometimes it is just a trickle that keeps getting your panties wet or running down your legs You have vaginal bleeding.  It is normal to have a small amount of spotting if your cervix was checked.  You don't feel your baby moving like normal.  If you don't, get you something to eat and drink and lay down and focus on feeling your baby move.   If your baby is still not moving like normal, you should call the office or go to [facility].  Call the office [phone]) or go to [facility] hospital for these signs of pre-eclampsia: Severe headache that does not go away with Tylenol  Visual changes- seeing spots, double, blurred vision Pain under your right breast or upper abdomen that does not go away with Tums or heartburn medicine Nausea and/or vomiting Severe swelling in your hands, feet, and face   Tdap Vaccine It is recommended that you get the Tdap vaccine during the third trimester of EACH pregnancy to help protect your baby from getting pertussis (whooping cough) 27-36 weeks is the BEST time to do  this so that you can pass the protection on to your baby. During pregnancy is better than after pregnancy, but if you are unable to get it during pregnancy it will be offered at the hospital.  You can get this vaccine with us , at the health department, your family doctor, or some local pharmacies Everyone who will be around your baby should also be up-to-date on their vaccines before the baby comes. Adults (who are not pregnant) only need 1 dose of Tdap during adulthood.   [facility] Pediatricians/Family Doctors  [facility]): [address] Meg Spina, [phone]           [facility]: [address], [phone]                [facility]): [address], [phone] (call to ask if accepting patients) [facility]: [address], [place], [phone]    [facility] Pediatricians/Family Doctors [facility]): [phone], [phone] [facility]: [address] [place], [phone] [facility]: [address], [phone]  [facility] Doctors  [facility]): [phone] [facility]: [address] [phone]   [facility] Doctors [facility]: [address], [phone]  [facility] Family Doctors  [facility]: [phone], [phone]  Home Blood Pressure Monitoring for Patients   Your provider has recommended that you check your  blood pressure (BP) at least once a week at home. If you do not have a blood pressure cuff at home, one will be provided for you. Contact your provider if you have not received your monitor within 1 week.   Helpful Tips for Accurate Home Blood Pressure Checks  Don't smoke, exercise, or drink caffeine 30 minutes before checking your BP Use the restroom before checking your BP (a full bladder can raise your  pressure) Relax in a comfortable upright chair Feet on the ground Left arm resting comfortably on a flat surface at the level of your heart Legs uncrossed Back supported Sit quietly and don't talk Place the cuff on your bare arm Adjust snuggly, so that only two fingertips can fit between your skin and the top of the cuff Check 2 readings separated by at least one minute Keep a log of your BP readings For a visual, please reference this diagram: http://ccnc.care/bpdiagram  Provider Name: [facility]     Phone: [phone]  Zone 1: ALL CLEAR  Continue to monitor your symptoms:  BP reading is less than 140 (top number) or less than 90 (bottom number)  No right upper stomach pain No headaches or seeing spots No feeling nauseated or throwing up No swelling in face and hands  Zone 2: CAUTION Call your doctor's office for any of the following:  BP reading is greater than 140 (top number) or greater than 90 (bottom number)  Stomach pain under your ribs in the middle or right side Headaches or seeing spots Feeling nauseated or throwing up Swelling in face and hands  Zone 3: EMERGENCY  Seek immediate medical care if you have any of the following:  BP reading is greater than160 (top number) or greater than 110 (bottom number) Severe headaches not improving with Tylenol  Serious difficulty catching your breath Any worsening symptoms from Zone 2  Preterm Labor and Birth Information  The normal length of a pregnancy is 39-41 weeks. Preterm labor is when labor starts before 37 completed weeks of pregnancy. What are the risk factors for preterm labor? Preterm labor is more likely to occur in women who: Have certain infections during pregnancy such as a bladder infection, sexually transmitted infection, or infection inside the uterus (chorioamnionitis). Have a shorter-than-normal cervix. Have gone into preterm labor before. Have had surgery on their cervix. Are younger than age 54  or older than age 80. Are African American. Are pregnant with twins or multiple babies (multiple gestation). Take street drugs or smoke while pregnant. Do not gain enough weight while pregnant. Became pregnant shortly after having been pregnant. What are the symptoms of preterm labor? Symptoms of preterm labor include: Cramps similar to those that can happen during a menstrual period. The cramps may happen with diarrhea. Pain in the abdomen or lower back. Regular uterine contractions that may feel like tightening of the abdomen. A feeling of increased pressure in the pelvis. Increased watery or bloody mucus discharge from the vagina. Water breaking (ruptured amniotic sac). Why is it important to recognize signs of preterm labor? It is important to recognize signs of preterm labor because babies who are born prematurely may not be fully developed. This can put them at an increased risk for: Long-term (chronic) heart and lung problems. Difficulty immediately after birth with regulating body systems, including blood sugar, body temperature, heart rate, and breathing rate. Bleeding in the brain. Cerebral palsy. Learning difficulties. Death. These risks are highest for babies who are born before 34 weeks  of pregnancy. How is preterm labor treated? Treatment depends on the length of your pregnancy, your condition, and the health of your baby. It may involve: Having a stitch (suture) placed in your cervix to prevent your cervix from opening too early (cerclage). Taking or being given medicines, such as: Hormone medicines. These may be given early in pregnancy to help support the pregnancy. Medicine to stop contractions. Medicines to help mature the baby's lungs. These may be prescribed if the risk of delivery is high. Medicines to prevent your baby from developing cerebral palsy. If the labor happens before 34 weeks of pregnancy, you may need to stay in the hospital. What should I do if I  think I am in preterm labor? If you think that you are going into preterm labor, call your health care provider right away. How can I prevent preterm labor in future pregnancies? To increase your chance of having a full-term pregnancy: Do not use any tobacco products, such as cigarettes, chewing tobacco, and e-cigarettes. If you need help quitting, ask your health care provider. Do not use street drugs or medicines that have not been prescribed to you during your pregnancy. Talk with your health care provider before taking any herbal supplements, even if you have been taking them regularly. Make sure you gain a healthy amount of weight during your pregnancy. Watch for infection. If you think that you might have an infection, get it checked right away. Make sure to tell your health care provider if you have gone into preterm labor before. This information is not intended to replace advice given to you by your health care provider. Make sure you discuss any questions you have with your health care provider. Document Revised: 08/31/2018 Document Reviewed: 09/30/2015 Elsevier Patient Education  2020 [employer].

## 2023-12-06 ENCOUNTER — Ambulatory Visit: Payer: PRIVATE HEALTH INSURANCE

## 2023-12-13 ENCOUNTER — Encounter: Payer: PRIVATE HEALTH INSURANCE | Admitting: Emergency Medicine

## 2023-12-13 VITALS — BP 109/68 | HR 78 | Temp 98.5°F | Resp 16

## 2023-12-13 DIAGNOSIS — O99013 Anemia complicating pregnancy, third trimester: Secondary | ICD-10-CM

## 2023-12-13 DIAGNOSIS — D649 Anemia, unspecified: Secondary | ICD-10-CM

## 2023-12-13 DIAGNOSIS — Z3A35 35 weeks gestation of pregnancy: Secondary | ICD-10-CM

## 2023-12-13 MED ORDER — SODIUM CHLORIDE 0.9 % IV SOLN
500.0000 mg | Freq: Once | INTRAVENOUS | Status: AC
  Administered 2023-12-13: 500 mg via INTRAVENOUS
  Filled 2023-12-13: qty 25

## 2023-12-13 MED ORDER — DIPHENHYDRAMINE HCL 25 MG PO CAPS
25.0000 mg | ORAL_CAPSULE | Freq: Once | ORAL | Status: AC
  Administered 2023-12-13: 25 mg via ORAL

## 2023-12-13 MED ORDER — ACETAMINOPHEN 325 MG PO TABS
650.0000 mg | ORAL_TABLET | Freq: Once | ORAL | Status: AC
  Administered 2023-12-13: 650 mg via ORAL

## 2023-12-13 NOTE — Progress Notes (Signed)
 Diagnosis:  Anemia affecting pregnancy in third trimester    Provider:  Kizzie Cramp   Procedure: IV Infusion  IV Type: Peripheral, IV Location: L Antecubital  Venofer  (Iron  Sucrose), Dose: 500 mg  Infusion Start Time: 0954  Infusion Stop Time: 1400  Post Infusion IV Care: Observation period completed and Peripheral IV Discontinued  Discharge: Condition: Good, Destination: Home . AVS Declined  Performed by:  Delon ONEIDA Officer, RN

## 2023-12-19 ENCOUNTER — Encounter: Payer: Self-pay | Admitting: Women's Health

## 2023-12-19 ENCOUNTER — Other Ambulatory Visit (HOSPITAL_COMMUNITY)
Admission: RE | Admit: 2023-12-19 | Discharge: 2023-12-19 | Disposition: A | Discharge status: Home / Self Care | Payer: PRIVATE HEALTH INSURANCE | Source: Ambulatory Visit | Attending: Women's Health | Admitting: Women's Health

## 2023-12-19 ENCOUNTER — Ambulatory Visit (INDEPENDENT_AMBULATORY_CARE_PROVIDER_SITE_OTHER): Payer: PRIVATE HEALTH INSURANCE | Admitting: Women's Health

## 2023-12-19 VITALS — BP 119/79 | HR 87 | Wt 272.0 lb

## 2023-12-19 DIAGNOSIS — Z3483 Encounter for supervision of other normal pregnancy, third trimester: Secondary | ICD-10-CM | POA: Insufficient documentation

## 2023-12-19 DIAGNOSIS — Z3A36 36 weeks gestation of pregnancy: Secondary | ICD-10-CM

## 2023-12-19 DIAGNOSIS — Z3480 Encounter for supervision of other normal pregnancy, unspecified trimester: Secondary | ICD-10-CM

## 2023-12-19 NOTE — Progress Notes (Signed)
 LOW-RISK PREGNANCY VISIT Patient name: April Tyler MRN [id number]  Date of birth: [birth year] Chief Complaint:   Routine Prenatal Visit (culture)  History of Present Illness:   April Tyler is a 33 y.o. [id number] female at [redacted]w[redacted]d with an Estimated Date of Delivery: 01/16/24 being seen today for ongoing management of a low-risk pregnancy.   Today she reports no complaints. Contractions: Not present.  .  Movement: Present. denies leaking of fluid.     10/24/2023   10:09 AM 07/03/2023    3:04 PM 05/23/2023    3:36 PM 09/23/2021    3:10 PM 05/27/2020    4:33 PM  Depression screen PHQ 2/9  Decreased Interest 0 0 1 0 1  Down, Depressed, Hopeless 0 0 0 0 1  PHQ - 2 Score 0 0 1 0 2  Altered sleeping 0 0 2 0 0  Tired, decreased energy 1 0 1 2 3   Change in appetite 0 0 2 0 1  Feeling bad or failure about yourself  0 0 0 0 0  Trouble concentrating 0 0 1 0 1  Moving slowly or fidgety/restless 0 0 0 0 0  Suicidal thoughts 0 0 0 0 0  PHQ-9 Score 1 0 7 2 7   Difficult doing work/chores Not difficult at all   Not difficult at all Not difficult at all        07/03/2023    3:04 PM 05/23/2023    3:36 PM 09/23/2021    3:22 PM  GAD 7 : Generalized Anxiety Score  Nervous, Anxious, on Edge 0 1 2  Control/stop worrying 0 0 3  Worry too much - different things 0 1 3  Trouble relaxing 0 0 0  Restless 0 0 0  Easily annoyed or irritable 0 0 2  Afraid - awful might happen 0 0 2  Total GAD 7 Score 0 2 12      Review of Systems:   Pertinent items are noted in HPI Denies abnormal vaginal discharge w/ itching/odor/irritation, headaches, visual changes, shortness of breath, chest pain, abdominal pain, severe nausea/vomiting, or problems with urination or bowel movements unless otherwise stated above. Pertinent History Reviewed:  Reviewed past medical,surgical, social, obstetrical and family history.  Reviewed problem list, medications and allergies. Physical Assessment:   Vitals:   12/19/23 1622   BP: 119/79  Pulse: 87  Weight: 272 lb (123.4 kg)  Body mass index is 41.36 kg/m.        Physical Examination:   General appearance: Well appearing, and in no distress  Mental status: Alert, oriented to person, place, and time  Skin: Warm & dry  Cardiovascular: Normal heart rate noted  Respiratory: Normal respiratory effort, no distress  Abdomen: Soft, gravid, nontender  Pelvic: Cervical exam performed  Dilation: 1 Effacement (%): 50 Station: Ballotable  Extremities: Edema: Trace  Fetal Status: Fetal Heart Rate (bpm): 154 Fundal Height: 37 cm Movement: Present Presentation: Vertex  Chaperone: Peggy Dones No results found for this or any previous visit (from the past 24 hours).  Assessment & Plan:  1) Low-risk pregnancy [id number] at [redacted]w[redacted]d with an Estimated Date of Delivery: 01/16/24   2) Anemia, s/p IV Fe  3) H/O PPHTN> ASA   Meds: No orders of the defined types were placed in this encounter.  Labs/procedures today: GBS, GC/CT, and SVE  Plan:  Continue routine obstetrical care  Next visit: prefers in person    Reviewed: Preterm labor symptoms and general obstetric precautions including but  not limited to vaginal bleeding, contractions, leaking of fluid and fetal movement were reviewed in detail with the patient.  All questions were answered. Does have home bp cuff. Office bp cuff given: not applicable. Check bp weekly, let us  know if consistently >140 and/or >90.  Follow-up: Return for LROB, weekly, CNM, in person.  No future appointments.  Orders Placed This Encounter  Procedures   Culture, beta strep (group b only)   Suzen JONELLE Fetters CNM, [facility] 12/19/2023 4:44 PM

## 2023-12-19 NOTE — Patient Instructions (Signed)
 April Tyler, thank you for choosing our office today! We appreciate the opportunity to meet your healthcare needs. You may receive a short survey by mail, e-mail, or through [employer]. If you are happy with your care we would appreciate if you could take just a few minutes to complete the survey questions. We read all of your comments and take your feedback very seriously. Thank you again for choosing our office.  Center for [employer] Team at [facility]  [facility] ([address] [place], [place] [address]) Entrance C, located off of [address] Free 24/7 valet parking   CLASSES: Go to [email or web address] to register for classes (childbirth, breastfeeding, waterbirth, infant CPR, daddy bootcamp, etc.)  Call the office [phone]) or go to [facility] if: You begin to have strong, frequent contractions Your water breaks.  Sometimes it is a big gush of fluid, sometimes it is just a trickle that keeps getting your panties wet or running down your legs You have vaginal bleeding.  It is normal to have a small amount of spotting if your cervix was checked.  You don't feel your baby moving like normal.  If you don't, get you something to eat and drink and lay down and focus on feeling your baby move.   If your baby is still not moving like normal, you should call the office or go to [facility].  Call the office [phone]) or go to [facility] hospital for these signs of pre-eclampsia: Severe headache that does not go away with Tylenol  Visual changes- seeing spots, double, blurred vision Pain under your right breast or upper abdomen that does not go away with Tums or heartburn medicine Nausea and/or vomiting Severe swelling in your hands, feet, and face   Tdap Vaccine It is recommended that you get the Tdap vaccine during the third trimester of EACH pregnancy to help protect your baby from getting pertussis (whooping cough) 27-36 weeks is the BEST time to do  this so that you can pass the protection on to your baby. During pregnancy is better than after pregnancy, but if you are unable to get it during pregnancy it will be offered at the hospital.  You can get this vaccine with us , at the health department, your family doctor, or some local pharmacies Everyone who will be around your baby should also be up-to-date on their vaccines before the baby comes. Adults (who are not pregnant) only need 1 dose of Tdap during adulthood.   [facility] Pediatricians/Family Doctors  [facility]): [address] Meg Spina, [phone]           [facility]: [address], [phone]                [facility]): [address], [phone] (call to ask if accepting patients) [facility]: [address], [place], [phone]    [facility] Pediatricians/Family Doctors [facility]): [phone], [phone] [facility]: [address] [place], [phone] [facility]: [address], [phone]  [facility] Doctors  [facility]): [phone] [facility]: [address] [phone]   [facility] Doctors [facility]: [address], [phone]  [facility] Family Doctors  [facility]: [phone], [phone]  Home Blood Pressure Monitoring for Patients   Your provider has recommended that you check your  blood pressure (BP) at least once a week at home. If you do not have a blood pressure cuff at home, one will be provided for you. Contact your provider if you have not received your monitor within 1 week.   Helpful Tips for Accurate Home Blood Pressure Checks  Don't smoke, exercise, or drink caffeine 30 minutes before checking your BP Use the restroom before checking your BP (a full bladder can raise your  pressure) Relax in a comfortable upright chair Feet on the ground Left arm resting comfortably on a flat surface at the level of your heart Legs uncrossed Back supported Sit quietly and don't talk Place the cuff on your bare arm Adjust snuggly, so that only two fingertips can fit between your skin and the top of the cuff Check 2 readings separated by at least one minute Keep a log of your BP readings For a visual, please reference this diagram: http://ccnc.care/bpdiagram  Provider Name: [facility]     Phone: [phone]  Zone 1: ALL CLEAR  Continue to monitor your symptoms:  BP reading is less than 140 (top number) or less than 90 (bottom number)  No right upper stomach pain No headaches or seeing spots No feeling nauseated or throwing up No swelling in face and hands  Zone 2: CAUTION Call your doctor's office for any of the following:  BP reading is greater than 140 (top number) or greater than 90 (bottom number)  Stomach pain under your ribs in the middle or right side Headaches or seeing spots Feeling nauseated or throwing up Swelling in face and hands  Zone 3: EMERGENCY  Seek immediate medical care if you have any of the following:  BP reading is greater than160 (top number) or greater than 110 (bottom number) Severe headaches not improving with Tylenol  Serious difficulty catching your breath Any worsening symptoms from Zone 2  Preterm Labor and Birth Information  The normal length of a pregnancy is 39-41 weeks. Preterm labor is when labor starts before 37 completed weeks of pregnancy. What are the risk factors for preterm labor? Preterm labor is more likely to occur in women who: Have certain infections during pregnancy such as a bladder infection, sexually transmitted infection, or infection inside the uterus (chorioamnionitis). Have a shorter-than-normal cervix. Have gone into preterm labor before. Have had surgery on their cervix. Are younger than age 54  or older than age 80. Are African American. Are pregnant with twins or multiple babies (multiple gestation). Take street drugs or smoke while pregnant. Do not gain enough weight while pregnant. Became pregnant shortly after having been pregnant. What are the symptoms of preterm labor? Symptoms of preterm labor include: Cramps similar to those that can happen during a menstrual period. The cramps may happen with diarrhea. Pain in the abdomen or lower back. Regular uterine contractions that may feel like tightening of the abdomen. A feeling of increased pressure in the pelvis. Increased watery or bloody mucus discharge from the vagina. Water breaking (ruptured amniotic sac). Why is it important to recognize signs of preterm labor? It is important to recognize signs of preterm labor because babies who are born prematurely may not be fully developed. This can put them at an increased risk for: Long-term (chronic) heart and lung problems. Difficulty immediately after birth with regulating body systems, including blood sugar, body temperature, heart rate, and breathing rate. Bleeding in the brain. Cerebral palsy. Learning difficulties. Death. These risks are highest for babies who are born before 34 weeks  of pregnancy. How is preterm labor treated? Treatment depends on the length of your pregnancy, your condition, and the health of your baby. It may involve: Having a stitch (suture) placed in your cervix to prevent your cervix from opening too early (cerclage). Taking or being given medicines, such as: Hormone medicines. These may be given early in pregnancy to help support the pregnancy. Medicine to stop contractions. Medicines to help mature the baby's lungs. These may be prescribed if the risk of delivery is high. Medicines to prevent your baby from developing cerebral palsy. If the labor happens before 34 weeks of pregnancy, you may need to stay in the hospital. What should I do if I  think I am in preterm labor? If you think that you are going into preterm labor, call your health care provider right away. How can I prevent preterm labor in future pregnancies? To increase your chance of having a full-term pregnancy: Do not use any tobacco products, such as cigarettes, chewing tobacco, and e-cigarettes. If you need help quitting, ask your health care provider. Do not use street drugs or medicines that have not been prescribed to you during your pregnancy. Talk with your health care provider before taking any herbal supplements, even if you have been taking them regularly. Make sure you gain a healthy amount of weight during your pregnancy. Watch for infection. If you think that you might have an infection, get it checked right away. Make sure to tell your health care provider if you have gone into preterm labor before. This information is not intended to replace advice given to you by your health care provider. Make sure you discuss any questions you have with your health care provider. Document Revised: 08/31/2018 Document Reviewed: 09/30/2015 Elsevier Patient Education  2020 [employer].

## 2023-12-21 LAB — CERVICOVAGINAL ANCILLARY ONLY
Chlamydia: NEGATIVE
Comment: NEGATIVE
Comment: NORMAL
Neisseria Gonorrhea: NEGATIVE

## 2023-12-22 ENCOUNTER — Ambulatory Visit: Payer: Self-pay | Admitting: Adult Health

## 2023-12-22 DIAGNOSIS — Z3480 Encounter for supervision of other normal pregnancy, unspecified trimester: Secondary | ICD-10-CM

## 2023-12-24 LAB — CULTURE, BETA STREP (GROUP B ONLY)

## 2023-12-26 ENCOUNTER — Ambulatory Visit: Payer: PRIVATE HEALTH INSURANCE | Admitting: Obstetrics and Gynecology

## 2023-12-26 VITALS — BP 124/81 | HR 97 | Wt 278.0 lb

## 2023-12-26 DIAGNOSIS — Z3480 Encounter for supervision of other normal pregnancy, unspecified trimester: Secondary | ICD-10-CM

## 2023-12-26 DIAGNOSIS — Z8759 Personal history of other complications of pregnancy, childbirth and the puerperium: Secondary | ICD-10-CM

## 2023-12-26 DIAGNOSIS — O99013 Anemia complicating pregnancy, third trimester: Secondary | ICD-10-CM

## 2023-12-26 DIAGNOSIS — Z3A37 37 weeks gestation of pregnancy: Secondary | ICD-10-CM | POA: Diagnosis not present

## 2023-12-26 DIAGNOSIS — Z8679 Personal history of other diseases of the circulatory system: Secondary | ICD-10-CM

## 2023-12-26 NOTE — Progress Notes (Signed)
   PRENATAL VISIT NOTE  Subjective:  April Tyler is a 33 y.o. H1E5965 at [redacted]w[redacted]d being seen today for ongoing prenatal care.  She is currently monitored for the following issues for this low-risk pregnancy and has HSV-2 infection; Vitamin D  deficiency; Hyperlipidemia; Anxiety; Encounter for supervision of normal pregnancy, antepartum; History of postpartum hypertension; and Anemia affecting pregnancy on their problem list.  Patient reports swelling in feet, denies HA, vision changes .  Contractions: Irritability. Vag. Bleeding: None.  Movement: Present. Denies leaking of fluid.   The following portions of the patient's history were reviewed and updated as appropriate: allergies, current medications, past family history, past medical history, past social history, past surgical history and problem list.   Objective:    Vitals:   12/26/23 1326  BP: 124/81  Pulse: 97  Weight: 278 lb (126.1 kg)    Fetal Status:  Fetal Heart Rate (bpm): 143 Fundal Height: 38 cm Movement: Present    General: Alert, oriented and cooperative. Patient is in no acute distress.  Skin: Skin is warm and dry. No rash noted.   Cardiovascular: Normal heart rate noted  Respiratory: Normal respiratory effort, no problems with respiration noted  Abdomen: Soft, gravid, appropriate for gestational age.  Pain/Pressure: Present     Pelvic: Cervical exam deferred        Extremities: Normal range of motion.     Mental Status: Normal mood and affect. Normal behavior. Normal judgment and thought content.   Assessment and Plan:  Pregnancy: [id number] at [redacted]w[redacted]d 1. Supervision of other normal pregnancy, antepartum (Primary) BP and FHR normal Doing well, feeling regular movement    2. [redacted] weeks gestation of pregnancy Labor precautions discussed Monitor swelling, discussed rest/elevation, compression socks, precautions regarding BP and other PEC s&s  3. Anemia affecting pregnancy in third trimester S/p iv iron  last dose  7/23  4. History of postpartum hypertension Normotensive, continue ASA  Term labor symptoms and general obstetric precautions including but not limited to vaginal bleeding, contractions, leaking of fluid and fetal movement were reviewed in detail with the patient. Please refer to After Visit Summary for other counseling recommendations.   Return in about 1 week (around 01/02/2024) for OB VISIT (MD or APP).    Nidia Daring, FNP

## 2024-01-02 ENCOUNTER — Ambulatory Visit (INDEPENDENT_AMBULATORY_CARE_PROVIDER_SITE_OTHER): Payer: PRIVATE HEALTH INSURANCE | Admitting: Women's Health

## 2024-01-02 ENCOUNTER — Encounter: Payer: Self-pay | Admitting: Women's Health

## 2024-01-02 VITALS — BP 128/81 | HR 79 | Wt 279.2 lb

## 2024-01-02 DIAGNOSIS — Z3483 Encounter for supervision of other normal pregnancy, third trimester: Secondary | ICD-10-CM

## 2024-01-02 DIAGNOSIS — Z3A38 38 weeks gestation of pregnancy: Secondary | ICD-10-CM | POA: Diagnosis not present

## 2024-01-02 NOTE — Progress Notes (Signed)
 LOW-RISK PREGNANCY VISIT Patient name: April Tyler MRN [id number]  Date of birth: 06/19/1990 Chief Complaint:   Routine Prenatal Visit  History of Present Illness:   BAYLEN DEA is a 33 y.o. [id number] female at [redacted]w[redacted]d with an Estimated Date of Delivery: 01/16/24 being seen today for ongoing management of a low-risk pregnancy.   Today she reports occasional contractions. All home bp's wnl, checks daily. Denies ha, visual changes, ruq/epigastric pain, n/v. Did have a weird pain on Rt upper lateral side last night, went away.  Contractions: Irritability. Vag. Bleeding: None.  Movement: Present. denies leaking of fluid.     10/24/2023   10:09 AM 07/03/2023    3:04 PM 05/23/2023    3:36 PM 09/23/2021    3:10 PM 05/27/2020    4:33 PM  Depression screen PHQ 2/9  Decreased Interest 0 0 1 0 1  Down, Depressed, Hopeless 0 0 0 0 1  PHQ - 2 Score 0 0 1 0 2  Altered sleeping 0 0 2 0 0  Tired, decreased energy 1 0 1 2 3   Change in appetite 0 0 2 0 1  Feeling bad or failure about yourself  0 0 0 0 0  Trouble concentrating 0 0 1 0 1  Moving slowly or fidgety/restless 0 0 0 0 0  Suicidal thoughts 0 0 0 0 0  PHQ-9 Score 1 0 7 2 7   Difficult doing work/chores Not difficult at all   Not difficult at all Not difficult at all        07/03/2023    3:04 PM 05/23/2023    3:36 PM 09/23/2021    3:22 PM  GAD 7 : Generalized Anxiety Score  Nervous, Anxious, on Edge 0 1 2  Control/stop worrying 0 0 3  Worry too much - different things 0 1 3  Trouble relaxing 0 0 0  Restless 0 0 0  Easily annoyed or irritable 0 0 2  Afraid - awful might happen 0 0 2  Total GAD 7 Score 0 2 12      Review of Systems:   Pertinent items are noted in HPI Denies abnormal vaginal discharge w/ itching/odor/irritation, headaches, visual changes, shortness of breath, chest pain, abdominal pain, severe nausea/vomiting, or problems with urination or bowel movements unless otherwise stated above. Pertinent History Reviewed:   Reviewed past medical,surgical, social, obstetrical and family history.  Reviewed problem list, medications and allergies. Physical Assessment:   Vitals:   01/02/24 0910 01/02/24 0920  BP: (!) 149/88 128/81  Pulse: 86 79  Weight: 279 lb 3.2 oz (126.6 kg)   Body mass index is 42.45 kg/m.        Physical Examination:   General appearance: Well appearing, and in no distress  Mental status: Alert, oriented to person, place, and time  Skin: Warm & dry  Cardiovascular: Normal heart rate noted  Respiratory: Normal respiratory effort, no distress  Abdomen: Soft, gravid, nontender  Pelvic: Cervical exam performed  Dilation: 1.5 Effacement (%): 50 Station: -3  Extremities: Edema: Trace  Fetal Status: Fetal Heart Rate (bpm): 140 Fundal Height: 38 cm Movement: Present Presentation: Vertex  Chaperone: Alan Fischer No results found for this or any previous visit (from the past 24 hours).  Assessment & Plan:  1) Low-risk pregnancy [id number] at [redacted]w[redacted]d with an Estimated Date of Delivery: 01/16/24   2) Initially elevated bp w/ h/o PPHTN, normal repeat, daily home bp's wnl, asymptomatic, voided before bp check. Continue checking bp daily  at home, if >140/90 or pre-e s/s, let us  know. Continue ASA   Meds: No orders of the defined types were placed in this encounter.  Labs/procedures today: SVE  Plan:  Continue routine obstetrical care  Next visit: prefers in person    Reviewed: Term labor symptoms and general obstetric precautions including but not limited to vaginal bleeding, contractions, leaking of fluid and fetal movement were reviewed in detail with the patient.  All questions were answered. Does have home bp cuff. Office bp cuff given: not applicable. Check bp daily, let us  know if consistently >140 and/or >90.  Follow-up: Return for As scheduled.  Future Appointments  Date Time Provider Department Center  01/09/2024 11:10 AM Kizzie Suzen SAUNDERS, CNM [facility]  01/16/2024 11:50 AM  Kizzie Suzen SAUNDERS, CNM [facility]    No orders of the defined types were placed in this encounter.  Suzen SAUNDERS Kizzie CNM, [facility] 01/02/2024 9:38 AM

## 2024-01-02 NOTE — Patient Instructions (Signed)
 Kels[place], thank you for choosing our office today! We appreciate the opportunity to meet your healthcare needs. You may receive a short survey by mail, e-mail, or through [employer]. If you are happy with your care we would appreciate if you could take just a few minutes to complete the survey questions. We read all of your comments and take your feedback very seriously. Thank you again for choosing our office.  Center for [employer] Team at [facility]  [facility] at [facility] ([address] [place], [place] [address]) Entrance C, located off of [address] Free 24/7 valet parking   CLASSES: Go to [email or web address] to register for classes (childbirth, breastfeeding, waterbirth, infant CPR, daddy bootcamp, etc.)  Call the office [phone]) or go to [facility] if: You begin to have strong, frequent contractions Your water breaks.  Sometimes it is a big gush of fluid, sometimes it is just a trickle that keeps getting your panties wet or running down your legs You have vaginal bleeding.  It is normal to have a small amount of spotting if your cervix was checked.  You don't feel your baby moving like normal.  If you don't, get you something to eat and drink and lay down and focus on feeling your baby move.   If your baby is still not moving like normal, you should call the office or go to [facility].  Call the office [phone]) or go to [facility] hospital for these signs of pre-eclampsia: Severe headache that does not go away with Tylenol  Visual changes- seeing spots, double, blurred vision Pain under your right breast or upper abdomen that does not go away with Tums or heartburn medicine Nausea and/or vomiting Severe swelling in your hands, feet, and face   [place] [facility]ians/Family Doctors [facility]): [address] [name] BROCKS, [phone]           [facility]: [address], [phone]                 [facility]): [address], [phone] (call to ask if accepting patients) [facility] Department: [address], [place], [phone]    [facility] Pediatricians/Family Doctors [facility]): [address], [phone] [facility]: [address] [place], [phone] [facility]: [address], [phone]  [facility] Doctors  [facility]): [phone] [facility]: [address] [phone]   [facility] Doctors [facility]: [address] [phone]  [facility] Doctors  [facility]: [phone], [phone]  Home Blood Pressure Monitoring for Patients   Your provider has recommended that you check your blood pressure (BP) at least once a week at home. If you do not have a blood pressure cuff at home, one will be provided for you. Contact your provider if you have not received your monitor within 1 week.   Helpful Tips for Accurate Home Blood Pressure Checks  Don't smoke, exercise, or drink caffeine 30 minutes before checking your BP Use the restroom before checking your BP (a full bladder can raise your pressure) Relax in a comfortable upright chair Feet on the ground Left arm resting comfortably on a flat surface at the level of your heart Legs uncrossed Back supported Sit quietly and don't talk Place the cuff on your bare arm Adjust snuggly, so that only two fingertips  can fit between your skin and the top of the cuff Check 2 readings separated by at least one minute Keep a log of your BP readings For a visual, please reference this diagram: http://ccnc.care/bpdiagram  Provider Name: [facility]     Phone: [phone]  Zone 1: ALL CLEAR  Continue to monitor your symptoms:  BP reading is less than 140 (top number) or less than 90 (bottom number)  No right  upper stomach pain No headaches or seeing spots No feeling nauseated or throwing up No swelling in face and hands  Zone 2: CAUTION Call your doctor's office for any of the following:  BP reading is greater than 140 (top number) or greater than 90 (bottom number)  Stomach pain under your ribs in the middle or right side Headaches or seeing spots Feeling nauseated or throwing up Swelling in face and hands  Zone 3: EMERGENCY  Seek immediate medical care if you have any of the following:  BP reading is greater than160 (top number) or greater than 110 (bottom number) Severe headaches not improving with Tylenol  Serious difficulty catching your breath Any worsening symptoms from Zone 2   Braxton Hicks Contractions Contractions of the uterus can occur throughout pregnancy, but they are not always a sign that you are in labor. You may have practice contractions called Braxton Hicks contractions. These false labor contractions are sometimes confused with true labor. What are Darol Irving contractions? Braxton Hicks contractions are tightening movements that occur in the muscles of the uterus before labor. Unlike true labor contractions, these contractions do not result in opening (dilation) and thinning of the cervix. Toward the end of pregnancy (32-34 weeks), Braxton Hicks contractions can happen more often and may become stronger. These contractions are sometimes difficult to tell apart from true labor because they can be very uncomfortable. You should not feel embarrassed if you go to the hospital with false labor. Sometimes, the only way to tell if you are in true labor is for your health care provider to look for changes in the cervix. The health care provider will do a physical exam and may monitor your contractions. If you are not in true labor, the exam should show that your cervix is not dilating and your water has not broken. If there are no other health problems associated with your  pregnancy, it is completely safe for you to be sent home with false labor. You may continue to have Braxton Hicks contractions until you go into true labor. How to tell the difference between true labor and false labor True labor Contractions last 30-70 seconds. Contractions become very regular. Discomfort is usually felt in the top of the uterus, and it spreads to the lower abdomen and low back. Contractions do not go away with walking. Contractions usually become more intense and increase in frequency. The cervix dilates and gets thinner. False labor Contractions are usually shorter and not as strong as true labor contractions. Contractions are usually irregular. Contractions are often felt in the front of the lower abdomen and in the groin. Contractions may go away when you walk around or change positions while lying down. Contractions get weaker and are shorter-lasting as time goes on. The cervix usually does not dilate or become thin. Follow these instructions at home:  Take over-the-counter and prescription medicines only as told by your health care provider. Keep up with your usual exercises and follow other instructions from your health care provider. Eat and drink lightly if you think  you are going into labor. If Braxton Hicks contractions are making you uncomfortable: Change your position from lying down or resting to walking, or change from walking to resting. Sit and rest in a tub of warm water. Drink enough fluid to keep your urine pale yellow. Dehydration may cause these contractions. Do slow and deep breathing several times an hour. Keep all follow-up prenatal visits as told by your health care provider. This is important. Contact a health care provider if: You have a fever. You have continuous pain in your abdomen. Get help right away if: Your contractions become stronger, more regular, and closer together. You have fluid leaking or gushing from your vagina. You pass  blood-tinged mucus (bloody show). You have bleeding from your vagina. You have low back pain that you never had before. You feel your baby's head pushing down and causing pelvic pressure. Your baby is not moving inside you as much as it used to. Summary Contractions that occur before labor are called Braxton Hicks contractions, false labor, or practice contractions. Braxton Hicks contractions are usually shorter, weaker, farther apart, and less regular than true labor contractions. True labor contractions usually become progressively stronger and regular, and they become more frequent. Manage discomfort from [facility] contractions by changing position, resting in a warm bath, drinking plenty of water, or practicing deep breathing. This information is not intended to replace advice given to you by your health care provider. Make sure you discuss any questions you have with your health care provider. Document Revised: 04/21/2017 Document Reviewed: 09/22/2016 Elsevier Patient Education  2020 [employer].

## 2024-01-09 ENCOUNTER — Encounter: Payer: Self-pay | Admitting: Women's Health

## 2024-01-09 ENCOUNTER — Ambulatory Visit (INDEPENDENT_AMBULATORY_CARE_PROVIDER_SITE_OTHER): Payer: PRIVATE HEALTH INSURANCE | Admitting: Women's Health

## 2024-01-09 VITALS — BP 138/82 | HR 77 | Wt 281.0 lb

## 2024-01-09 DIAGNOSIS — Z3A39 39 weeks gestation of pregnancy: Secondary | ICD-10-CM

## 2024-01-09 DIAGNOSIS — Z3483 Encounter for supervision of other normal pregnancy, third trimester: Secondary | ICD-10-CM

## 2024-01-09 NOTE — Progress Notes (Signed)
 LOW-RISK PREGNANCY VISIT Patient name: April Tyler MRN [id number]  Date of birth: [birth year] Chief Complaint:   Routine Prenatal Visit  History of Present Illness:   April Tyler is a 33 y.o. [id number] female at [redacted]w[redacted]d with an Estimated Date of Delivery: 01/16/24 being seen today for ongoing management of a low-risk pregnancy.   Today she reports carpal tunnel symptoms. Checks bp daily at work, all wnl. Denies ha, visual changes, ruq/epigastric pain, n/v.   Contractions: Not present. Vag. Bleeding: None.  Movement: Present. denies leaking of fluid.     10/24/2023   10:09 AM 07/03/2023    3:04 PM 05/23/2023    3:36 PM 09/23/2021    3:10 PM 05/27/2020    4:33 PM  Depression screen PHQ 2/9  Decreased Interest 0 0 1 0 1  Down, Depressed, Hopeless 0 0 0 0 1  PHQ - 2 Score 0 0 1 0 2  Altered sleeping 0 0 2 0 0  Tired, decreased energy 1 0 1 2 3   Change in appetite 0 0 2 0 1  Feeling bad or failure about yourself  0 0 0 0 0  Trouble concentrating 0 0 1 0 1  Moving slowly or fidgety/restless 0 0 0 0 0  Suicidal thoughts 0 0 0 0 0  PHQ-9 Score 1 0 7 2 7   Difficult doing work/chores Not difficult at all   Not difficult at all Not difficult at all        07/03/2023    3:04 PM 05/23/2023    3:36 PM 09/23/2021    3:22 PM  GAD 7 : Generalized Anxiety Score  Nervous, Anxious, on Edge 0 1 2  Control/stop worrying 0 0 3  Worry too much - different things 0 1 3  Trouble relaxing 0 0 0  Restless 0 0 0  Easily annoyed or irritable 0 0 2  Afraid - awful might happen 0 0 2  Total GAD 7 Score 0 2 12      Review of Systems:   Pertinent items are noted in HPI Denies abnormal vaginal discharge w/ itching/odor/irritation, headaches, visual changes, shortness of breath, chest pain, abdominal pain, severe nausea/vomiting, or problems with urination or bowel movements unless otherwise stated above. Pertinent History Reviewed:  Reviewed past medical,surgical, social, obstetrical and family history.   Reviewed problem list, medications and allergies. Physical Assessment:   Vitals:   01/09/24 1115  BP: 138/82  Pulse: 77  Weight: 281 lb (127.5 kg)  Body mass index is 42.73 kg/m.        Physical Examination:   General appearance: Well appearing, and in no distress  Mental status: Alert, oriented to person, place, and time  Skin: Warm & dry  Cardiovascular: Normal heart rate noted  Respiratory: Normal respiratory effort, no distress  Abdomen: Soft, gravid, nontender  Pelvic: Cervical exam performed  Dilation: 1.5 Effacement (%): 50 Station: -3, declined membrane sweep  Extremities: Edema: Trace  Fetal Status: Fetal Heart Rate (bpm): 141 Fundal Height: 39 cm Movement: Present Presentation: Vertex  Chaperone: Alan Fischer No results found for this or any previous visit (from the past 24 hours).  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  Labs/procedures today: SVE  Plan:  Continue routine obstetrical care  Next visit: prefers will be in person for postdates NST    Reviewed: Term labor symptoms and general obstetric precautions including but not limited to vaginal bleeding, contractions, leaking of fluid and fetal movement were reviewed in detail with the patient.  All questions were answered. Does have home bp cuff. Office bp cuff given: not applicable. Check bp daily, let us  know if consistently >140 and/or >90, reviewed pre-e s/s, reasons to seek care.  Follow-up: Return for As scheduled, add NST.  Future Appointments  Date Time Provider Department Center  01/16/2024 11:50 AM April Tyler, CNM [facility]    No orders of the defined types were placed in this encounter.  Suzen Tyler April  CNM, [facility] 01/09/2024 11:27 AM

## 2024-01-09 NOTE — Patient Instructions (Signed)
 April Tyler, thank you for choosing our office today! We appreciate the opportunity to meet your healthcare needs. You may receive a short survey by mail, e-mail, or through [employer]. If you are happy with your care we would appreciate if you could take just a few minutes to complete the survey questions. We read all of your comments and take your feedback very seriously. Thank you again for choosing our office.  Center for [employer] Team at [facility]  [facility] at [facility] ([address] [place], [place] [address]) Entrance C, located off of [address] Free 24/7 valet parking   CLASSES: Go to [email or web address] to register for classes (childbirth, breastfeeding, waterbirth, infant CPR, daddy bootcamp, etc.)  Call the office [phone]) or go to [facility] if: You begin to have strong, frequent contractions Your water breaks.  Sometimes it is a big gush of fluid, sometimes it is just a trickle that keeps getting your panties wet or running down your legs You have vaginal bleeding.  It is normal to have a small amount of spotting if your cervix was checked.  You don't feel your baby moving like normal.  If you don't, get you something to eat and drink and lay down and focus on feeling your baby move.   If your baby is still not moving like normal, you should call the office or go to [facility].  Call the office [phone]) or go to [facility] hospital for these signs of pre-eclampsia: Severe headache that does not go away with Tylenol  Visual changes- seeing spots, double, blurred vision Pain under your right breast or upper abdomen that does not go away with Tums or heartburn medicine Nausea and/or vomiting Severe swelling in your hands, feet, and face   [place] Pediatricians/Family Doctors [facility]): [address] [name] BROCKS, [phone]           [facility]: [address], [phone]                 [facility]): [address], [phone] (call to ask if accepting patients) [facility] Department: [address], [place], [phone]    [facility] Pediatricians/Family Doctors [facility]): [address], [phone] [facility]: [address] [place], [phone] [facility]: [address], [phone]  [facility] Doctors  [facility]): [phone] [facility]: [address] [phone]   [facility] Doctors [facility]: [address] [phone]  [facility] Doctors  [facility]: [phone], [phone]  Home Blood Pressure Monitoring for Patients   Your provider has recommended that you check your blood pressure (BP) at least once a week at home. If you do not have a blood pressure cuff at home, one will be provided for you. Contact your provider if you have not received your monitor within 1 week.   Helpful Tips for Accurate Home Blood Pressure Checks  Don't smoke, exercise, or drink caffeine 30 minutes before checking your BP Use the restroom before checking your BP (a full bladder can raise your pressure) Relax in a comfortable upright chair Feet on the ground Left arm resting comfortably on a flat surface at the level of your heart Legs uncrossed Back supported Sit quietly and don't talk Place the cuff on your bare arm Adjust snuggly, so that only two fingertips  can fit between your skin and the top of the cuff Check 2 readings separated by at least one minute Keep a log of your BP readings For a visual, please reference this diagram: http://ccnc.care/bpdiagram  Provider Name: [facility]     Phone: [phone]  Zone 1: ALL CLEAR  Continue to monitor your symptoms:  BP reading is less than 140 (top number) or less than 90 (bottom number)  No right  upper stomach pain No headaches or seeing spots No feeling nauseated or throwing up No swelling in face and hands  Zone 2: CAUTION Call your doctor's office for any of the following:  BP reading is greater than 140 (top number) or greater than 90 (bottom number)  Stomach pain under your ribs in the middle or right side Headaches or seeing spots Feeling nauseated or throwing up Swelling in face and hands  Zone 3: EMERGENCY  Seek immediate medical care if you have any of the following:  BP reading is greater than160 (top number) or greater than 110 (bottom number) Severe headaches not improving with Tylenol  Serious difficulty catching your breath Any worsening symptoms from Zone 2   Braxton Hicks Contractions Contractions of the uterus can occur throughout pregnancy, but they are not always a sign that you are in labor. You may have practice contractions called Braxton Hicks contractions. These false labor contractions are sometimes confused with true labor. What are Darol Irving contractions? Braxton Hicks contractions are tightening movements that occur in the muscles of the uterus before labor. Unlike true labor contractions, these contractions do not result in opening (dilation) and thinning of the cervix. Toward the end of pregnancy (32-34 weeks), Braxton Hicks contractions can happen more often and may become stronger. These contractions are sometimes difficult to tell apart from true labor because they can be very uncomfortable. You should not feel embarrassed if you go to the hospital with false labor. Sometimes, the only way to tell if you are in true labor is for your health care provider to look for changes in the cervix. The health care provider will do a physical exam and may monitor your contractions. If you are not in true labor, the exam should show that your cervix is not dilating and your water has not broken. If there are no other health problems associated with your  pregnancy, it is completely safe for you to be sent home with false labor. You may continue to have Braxton Hicks contractions until you go into true labor. How to tell the difference between true labor and false labor True labor Contractions last 30-70 seconds. Contractions become very regular. Discomfort is usually felt in the top of the uterus, and it spreads to the lower abdomen and low back. Contractions do not go away with walking. Contractions usually become more intense and increase in frequency. The cervix dilates and gets thinner. False labor Contractions are usually shorter and not as strong as true labor contractions. Contractions are usually irregular. Contractions are often felt in the front of the lower abdomen and in the groin. Contractions may go away when you walk around or change positions while lying down. Contractions get weaker and are shorter-lasting as time goes on. The cervix usually does not dilate or become thin. Follow these instructions at home:  Take over-the-counter and prescription medicines only as told by your health care provider. Keep up with your usual exercises and follow other instructions from your health care provider. Eat and drink lightly if you think  you are going into labor. If Braxton Hicks contractions are making you uncomfortable: Change your position from lying down or resting to walking, or change from walking to resting. Sit and rest in a tub of warm water. Drink enough fluid to keep your urine pale yellow. Dehydration may cause these contractions. Do slow and deep breathing several times an hour. Keep all follow-up prenatal visits as told by your health care provider. This is important. Contact a health care provider if: You have a fever. You have continuous pain in your abdomen. Get help right away if: Your contractions become stronger, more regular, and closer together. You have fluid leaking or gushing from your vagina. You pass  blood-tinged mucus (bloody show). You have bleeding from your vagina. You have low back pain that you never had before. You feel your baby's head pushing down and causing pelvic pressure. Your baby is not moving inside you as much as it used to. Summary Contractions that occur before labor are called Braxton Hicks contractions, false labor, or practice contractions. Braxton Hicks contractions are usually shorter, weaker, farther apart, and less regular than true labor contractions. True labor contractions usually become progressively stronger and regular, and they become more frequent. Manage discomfort from [facility] contractions by changing position, resting in a warm bath, drinking plenty of water, or practicing deep breathing. This information is not intended to replace advice given to you by your health care provider. Make sure you discuss any questions you have with your health care provider. Document Revised: 04/21/2017 Document Reviewed: 09/22/2016 Elsevier Patient Education  2020 [employer].

## 2024-01-10 ENCOUNTER — Encounter: Payer: Self-pay | Admitting: *Deleted

## 2024-01-16 ENCOUNTER — Ambulatory Visit: Payer: PRIVATE HEALTH INSURANCE | Admitting: Women's Health

## 2024-01-16 ENCOUNTER — Encounter: Payer: Self-pay | Admitting: Women's Health

## 2024-01-16 VITALS — BP 129/83 | HR 66 | Wt 283.0 lb

## 2024-01-16 DIAGNOSIS — Z3A40 40 weeks gestation of pregnancy: Secondary | ICD-10-CM | POA: Diagnosis not present

## 2024-01-16 DIAGNOSIS — Z3483 Encounter for supervision of other normal pregnancy, third trimester: Secondary | ICD-10-CM

## 2024-01-16 DIAGNOSIS — O480 Post-term pregnancy: Secondary | ICD-10-CM | POA: Diagnosis not present

## 2024-01-16 NOTE — Patient Instructions (Signed)
 Shayle, thank you for choosing our office today! We appreciate the opportunity to meet your healthcare needs. You may receive a short survey by mail, e-mail, or through [employer]. If you are happy with your care we would appreciate if you could take just a few minutes to complete the survey questions. We read all of your comments and take your feedback very seriously. Thank you again for choosing our office.  Center for [employer] Team at [facility]  [facility] at [facility] ([address] [place], [place] [address]) Entrance C, located off of [address] Free 24/7 valet parking   Your induction is scheduled for 9/3. Please DO NOT show up at the time you see in MyChart. Someone from Labor & Delivery will call you on the date of your induction to let you know what time to come in. Please keep your phone on and with you at all times, you have 1 hour to respond to them to let them know you are on your way.  Go to the main desk at the [facility] and let them know you are there to be induced. They will send someone from Labor & Delivery to come get you.  You will get a call from a nurse from the hospital within the next day or so to go over some information. If you have any questions, please let us  know.    CLASSES: Go to [email or web address] to register for classes (childbirth, breastfeeding, waterbirth, infant CPR, daddy bootcamp, etc.)  Call the office [phone]) or go to [facility] if: You begin to have strong, frequent contractions Your water breaks.  Sometimes it is a big gush of fluid, sometimes it is just a trickle that keeps getting your panties wet or running down your legs You have vaginal bleeding.  It is normal to have a small amount of spotting if your cervix was checked.  You don't feel your baby moving like normal.  If you don't, get you something to eat and drink and lay down and focus on feeling your baby move.   If your baby is still not  moving like normal, you should call the office or go to [facility]  Call the office [phone]) or go to [facility] hospital for these signs of pre-eclampsia: Severe headache that does not go away with Tylenol  Visual changes- seeing spots, double, blurred vision Pain under your right breast or upper abdomen that does not go away with Tums or heartburn medicine Nausea and/or vomiting Severe swelling in your hands, feet, and face   [place] Pediatricians/Family Doctors  [facility]): [address] [name] BROCKS, [phone]           [facility]: [address], [phone]                [facility]): [address], [phone] (call to ask if accepting patients) [facility] Department: [address], [place], [phone]    [facility] Pediatricians/Family Doctors [facility]): [address], [phone] [facility]: [address] [place], [phone] [facility]: [address], [phone]  [facility] Doctors  [facility]): [phone] [facility]: [address] [phone]   [facility] Doctors [facility]: [address], [phone]  [facility] Doctors  [facility]: [phone], [phone]  Home Blood  Pressure Monitoring for Patients   Your provider has recommended that you check your blood pressure (BP) at least once a week at home. If you do not have a blood pressure cuff at home, one will be provided for you. Contact your provider if you have not received your monitor within 1 week.   Helpful Tips for Accurate Home Blood Pressure Checks  Don't smoke, exercise, or drink caffeine 30 minutes before checking your BP Use the restroom before checking your BP (a full bladder can raise your pressure) Relax in a comfortable  upright chair Feet on the ground Left arm resting comfortably on a flat surface at the level of your heart Legs uncrossed Back supported Sit quietly and don't talk Place the cuff on your bare arm Adjust snuggly, so that only two fingertips can fit between your skin and the top of the cuff Check 2 readings separated by at least one minute Keep a log of your BP readings For a visual, please reference this diagram: http://ccnc.care/bpdiagram  Provider Name: [facility]     Phone: [phone]  Zone 1: ALL CLEAR  Continue to monitor your symptoms:  BP reading is less than 140 (top number) or less than 90 (bottom number)  No right upper stomach pain No headaches or seeing spots No feeling nauseated or throwing up No swelling in face and hands  Zone 2: CAUTION Call your doctor's office for any of the following:  BP reading is greater than 140 (top number) or greater than 90 (bottom number)  Stomach pain under your ribs in the middle or right side Headaches or seeing spots Feeling nauseated or throwing up Swelling in face and hands  Zone 3: EMERGENCY  Seek immediate medical care if you have any of the following:  BP reading is greater than160 (top number) or greater than 110 (bottom number) Severe headaches not improving with Tylenol  Serious difficulty catching your breath Any worsening symptoms from Zone 2   Braxton Hicks Contractions Contractions of the uterus can occur throughout pregnancy, but they are not always a sign that you are in labor. You may have practice contractions called Braxton Hicks contractions. These false labor contractions are sometimes confused with true labor. What are Darol Irving contractions? Braxton Hicks contractions are tightening movements that occur in the muscles of the uterus before labor. Unlike true labor contractions, these contractions do not result in opening (dilation) and thinning of the cervix. Toward the end of pregnancy (32-34  weeks), Braxton Hicks contractions can happen more often and may become stronger. These contractions are sometimes difficult to tell apart from true labor because they can be very uncomfortable. You should not feel embarrassed if you go to the hospital with false labor. Sometimes, the only way to tell if you are in true labor is for your health care provider to look for changes in the cervix. The health care provider will do a physical exam and may monitor your contractions. If you are not in true labor, the exam should show that your cervix is not dilating and your water has not broken. If there are no other health problems associated with your pregnancy, it is completely safe for you to be sent home with false labor. You may continue to have Braxton Hicks contractions until you go into true labor. How to tell the difference between true labor and false labor True labor Contractions last 30-70 seconds. Contractions become very regular. Discomfort is usually felt in the top of the uterus, and  it spreads to the lower abdomen and low back. Contractions do not go away with walking. Contractions usually become more intense and increase in frequency. The cervix dilates and gets thinner. False labor Contractions are usually shorter and not as strong as true labor contractions. Contractions are usually irregular. Contractions are often felt in the front of the lower abdomen and in the groin. Contractions may go away when you walk around or change positions while lying down. Contractions get weaker and are shorter-lasting as time goes on. The cervix usually does not dilate or become thin. Follow these instructions at home:  Take over-the-counter and prescription medicines only as told by your health care provider. Keep up with your usual exercises and follow other instructions from your health care provider. Eat and drink lightly if you think you are going into labor. If Braxton Hicks contractions are  making you uncomfortable: Change your position from lying down or resting to walking, or change from walking to resting. Sit and rest in a tub of warm water. Drink enough fluid to keep your urine pale yellow. Dehydration may cause these contractions. Do slow and deep breathing several times an hour. Keep all follow-up prenatal visits as told by your health care provider. This is important. Contact a health care provider if: You have a fever. You have continuous pain in your abdomen. Get help right away if: Your contractions become stronger, more regular, and closer together. You have fluid leaking or gushing from your vagina. You pass blood-tinged mucus (bloody show). You have bleeding from your vagina. You have low back pain that you never had before. You feel your baby's head pushing down and causing pelvic pressure. Your baby is not moving inside you as much as it used to. Summary Contractions that occur before labor are called Braxton Hicks contractions, false labor, or practice contractions. Braxton Hicks contractions are usually shorter, weaker, farther apart, and less regular than true labor contractions. True labor contractions usually become progressively stronger and regular, and they become more frequent. Manage discomfort from [facility] contractions by changing position, resting in a warm bath, drinking plenty of water, or practicing deep breathing. This information is not intended to replace advice given to you by your health care provider. Make sure you discuss any questions you have with your health care provider. Document Revised: 04/21/2017 Document Reviewed: 09/22/2016 Elsevier Patient Education  2020 [employer].

## 2024-01-16 NOTE — Progress Notes (Signed)
 LOW-RISK PREGNANCY VISIT Patient name: April Tyler MRN [id number]  Date of birth: [birth year] Chief Complaint:   Routine Prenatal Visit  History of Present Illness:   April Tyler is a 33 y.o. [id number] female at [redacted]w[redacted]d with an Estimated Date of Delivery: 01/16/24 being seen today for ongoing management of a low-risk pregnancy.   Today she reports no complaints. Contractions: Irritability. Vag. Bleeding: None.  Movement: Present. denies leaking of fluid.     10/24/2023   10:09 AM 07/03/2023    3:04 PM 05/23/2023    3:36 PM 09/23/2021    3:10 PM 05/27/2020    4:33 PM  Depression screen PHQ 2/9  Decreased Interest 0 0 1 0 1  Down, Depressed, Hopeless 0 0 0 0 1  PHQ - 2 Score 0 0 1 0 2  Altered sleeping 0 0 2 0 0  Tired, decreased energy 1 0 1 2 3   Change in appetite 0 0 2 0 1  Feeling bad or failure about yourself  0 0 0 0 0  Trouble concentrating 0 0 1 0 1  Moving slowly or fidgety/restless 0 0 0 0 0  Suicidal thoughts 0 0 0 0 0  PHQ-9 Score 1 0 7 2 7   Difficult doing work/chores Not difficult at all   Not difficult at all Not difficult at all        07/03/2023    3:04 PM 05/23/2023    3:36 PM 09/23/2021    3:22 PM  GAD 7 : Generalized Anxiety Score  Nervous, Anxious, on Edge 0 1 2  Control/stop worrying 0 0 3  Worry too much - different things 0 1 3  Trouble relaxing 0 0 0  Restless 0 0 0  Easily annoyed or irritable 0 0 2  Afraid - awful might happen 0 0 2  Total GAD 7 Score 0 2 12      Review of Systems:   Pertinent items are noted in HPI Denies abnormal vaginal discharge w/ itching/odor/irritation, headaches, visual changes, shortness of breath, chest pain, abdominal pain, severe nausea/vomiting, or problems with urination or bowel movements unless otherwise stated above. Pertinent History Reviewed:  Reviewed past medical,surgical, social, obstetrical and family history.  Reviewed problem list, medications and allergies. Physical Assessment:   Vitals:    01/16/24 1149  BP: 129/83  Pulse: 66  Weight: 283 lb (128.4 kg)  Body mass index is 43.03 kg/m.        Physical Examination:   General appearance: Well appearing, and in no distress  Mental status: Alert, oriented to person, place, and time  Skin: Warm & dry  Cardiovascular: Normal heart rate noted  Respiratory: Normal respiratory effort, no distress  Abdomen: Soft, gravid, nontender  Pelvic: Cervical exam deferred/declined        Extremities:    Fetal Status:     Movement: Present   NST: FHR baseline 125 bpm, Variability: moderate, Accelerations:present, Decelerations:  Absent= Cat 1/reactive Toco: none   Chaperone: N/A No results found for this or any previous visit (from the past 24 hours).  Assessment & Plan:  1) Low-risk pregnancy [id number] at [redacted]w[redacted]d with an Estimated Date of Delivery: 01/16/24   2) Postdates, reactive NST, IOL scheduled for 9/3 AM (nothing available 9/2)-note added to move to 9/2 if available. Declines eIOL. To f/u next Tues pm for NST/OP FB.  IOL form faxed and orders placed   3) Anemia> s/p IV Feraheme  4) H/O PPHTN> all home bp's  wnl, continue checking daily- if >140/90 or pre-e s/s go to [facility]   Meds: No orders of the defined types were placed in this encounter.  Labs/procedures today: NST  Plan:  Continue routine obstetrical care  Next visit: prefers will be in person for postdates NST/FB    Reviewed: Term labor symptoms and general obstetric precautions including but not limited to vaginal bleeding, contractions, leaking of fluid and fetal movement were reviewed in detail with the patient.  All questions were answered. Does have home bp cuff. Office bp cuff given: not applicable. Check bp daily, let us  know if consistently >140 and/or >90.  Follow-up: Return for 9/2 PM LROB w/ NST and foley insert.  Future Appointments  Date Time Provider Department Center  01/23/2024  3:30 PM [facility] NURSE [facility]  01/23/2024  3:50 PM Kizzie Suzen SAUNDERS,  CNM [facility]  01/24/2024  6:30 AM [facility] SCHED ROOM [facility] None    No orders of the defined types were placed in this encounter.  Suzen SAUNDERS Kizzie CNM, [facility] 01/16/2024 12:58 PM

## 2024-01-18 ENCOUNTER — Telehealth (HOSPITAL_COMMUNITY): Payer: Self-pay | Admitting: *Deleted

## 2024-01-18 ENCOUNTER — Encounter (HOSPITAL_COMMUNITY): Payer: Self-pay | Admitting: *Deleted

## 2024-01-18 NOTE — Telephone Encounter (Signed)
 Preadmission screen

## 2024-01-19 ENCOUNTER — Other Ambulatory Visit: Payer: Self-pay

## 2024-01-19 ENCOUNTER — Inpatient Hospital Stay (HOSPITAL_COMMUNITY)
Admission: AD | Admit: 2024-01-19 | Discharge: 2024-01-20 | DRG: 806 | Disposition: A | Discharge status: Home / Self Care | Payer: PRIVATE HEALTH INSURANCE | Attending: Obstetrics and Gynecology | Admitting: Obstetrics and Gynecology

## 2024-01-19 ENCOUNTER — Encounter (HOSPITAL_COMMUNITY): Payer: Self-pay | Admitting: Obstetrics and Gynecology

## 2024-01-19 DIAGNOSIS — O26893 Other specified pregnancy related conditions, third trimester: Secondary | ICD-10-CM | POA: Diagnosis present

## 2024-01-19 DIAGNOSIS — O9902 Anemia complicating childbirth: Secondary | ICD-10-CM | POA: Diagnosis present

## 2024-01-19 DIAGNOSIS — Z7982 Long term (current) use of aspirin: Secondary | ICD-10-CM | POA: Diagnosis not present

## 2024-01-19 DIAGNOSIS — O9832 Other infections with a predominantly sexual mode of transmission complicating childbirth: Secondary | ICD-10-CM | POA: Diagnosis present

## 2024-01-19 DIAGNOSIS — Z8249 Family history of ischemic heart disease and other diseases of the circulatory system: Secondary | ICD-10-CM

## 2024-01-19 DIAGNOSIS — Z3480 Encounter for supervision of other normal pregnancy, unspecified trimester: Principal | ICD-10-CM

## 2024-01-19 DIAGNOSIS — O480 Post-term pregnancy: Secondary | ICD-10-CM | POA: Diagnosis not present

## 2024-01-19 DIAGNOSIS — O99013 Anemia complicating pregnancy, third trimester: Secondary | ICD-10-CM

## 2024-01-19 DIAGNOSIS — Z3A40 40 weeks gestation of pregnancy: Secondary | ICD-10-CM | POA: Diagnosis not present

## 2024-01-19 DIAGNOSIS — A6000 Herpesviral infection of urogenital system, unspecified: Secondary | ICD-10-CM | POA: Diagnosis present

## 2024-01-19 DIAGNOSIS — O134 Gestational [pregnancy-induced] hypertension without significant proteinuria, complicating childbirth: Secondary | ICD-10-CM | POA: Diagnosis present

## 2024-01-19 LAB — COMPREHENSIVE METABOLIC PANEL WITH GFR
ALT: 12 U/L (ref 0–44)
AST: 21 U/L (ref 15–41)
Albumin: 3.2 g/dL — ABNORMAL LOW (ref 3.5–5.0)
Alkaline Phosphatase: 141 U/L — ABNORMAL HIGH (ref 38–126)
Anion gap: 14 (ref 5–15)
BUN: 9 mg/dL (ref 6–20)
CO2: 18 mmol/L — ABNORMAL LOW (ref 22–32)
Calcium: 9.4 mg/dL (ref 8.9–10.3)
Chloride: 102 mmol/L (ref 98–111)
Creatinine, Ser: 0.53 mg/dL (ref 0.44–1.00)
GFR, Estimated: >60 mL/min (ref >60)
Glucose, Bld: 82 mg/dL (ref 70–99)
Potassium: 3.8 mmol/L (ref 3.5–5.1)
Sodium: 134 mmol/L — ABNORMAL LOW (ref 135–145)
Total Bilirubin: 0.4 mg/dL (ref 0.0–1.2)
Total Protein: 6.6 g/dL (ref 6.5–8.1)

## 2024-01-19 LAB — CBC
HCT: 37.7 % (ref 36.0–46.0)
Hemoglobin: 12.1 g/dL (ref 12.0–15.0)
MCH: 27.4 pg (ref 26.0–34.0)
MCHC: 32.1 g/dL (ref 30.0–36.0)
MCV: 85.3 fL (ref 80.0–100.0)
Platelets: 206 K/uL (ref 150–400)
RBC: 4.42 MIL/uL (ref 3.87–5.11)
RDW: 15.6 % — ABNORMAL HIGH (ref 11.5–15.5)
WBC: 9 K/uL (ref 4.0–10.5)
nRBC: 0 % (ref 0.0–0.2)

## 2024-01-19 LAB — TYPE AND SCREEN
ABO/RH(D): A POS
Antibody Screen: NEGATIVE

## 2024-01-19 LAB — RPR: RPR Ser Ql: NONREACTIVE

## 2024-01-19 MED ORDER — POTASSIUM CHLORIDE CRYS ER 20 MEQ PO TBCR
20.0000 meq | EXTENDED_RELEASE_TABLET | Freq: Every day | ORAL | Status: DC
  Administered 2024-01-19 – 2024-01-20 (×2): 20 meq via ORAL
  Filled 2024-01-19 (×3): qty 1

## 2024-01-19 MED ORDER — ACETAMINOPHEN 325 MG PO TABS: 650.0000 mg | ORAL_TABLET | ORAL | Status: DC | PRN

## 2024-01-19 MED ORDER — EPHEDRINE 5 MG/ML INJ: 10.0000 mg | INTRAVENOUS | Status: DC | PRN

## 2024-01-19 MED ORDER — DIPHENHYDRAMINE HCL 50 MG/ML IJ SOLN: 12.5000 mg | INTRAMUSCULAR | Status: DC | PRN

## 2024-01-19 MED ORDER — DIPHENHYDRAMINE HCL 25 MG PO CAPS: 25.0000 mg | ORAL_CAPSULE | Freq: Four times a day (QID) | ORAL | Status: DC | PRN

## 2024-01-19 MED ORDER — ONDANSETRON HCL 4 MG PO TABS: 4.0000 mg | ORAL_TABLET | ORAL | Status: DC | PRN

## 2024-01-19 MED ORDER — LACTATED RINGERS IV SOLN: INTRAVENOUS | Status: DC

## 2024-01-19 MED ORDER — FENTANYL-BUPIVACAINE-NACL 0.5-0.125-0.9 MG/250ML-% EP SOLN: 12.0000 mL/h | EPIDURAL | Status: DC | PRN

## 2024-01-19 MED ORDER — SOD CITRATE-CITRIC ACID 500-334 MG/5ML PO SOLN: 30.0000 mL | ORAL | Status: DC | PRN

## 2024-01-19 MED ORDER — ONDANSETRON HCL 4 MG/2ML IJ SOLN: 4.0000 mg | Freq: Four times a day (QID) | INTRAMUSCULAR | Status: DC | PRN

## 2024-01-19 MED ORDER — PHENYLEPHRINE 80 MCG/ML (10ML) SYRINGE FOR IV PUSH (FOR BLOOD PRESSURE SUPPORT): 80.0000 ug | PREFILLED_SYRINGE | INTRAVENOUS | Status: DC | PRN

## 2024-01-19 MED ORDER — OXYCODONE-ACETAMINOPHEN 5-325 MG PO TABS: 2.0000 | ORAL_TABLET | ORAL | Status: DC | PRN

## 2024-01-19 MED ORDER — SIMETHICONE 80 MG PO CHEW: 80.0000 mg | CHEWABLE_TABLET | ORAL | Status: DC | PRN

## 2024-01-19 MED ORDER — FERROUS FUMARATE 324 (106 FE) MG PO TABS
1.0000 | ORAL_TABLET | ORAL | Status: DC
  Administered 2024-01-20: 106 mg via ORAL
  Filled 2024-01-19: qty 1

## 2024-01-19 MED ORDER — TRANEXAMIC ACID-NACL 1000-0.7 MG/100ML-% IV SOLN
INTRAVENOUS | Status: AC
  Filled 2024-01-19: qty 100

## 2024-01-19 MED ORDER — OXYTOCIN-SODIUM CHLORIDE 30-0.9 UT/500ML-% IV SOLN
2.5000 [IU]/h | INTRAVENOUS | Status: DC
  Filled 2024-01-19: qty 500

## 2024-01-19 MED ORDER — IBUPROFEN 600 MG PO TABS
600.0000 mg | ORAL_TABLET | Freq: Four times a day (QID) | ORAL | Status: DC
  Administered 2024-01-19 – 2024-01-20 (×4): 600 mg via ORAL
  Filled 2024-01-19 (×4): qty 1

## 2024-01-19 MED ORDER — OXYTOCIN BOLUS FROM INFUSION
333.0000 mL | Freq: Once | INTRAVENOUS | Status: AC
  Administered 2024-01-19: 333 mL via INTRAVENOUS

## 2024-01-19 MED ORDER — SENNOSIDES-DOCUSATE SODIUM 8.6-50 MG PO TABS
2.0000 | ORAL_TABLET | Freq: Every day | ORAL | Status: DC
  Administered 2024-01-20: 2 via ORAL
  Filled 2024-01-19: qty 2

## 2024-01-19 MED ORDER — BENZOCAINE-MENTHOL 20-0.5 % EX AERO
1.0000 | INHALATION_SPRAY | CUTANEOUS | Status: DC | PRN
  Filled 2024-01-19: qty 56

## 2024-01-19 MED ORDER — LEVONORGESTREL 20 MCG/DAY IU IUD
1.0000 | INTRAUTERINE_SYSTEM | Freq: Once | INTRAUTERINE | Status: DC
  Filled 2024-01-19: qty 1

## 2024-01-19 MED ORDER — OXYCODONE-ACETAMINOPHEN 5-325 MG PO TABS: 1.0000 | ORAL_TABLET | ORAL | Status: DC | PRN

## 2024-01-19 MED ORDER — TETANUS-DIPHTH-ACELL PERTUSSIS 5-2.5-18.5 LF-MCG/0.5 IM SUSY: 0.5000 mL | PREFILLED_SYRINGE | Freq: Once | INTRAMUSCULAR | Status: DC

## 2024-01-19 MED ORDER — FUROSEMIDE 20 MG PO TABS
20.0000 mg | ORAL_TABLET | Freq: Every day | ORAL | Status: DC
  Administered 2024-01-19 – 2024-01-20 (×2): 20 mg via ORAL
  Filled 2024-01-19 (×3): qty 1

## 2024-01-19 MED ORDER — PRENATAL MULTIVITAMIN CH
1.0000 | ORAL_TABLET | ORAL | Status: DC
  Administered 2024-01-19: 1 via ORAL
  Filled 2024-01-19: qty 1

## 2024-01-19 MED ORDER — MAGNESIUM HYDROXIDE 400 MG/5ML PO SUSP: 30.0000 mL | Freq: Every day | ORAL | Status: DC | PRN

## 2024-01-19 MED ORDER — LACTATED RINGERS IV SOLN: 500.0000 mL | Freq: Once | INTRAVENOUS | Status: DC

## 2024-01-19 MED ORDER — TRANEXAMIC ACID-NACL 1000-0.7 MG/100ML-% IV SOLN: 1000.0000 mg | INTRAVENOUS | Status: AC

## 2024-01-19 MED ORDER — DIBUCAINE (PERIANAL) 1 % EX OINT: 1.0000 | TOPICAL_OINTMENT | CUTANEOUS | Status: DC | PRN

## 2024-01-19 MED ORDER — COCONUT OIL OIL
1.0000 | TOPICAL_OIL | Status: DC | PRN
Start: 2024-01-19 — End: 2024-01-20

## 2024-01-19 MED ORDER — ONDANSETRON HCL 4 MG/2ML IJ SOLN: 4.0000 mg | INTRAMUSCULAR | Status: DC | PRN

## 2024-01-19 MED ORDER — LACTATED RINGERS IV SOLN: 500.0000 mL | INTRAVENOUS | Status: DC | PRN

## 2024-01-19 MED ORDER — MEASLES, MUMPS & RUBELLA VAC IJ SOLR: 0.5000 mL | Freq: Once | INTRAMUSCULAR | Status: DC

## 2024-01-19 MED ORDER — WITCH HAZEL-GLYCERIN EX PADS
1.0000 | MEDICATED_PAD | CUTANEOUS | Status: DC | PRN
Start: 2024-01-19 — End: 2024-01-20

## 2024-01-19 MED ORDER — LIDOCAINE HCL (PF) 1 % IJ SOLN: 30.0000 mL | INTRAMUSCULAR | Status: DC | PRN

## 2024-01-19 NOTE — MAU Note (Signed)
.  April Tyler is a 33 y.o. at [redacted]w[redacted]d here in MAU reporting: CTX that started at 0600 today and are 2 mins apart per pt. She denies LOF and vaginal bleeding, she reports +FM. Pt denies recent sexual intercourse and says she was checked in the office 2 weeks ago and was 1cm dilated.  LMP: na Onset of complaint: 0600 Pain score: 10/10 Vitals:   01/19/24 0945  Resp: 18  Temp: 98.3 F (36.8 C)     FHT: 127  Lab orders placed from triage: labor eval

## 2024-01-19 NOTE — H&P (Addendum)
 OBSTETRIC ADMISSION HISTORY AND PHYSICAL  April Tyler is a 33 y.o. female [id number] with IUP at [redacted]w[redacted]d by LMP c/w US  at 8wk presenting for SOL. She reports +FMs, No LOF, no VB, no blurry vision, headaches or peripheral edema, and RUQ pain.  She plans on breast feeding. She request PP IUD for birth control. She received her prenatal care at [facility]   Dating: By  LMP c/w US  at 8wk --->  Estimated Date of Delivery: 01/16/24  Sono:    @[redacted]w[redacted]d , CWD, normal anatomy, cephalic presentation, posterior placental lie, 1283g, 67% EFW   Prenatal History/Complications: Anemia of pregnancy, Hx of postpartum HTN, HSV-2 infx,   Past Medical History: Past Medical History:  Diagnosis Date   Anemia    BV (bacterial vaginosis) 01/31/2014   Exposure to chlamydia 03/13/2015   GBS bacteriuria 05/11/2017   tx 05/11/17   POC: neg   Tx in labor   History of elective abortion 11/29/2013   History of postpartum hypertension 04/10/2017   Requiring meds       Baby ASA started @ 19wks (entry to care)   HSV (herpes simplex virus) anogenital infection    Induced abortion 08/21/2015   Miscarriage 12/22/2015   SAB July 2017   Supervision of normal pregnancy 04/10/2017    [facility]  Initiated Care at  19wk  FOB Darius Jones  Dating By LMP c/w 2nd trimester U/S 15wk  Pap 04/10/17 neg  GC/CT Initial:  -/-              36+wks:-/-  Genetic Screen declined  CF screen Declined   Anatomic US  Normal female  Flu vaccine Declined 04/10/17   Tdap Recommended ~ 28wks  Glucose Screen  2 hr80/111/87  GBS Pos (urine)  Feed Preference bottle  Contraception BTL, consent:    UTI (lower urinary tract infection)    Vitamin D  deficiency     Past Surgical History: Past Surgical History:  Procedure Laterality Date   elective abortion     elective abortion  08/21/15    Obstetrical History: OB History     Gravida  8   Para  4   Term  4   Preterm      AB  3   Living  4      SAB  1   IAB  2   Ectopic       Multiple  0   Live Births  4           Social History Social History   Socioeconomic History   Marital status: Single    Spouse name: Not on file   Number of children: Not on file   Years of education: Not on file   Highest education level: Not on file  Occupational History   Not on file  Tobacco Use   Smoking status: Never   Smokeless tobacco: Never  Vaping Use   Vaping status: Never Used  Substance and Sexual Activity   Alcohol use: No    Comment: occ; not now   Drug use: No   Sexual activity: Yes    Birth control/protection: None  Other Topics Concern   Not on file  Social History Narrative   Right handed    Social Drivers of Health   Financial Resource Strain: Low Risk  (05/23/2023)   Overall Financial Resource Strain (CARDIA)    Difficulty of Paying Living Expenses: Not hard at all  Food Insecurity: No Food Insecurity (05/23/2023)  Hunger Vital Sign    Worried About Running Out of Food in the Last Year: Never true    Ran Out of Food in the Last Year: Never true  Transportation Needs: No Transportation Needs (05/23/2023)   PRAPARE - Administrator, Civil Service (Medical): No    Lack of Transportation (Non-Medical): No  Physical Activity: Insufficiently Active (05/23/2023)   Exercise Vital Sign    Days of Exercise per Week: 2 days    Minutes of Exercise per Session: 20 min  Stress: No Stress Concern Present (05/23/2023)   [employer] of Occupational Health - Occupational Stress Questionnaire    Feeling of Stress : Not at all  Social Connections: Moderately Isolated (05/23/2023)   Social Connection and Isolation Panel    Frequency of Communication with Friends and Family: More than three times a week    Frequency of Social Gatherings with Friends and Family: Twice a week    Attends Religious Services: 1 to 4 times per year    Active Member of [employer] or Organizations: No    Attends Engineer, structural: Never    Marital  Status: Never married    Family History: Family History  Problem Relation Age of Onset   Other Father        B 12 Deficiency   Asthma Daughter    Hypertension Maternal Grandmother    Obesity Paternal Grandmother    Other Sister        B 12 Defficiency   Hypertension Maternal Aunt     Allergies: No Known Allergies  Medications Prior to Admission  Medication Sig Dispense Refill Last Dose/Taking   acetaminophen  (TYLENOL ) 500 MG tablet Take 500 mg by mouth every 6 (six) hours as needed for mild pain or headache.   01/19/2024 at  2:00 AM   aspirin  EC 81 MG tablet Take 2 tablets (162 mg total) by mouth daily. Swallow whole. 180 tablet 2 Past Week   Ferrous Fumarate  (HEMOCYTE - 106 MG FE) 324 (106 Fe) MG TABS tablet Take 1 tablet (106 mg of iron  total) by mouth every other day. 30 tablet 3 01/18/2024   Prenatal Vit-Fe Fumarate-FA (PRENATAL VITAMIN PO) Take by mouth.   Past Week   valACYclovir  (VALTREX ) 500 MG tablet TAKE 1 TABLET BY MOUTH TWICE DAILY 60 tablet 1 01/18/2024 Noon   amoxicillin  (AMOXIL ) 875 MG tablet SMARTSIG:1 Tablet(s) By Mouth Every 12 Hours (Patient not taking: Reported on 12/26/2023)   Not Taking   nitrofurantoin , macrocrystal-monohydrate, (MACROBID ) 100 MG capsule Take 1 capsule (100 mg total) by mouth 2 (two) times daily. (Patient not taking: Reported on 12/26/2023) 14 capsule 0      Review of Systems   All systems reviewed and negative except as stated in HPI  Blood pressure (!) 144/89, pulse 99, temperature 98.3 F (36.8 C), temperature source Oral, resp. rate 18, height 5' 9 (1.753 m), weight 126.1 kg, last menstrual period 04/11/2023, SpO2 98%. General appearance: alert, cooperative, and no distress Lungs: clear to auscultation bilaterally Heart: regular rate and rhythm Abdomen: soft, non-tender; bowel sounds normal Pelvic: Cervical exam as below, no HSV lesions visualized in the perineum. Unable to perform speculum exam prior to pt progressing to complete and  water breaking Extremities: Homans sign is negative, no sign of DVT Presentation: cephalic Fetal monitoringBaseline: 130 bpm, Variability: Good {> 6 bpm), Accelerations: Reactive, and Decelerations: Absent Uterine activity and Frequency: Every 2-3 minutes Dilation: Lip/rim Effacement (%): 90 Station: -1 Exam by:: K. Cowher  RN   Prenatal labs: ABO, Rh: A/Positive/-- (02/10 1644) Antibody: Negative (06/03 0923) Rubella: 16.30 (02/10 1644) RPR: Non Reactive (06/03 0923)  HBsAg: Negative (02/10 1644)  HIV: Non Reactive (06/03 0923)  GBS: Negative/-- (07/30 1549)    Lab Results  Component Value Date   GBS Negative 12/20/2023   GTT normal Genetic screening  low risk Anatomy US  normal  Immunization History  Administered Date(s) Administered   Influenza,inj,Quad PF,6+ Mos 06/15/2017   Moderna Sars-Covid-2 Vaccination 06/24/2019, 07/10/2020   Td 05/24/2015    Prenatal Transfer Tool  Maternal Diabetes: No Genetic Screening: Normal Maternal Ultrasounds/Referrals: Normal Fetal Ultrasounds or other Referrals:  None Maternal Substance Abuse:  No Significant Maternal Medications:  None Significant Maternal Lab Results: None Number of Prenatal Visits:greater than 3 verified prenatal visits Maternal Vaccinations:TDap declined Other Comments:  None   No results found for this or any previous visit (from the past 24 hours).  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 H1E5965 at [redacted]w[redacted]d here for SOL.  #Labor:Presented in active phase of labor, unruptured.  #Pain: Unmedicated #FWB: Cat 1 fetal strip #GBS status:   negative #Feeding: Breastmilk  #Reproductive Life planning: IUD Mirena  OP, deferred original plan for immed PP #Circ:  not applicable #gHTN: on admission BP up to 144 systolic, another mild range pressure and an outpt reading up to 149 systolic. Obtain CBC, CMP. Start on Lasix  and K in MB unit  Buel Avers, MD  01/19/2024, 10:17 AM

## 2024-01-19 NOTE — Lactation Note (Signed)
 This note was copied from a baby's chart. Lactation Consultation Note  Patient Name: April Tyler Unijb'd Date: 01/19/2024 Age:35 hours Reason for consult: Initial assessment;Term  P5, MOB informed LC she only breastfeed her 33 year old for 2 days, this is her first time really breastfeeding. Her goal is to breastfeed infant longer, her feeding choice is breast and formula feeding.  LC entered the room infant was cuing to feed, LC discussed hunger cues, with MOB and signs of satiety. MOB latched infant on her left breast with pillow support, infant latched with depth, sustaining her latch and breast feed for 11 minutes. MOB will continue to latch infant first for every feeding, breastfeed infant by cues, on demand, 8-12 times within 24 hours, skin to skin. MOB knows if infant is still cuing after latching to the first breast to offer the 2nd breast during the same feeding before offering formula. MOB was given handout Supplementing with Breastfeeding.  MOB knows to call for further latch assistance if needed. LC changed void diaper while in the room. LC discussed importance of maternal rest, meals and hydration.   Maternal Data Has patient been taught Hand Expression?: Yes  Feeding Mother's Current Feeding Choice: Breast Milk and Formula Nipple Type: Slow - flow  LATCH Score Latch: Grasps breast easily, tongue down, lips flanged, rhythmical sucking.  Audible Swallowing: Spontaneous and intermittent  Type of Nipple: Everted at rest and after stimulation  Comfort (Breast/Nipple): Soft / non-tender  Hold (Positioning): Assistance needed to correctly position infant at breast and maintain latch.  LATCH Score: 9   Lactation Tools Discussed/Used    Interventions Interventions: Breast feeding basics reviewed;Assisted with latch;Skin to skin;Support pillows;Adjust position;Breast compression;Position options;Expressed milk;Hand express;CDC milk storage guidelines;CDC Guidelines for  Breast Pump Cleaning;Guidelines for Milk Supply and Pumping Schedule Handout;LPT handout/interventions  Discharge Pump: DEBP;Personal (Per MOB, she has Medela DEBP at home.)  Consult Status Consult Status: Follow-up Date: 01/20/24 Follow-up type: In-patient    [name] 01/19/2024, 4:50 PM

## 2024-01-19 NOTE — Discharge Summary (Signed)
 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 yo [id number] at [redacted]w[redacted]d was admitted in Active Labor on 01/19/2024. Labor course was complicated by gHTN w/ negative PEC labs.  Membrane Rupture Time/Date: 10:57 AM,01/19/2024  Delivery Method:Vaginal, Spontaneous Operative Delivery:N/A Episiotomy: None Lacerations:  Periurethral Patient had a postpartum course complicated by gHTN and pt was started on lasix  and K; her pressures remained within normal limits.  She is ambulating, tolerating a regular diet, passing flatus, and urinating well. Patient is discharged home in stable condition on 01/20/24.  Newborn Data: Birth date:01/19/2024 Birth time:11:18 AM Gender:Female Living status:Living Apgars:9 ,9  Weight:4060 g  Magnesium  Sulfate received: No BMZ received: No Rhophylac:No MMR:No T-DaP:declined Flu: No RSV Vaccine received: No Transfusion:No  Immunizations received: Immunization History  Administered Date(s) Administered   Influenza,inj,Quad PF,6+ Mos 06/15/2017   Moderna Sars-Covid-2 Vaccination 06/24/2019, 07/10/2020   Td 05/24/2015    Physical exam  Vitals:   01/19/24 2133 01/19/24 2306 01/20/24 0309 01/20/24 0500  BP: 116/67 126/80 114/62 112/70  Pulse: 74 73 76 79  Resp: 18 18 18 18   Temp: 98.3 F (36.8 C) 98.8 F (37.1 C) (!) 97.4 F (36.3 C) 98 F (36.7 C)   TempSrc: Oral Oral Oral Oral  SpO2: 99% 100% 100% 100%  Weight:      Height:       General: alert, cooperative, and no distress Lochia: appropriate Uterine Fundus: firm Incision: N/A DVT Evaluation: No evidence of DVT seen on physical exam. Labs:  Lab Results  Component Value Date   WBC 10.3 01/20/2024   HGB 10.1 (L) 01/20/2024   HCT 31.9 (L) 01/20/2024   MCV 86.0 01/20/2024   PLT 197 01/20/2024      Latest Ref Rng & Units 01/19/2024   12:43 PM  CMP  Glucose 70 - 99 mg/dL 82   BUN 6 - 20 mg/dL 9   Creatinine 9.55 - 8.99 mg/dL 9.46   Sodium 864 - 854 mmol/L 134   Potassium 3.5 - 5.1 mmol/L 3.8   Chloride 98 - 111 mmol/L 102   CO2 22 - 32 mmol/L 18   Calcium 8.9 - 10.3 mg/dL 9.4   Total Protein 6.5 - 8.1 g/dL 6.6   Total Bilirubin 0.0 - 1.2 mg/dL 0.4   Alkaline Phos 38 - 126 U/L 141   AST 15 - 41 U/L 21   ALT 0 - 44 U/L 12    Edinburgh Score:    01/19/2024   11:12 PM  Edinburgh Postnatal Depression Scale Screening Tool  I have been able to laugh and see the funny side of things. 0  I have looked forward with enjoyment to things. 0  I have blamed myself unnecessarily when things went wrong.  0  I have been anxious or worried for no good reason. 0  I have felt scared or panicky for no good reason. 0  Things have been getting on top of me. 0  I have been so unhappy that I have had difficulty sleeping. 0  I have felt sad or miserable. 0  I have been so unhappy that I have been crying. 0  The thought of harming myself has occurred to me. 0  Edinburgh Postnatal Depression Scale Total 0   Edinburgh Postnatal Depression Scale Total: 0   After visit meds:  Allergies as of 01/20/2024   No Known Allergies      Medication List     TAKE these medications    acetaminophen  325 MG tablet Commonly known as: Tylenol  Take 2 tablets (650 mg total) by mouth every 4 (four) hours as needed for up to 180 doses (for pain scale < 4).   Ferrous Fumarate  324 (106 Fe) MG Tabs  tablet Commonly known as: HEMOCYTE - 106 mg FE Take 1 tablet (106 mg of iron  total) by mouth every other day.   furosemide  20 MG tablet Commonly known as: LASIX  Take 1 tablet (20 mg total) by mouth daily for 3 days.   ibuprofen  600 MG tablet Commonly known as: ADVIL  Take 1 tablet (600 mg total) by mouth every 6 (six) hours.   potassium chloride  SA 20 MEQ tablet Commonly known as: KLOR-CON  M Take 1 tablet (20 mEq total) by mouth daily for 3 doses.   PRENATAL VITAMIN PO Take by mouth.   senna-docusate 8.6-50 MG tablet Commonly known as: Senokot-S Take 2 tablets by mouth daily.         Discharge home in stable condition Infant Feeding: Bottle and Breast Infant Disposition:home with mother Discharge instruction: per After Visit Summary and Postpartum booklet. Activity: Advance as tolerated. Pelvic rest for 6 weeks.  Diet: routine diet Future Appointments: Future Appointments  Date Time Provider Department Center  01/26/2024 11:30 AM [facility] NURSE [facility]  03/01/2024  9:50 AM Delores Nidia CROME, FNP [facility]   Follow up Visit:   Please schedule this patient for a In person postpartum visit in 4 with the following provider: MD. Additional Postpartum F/U:BP check 1 week  Low risk pregnancy complicated by: HTN Delivery mode:  Vaginal, Spontaneous Anticipated Birth Control:  IUD  Message sent 01/20/2024  01/20/2024 Buel Avers, MD

## 2024-01-20 LAB — CBC
HCT: 31.9 % — ABNORMAL LOW (ref 36.0–46.0)
Hemoglobin: 10.1 g/dL — ABNORMAL LOW (ref 12.0–15.0)
MCH: 27.2 pg (ref 26.0–34.0)
MCHC: 31.7 g/dL (ref 30.0–36.0)
MCV: 86 fL (ref 80.0–100.0)
Platelets: 197 K/uL (ref 150–400)
RBC: 3.71 MIL/uL — ABNORMAL LOW (ref 3.87–5.11)
RDW: 15.8 % — ABNORMAL HIGH (ref 11.5–15.5)
WBC: 10.3 K/uL (ref 4.0–10.5)
nRBC: 0 % (ref 0.0–0.2)

## 2024-01-20 MED ORDER — IBUPROFEN 600 MG PO TABS: 600.0000 mg | ORAL_TABLET | Freq: Four times a day (QID) | ORAL | 0 refills | Status: AC

## 2024-01-20 MED ORDER — ACETAMINOPHEN 325 MG PO TABS: 650.0000 mg | ORAL_TABLET | ORAL | 0 refills | Status: AC | PRN

## 2024-01-20 MED ORDER — POTASSIUM CHLORIDE CRYS ER 20 MEQ PO TBCR: 20.0000 meq | EXTENDED_RELEASE_TABLET | Freq: Every day | ORAL | 0 refills | Status: AC

## 2024-01-20 MED ORDER — FUROSEMIDE 20 MG PO TABS: 20.0000 mg | ORAL_TABLET | Freq: Every day | ORAL | 0 refills | Status: DC

## 2024-01-20 MED ORDER — SENNOSIDES-DOCUSATE SODIUM 8.6-50 MG PO TABS: 2.0000 | ORAL_TABLET | Freq: Every day | ORAL | 0 refills | Status: AC

## 2024-01-20 NOTE — Lactation Note (Signed)
 This note was copied from a baby's chart. Lactation Consultation Note  Patient Name: April Tyler Date: 01/20/2024 Age:35 hours Reason for consult: Follow-up assessment;Term  P5- MOB reports that infant is still latching well on the breast and drinking from the bottle well. MOB denies having any questions or concerns. MOB requested a manual pump to take home. LC provided the pump and reviewed how to use it. LC reviewed cluster feeding and engorgement/breast care. LC encouraged MOB to call for further assistance as needed.  Maternal Data Has patient been taught Hand Expression?: Yes Does the patient have breastfeeding experience prior to this delivery?: No  Feeding Mother's Current Feeding Choice: Breast Milk and Formula Nipple Type: Slow - flow  Lactation Tools Discussed/Used Tools: Pump Breast pump type: Manual Pump Education: Setup, frequency, and cleaning;Milk Storage Reason for Pumping: MOB request Pumping frequency: 15-20 min every 3 hrs  Interventions Interventions: Breast feeding basics reviewed;Hand pump;Education;LC Services brochure  Discharge Discharge Education: Engorgement and breast care;Warning signs for feeding baby Pump: DEBP;Manual;Personal  Consult Status Consult Status: Complete Date: 01/20/24    Recardo Hoit BS, IBCLC 01/20/2024, 12:25 PM

## 2024-01-23 ENCOUNTER — Inpatient Hospital Stay (HOSPITAL_COMMUNITY): Payer: PRIVATE HEALTH INSURANCE

## 2024-01-23 ENCOUNTER — Inpatient Hospital Stay (HOSPITAL_COMMUNITY): Admission: AD | Admit: 2024-01-23 | Payer: PRIVATE HEALTH INSURANCE | Source: Home / Self Care

## 2024-01-23 ENCOUNTER — Other Ambulatory Visit: Payer: PRIVATE HEALTH INSURANCE

## 2024-01-23 ENCOUNTER — Encounter: Payer: PRIVATE HEALTH INSURANCE | Admitting: Women's Health

## 2024-01-24 ENCOUNTER — Inpatient Hospital Stay (HOSPITAL_COMMUNITY): Payer: PRIVATE HEALTH INSURANCE

## 2024-01-26 ENCOUNTER — Ambulatory Visit: Payer: PRIVATE HEALTH INSURANCE

## 2024-01-26 VITALS — BP 128/85 | HR 57

## 2024-01-26 DIAGNOSIS — Z0130 Encounter for examination of blood pressure without abnormal findings: Secondary | ICD-10-CM | POA: Diagnosis not present

## 2024-01-26 DIAGNOSIS — Z8679 Personal history of other diseases of the circulatory system: Secondary | ICD-10-CM

## 2024-01-26 NOTE — Progress Notes (Signed)
   NURSE VISIT- BLOOD PRESSURE CHECK  SUBJECTIVE:  April Tyler is a 33 y.o. H1E4964 female here for BP check. She is postpartum, delivery date 01/19/24    HYPERTENSION ROS:  Pregnant/postpartum:  Severe headaches that don't go away with tylenol /other medicines: No  Visual changes (seeing spots/double/blurred vision) No  Severe pain under right breast breast or in center of upper chest No  Severe nausea/vomiting No  Taking medicines as instructed not applicable  OBJECTIVE:  BP 128/85 (BP Location: Right Arm, Patient Position: Sitting, Cuff Size: Large)   Pulse (!) 57   LMP 04/11/2023   Appearance alert, well appearing, and in no distress.  ASSESSMENT: Postpartum  blood pressure check  PLAN: Discussed with April Tyler   Recommendations: Check blood pressure at home. If it becomes 140/90 or greater OR you develop headaches, blurred vision or pain under your right breast let us  know.    Follow-up: as scheduled   April Tyler  01/26/2024 11:42 AM

## 2024-03-01 ENCOUNTER — Ambulatory Visit: Payer: PRIVATE HEALTH INSURANCE | Admitting: Obstetrics and Gynecology

## 2024-05-09 ENCOUNTER — Encounter: Payer: PRIVATE HEALTH INSURANCE | Admitting: Nurse Practitioner

## 2024-06-05 ENCOUNTER — Emergency Department (HOSPITAL_COMMUNITY)
Admission: EM | Admit: 2024-06-05 | Discharge: 2024-06-05 | Discharge status: Against Medical Advice | Attending: Emergency Medicine | Admitting: Emergency Medicine

## 2024-06-05 ENCOUNTER — Encounter (HOSPITAL_COMMUNITY): Payer: Self-pay

## 2024-06-05 ENCOUNTER — Other Ambulatory Visit: Payer: Self-pay

## 2024-06-05 DIAGNOSIS — Z5321 Procedure and treatment not carried out due to patient leaving prior to being seen by health care provider: Secondary | ICD-10-CM | POA: Insufficient documentation

## 2024-06-05 DIAGNOSIS — N920 Excessive and frequent menstruation with regular cycle: Secondary | ICD-10-CM | POA: Insufficient documentation

## 2024-06-05 DIAGNOSIS — R531 Weakness: Secondary | ICD-10-CM | POA: Diagnosis present

## 2024-06-05 LAB — COMPREHENSIVE METABOLIC PANEL WITH GFR
ALT: 13 U/L (ref 0–44)
AST: 16 U/L (ref 15–41)
Albumin: 4.5 g/dL (ref 3.5–5.0)
Alkaline Phosphatase: 70 U/L (ref 38–126)
Anion gap: 11 (ref 5–15)
BUN: 10 mg/dL (ref 6–20)
CO2: 24 mmol/L (ref 22–32)
Calcium: 9 mg/dL (ref 8.9–10.3)
Chloride: 102 mmol/L (ref 98–111)
Creatinine, Ser: 0.63 mg/dL (ref 0.44–1.00)
GFR, Estimated: >60 mL/min
Glucose, Bld: 84 mg/dL (ref 70–99)
Potassium: 4.1 mmol/L (ref 3.5–5.1)
Sodium: 138 mmol/L (ref 135–145)
Total Bilirubin: 0.5 mg/dL (ref 0.0–1.2)
Total Protein: 7.1 g/dL (ref 6.5–8.1)

## 2024-06-05 LAB — CBC WITH DIFFERENTIAL/PLATELET
Abs Immature Granulocytes: 0.02 K/uL (ref 0.00–0.07)
Basophils Absolute: 0 K/uL (ref 0.0–0.1)
Basophils Relative: 0 %
Eosinophils Absolute: 0 K/uL (ref 0.0–0.5)
Eosinophils Relative: 0 %
HCT: 38.6 % (ref 36.0–46.0)
Hemoglobin: 12 g/dL (ref 12.0–15.0)
Immature Granulocytes: 0 %
Lymphocytes Relative: 18 %
Lymphs Abs: 0.8 K/uL (ref 0.7–4.0)
MCH: 27.2 pg (ref 26.0–34.0)
MCHC: 31.1 g/dL (ref 30.0–36.0)
MCV: 87.5 fL (ref 80.0–100.0)
Monocytes Absolute: 0.3 K/uL (ref 0.1–1.0)
Monocytes Relative: 7 %
Neutro Abs: 3.5 K/uL (ref 1.7–7.7)
Neutrophils Relative %: 75 %
Platelets: 318 K/uL (ref 150–400)
RBC: 4.41 MIL/uL (ref 3.87–5.11)
RDW: 13.5 % (ref 11.5–15.5)
WBC: 4.7 K/uL (ref 4.0–10.5)
nRBC: 0 % (ref 0.0–0.2)

## 2024-06-05 NOTE — ED Triage Notes (Signed)
 Pt arrived via POV c/o persistent weakness that has been present for months. Pt reports requiring previous iron  and blood transfusions as well, and reports currently experiencing a heavy menstrual cycle.

## 2024-06-27 ENCOUNTER — Encounter: Payer: PRIVATE HEALTH INSURANCE | Admitting: Nurse Practitioner

## 2024-06-28 ENCOUNTER — Ambulatory Visit: Payer: PRIVATE HEALTH INSURANCE | Admitting: Nurse Practitioner

## 2024-06-28 VITALS — BP 108/84 | HR 66 | Temp 98.0°F | Ht 69.0 in | Wt 238.0 lb

## 2024-06-28 DIAGNOSIS — D509 Iron deficiency anemia, unspecified: Secondary | ICD-10-CM | POA: Insufficient documentation

## 2024-06-28 DIAGNOSIS — R002 Palpitations: Secondary | ICD-10-CM | POA: Insufficient documentation

## 2024-06-28 DIAGNOSIS — Z0001 Encounter for general adult medical examination with abnormal findings: Secondary | ICD-10-CM | POA: Insufficient documentation

## 2024-06-28 DIAGNOSIS — F419 Anxiety disorder, unspecified: Secondary | ICD-10-CM

## 2024-06-28 DIAGNOSIS — E559 Vitamin D deficiency, unspecified: Secondary | ICD-10-CM

## 2024-06-28 DIAGNOSIS — Z6835 Body mass index (BMI) 35.0-35.9, adult: Secondary | ICD-10-CM | POA: Insufficient documentation

## 2024-06-28 DIAGNOSIS — E785 Hyperlipidemia, unspecified: Secondary | ICD-10-CM

## 2024-06-28 LAB — COMPREHENSIVE METABOLIC PANEL WITH GFR
ALT: 15 U/L (ref 3–35)
AST: 14 U/L (ref 5–37)
Albumin: 4.7 g/dL (ref 3.5–5.2)
Alkaline Phosphatase: 59 U/L (ref 39–117)
BUN: 14 mg/dL (ref 6–23)
CO2: 28 meq/L (ref 19–32)
Calcium: 9.1 mg/dL (ref 8.4–10.5)
Chloride: 103 meq/L (ref 96–112)
Creatinine, Ser: 0.74 mg/dL (ref 0.40–1.20)
GFR: 104.75 mL/min
Glucose, Bld: 88 mg/dL (ref 70–99)
Potassium: 4.1 meq/L (ref 3.5–5.1)
Sodium: 137 meq/L (ref 135–145)
Total Bilirubin: 0.4 mg/dL (ref 0.2–1.2)
Total Protein: 7.7 g/dL (ref 6.0–8.3)

## 2024-06-28 LAB — LIPID PANEL
Cholesterol: 201 mg/dL — ABNORMAL HIGH (ref 28–200)
HDL: 92.6 mg/dL
LDL Cholesterol: 98 mg/dL (ref 10–99)
NonHDL: 108.11
Total CHOL/HDL Ratio: 2
Triglycerides: 52 mg/dL (ref 10.0–149.0)
VLDL: 10.4 mg/dL (ref 0.0–40.0)

## 2024-06-28 LAB — CBC
HCT: 40.7 % (ref 36.0–46.0)
Hemoglobin: 13.1 g/dL (ref 12.0–15.0)
MCHC: 32.1 g/dL (ref 30.0–36.0)
MCV: 85.3 fl (ref 78.0–100.0)
Platelets: 271 10*3/uL (ref 150.0–400.0)
RBC: 4.77 Mil/uL (ref 3.87–5.11)
RDW: 13.9 % (ref 11.5–15.5)
WBC: 3.9 10*3/uL — ABNORMAL LOW (ref 4.0–10.5)

## 2024-06-28 LAB — VITAMIN D 25 HYDROXY (VIT D DEFICIENCY, FRACTURES): VITD: 13.21 ng/mL — ABNORMAL LOW (ref 30.00–100.00)

## 2024-06-28 LAB — FOLATE: Folate: 7.3 ng/mL

## 2024-06-28 LAB — TSH: TSH: 0.6 u[IU]/mL (ref 0.35–5.50)

## 2024-06-28 LAB — IRON: Iron: 81 ug/dL (ref 42–145)

## 2024-06-28 LAB — FERRITIN: Ferritin: 35.5 ng/mL (ref 10.0–291.0)

## 2024-06-28 LAB — HEMOGLOBIN A1C: Hgb A1c MFr Bld: 5.5 % (ref 4.6–6.5)

## 2024-06-28 LAB — VITAMIN B12: Vitamin B-12: 275 pg/mL (ref 211–911)

## 2024-06-28 NOTE — Assessment & Plan Note (Signed)
 Discussed healthy lifestyle changes. Desire to loss weight Declined Influenza vaccine,Covid Vaccine and reports being up to date with Hepatitis B vaccine. -Follow-up with OBGYN for birth control

## 2024-06-28 NOTE — Assessment & Plan Note (Addendum)
 Chronic, Stable -Currently on Iron  supplementation -Continue taking Iron  as ordered. -Monitor for increased fatigue or malaise, worsening palpitation -Further recommendation may be needed based on lab results

## 2024-06-28 NOTE — Assessment & Plan Note (Signed)
 Chronic, Stable - Continue to monitor for worsening symptoms

## 2024-06-28 NOTE — Progress Notes (Signed)
 "  Complete physical exam  Patient: April Tyler   DOB: 1990-11-16   33 y.o. Female  MRN: [id number]  Subjective:    Chief Complaint  Patient presents with   Annual Exam    April Tyler is a 34 y.o. female who presents today for a complete physical exam.  She generally feels well.  She does have additional problems to discuss today. Patent stated she wanted to discuss weight loss and interested in GLP-1s. She reports that since having a baby 5 months she want to loss weight. Reports that she has starting going back to the gym about 3 times a week and making healthy food choices. She states that her goal weight is 200 lbs, but would like to get down to 180 lbs. Patient reports chronic intermittent palpitations that has improved since taking her Iron  supplementation routinely. Denies chest pain, shortness of breath, increased heart rate and palpitations lasting longer then a few seconds. Reports that palpitations aren't bothersome.    Most recent fall risk assessment:     No data to display           Most recent depression screenings:    10/24/2023   10:09 AM 07/03/2023    3:04 PM  PHQ 2/9 Scores  PHQ - 2 Score 0 0  PHQ- 9 Score 1  0      Data saved with a previous flowsheet row definition      Patient Active Problem List   Diagnosis Date Noted   Encounter for general adult medical examination with abnormal findings 06/28/2024   Iron  deficiency anemia 06/28/2024   Class 2 obesity due to excess calories without serious comorbidity with body mass index (BMI) of 35.0 to 35.9 in adult 06/28/2024   Palpitations 06/28/2024   Normal labor 01/19/2024   Anemia affecting pregnancy 10/25/2023   History of postpartum hypertension 07/03/2023   Encounter for supervision of normal pregnancy, antepartum 06/30/2023   Hyperlipidemia 10/13/2020   Anxiety 10/13/2020   Vitamin D  deficiency 05/28/2020   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  HENT:  Negative for congestion, hearing loss, sinus pain, sore throat and tinnitus.   Eyes:  Negative for blurred vision, double vision, photophobia, pain, discharge and redness.  Respiratory:  Negative for cough, shortness of breath and wheezing.   Cardiovascular:  Positive for palpitations (intermittent, reports improvement since taking Iron  supplementation routinely). Negative for chest pain and leg swelling.  Gastrointestinal:  Negative for abdominal pain, constipation, diarrhea, heartburn and nausea.  Genitourinary:  Negative for dysuria, flank pain, frequency, hematuria and urgency.  Musculoskeletal:  Negative for back pain, falls, joint pain, myalgias and neck pain.  Skin:  Negative for rash.  Neurological:  Negative for dizziness, tingling, speech change, focal weakness, seizures, loss of consciousness, weakness and headaches.  Endo/Heme/Allergies:  Negative for environmental allergies and polydipsia. Does not bruise/bleed easily.  Psychiatric/Behavioral:  Negative for depression and memory loss. The patient is not nervous/anxious and does not have insomnia.           Objective:     BP 108/84   Pulse 66   Temp 98 F (36.7 C) (Temporal)   Ht 5' 9 (1.753 m)   Wt 238 lb (108 kg)   LMP 06/11/2024 (Exact Date)   SpO2 98%  Breastfeeding No   BMI 35.15 kg/m    Physical Exam Constitutional:      General: She is not in acute distress.    Appearance: Normal appearance. She is not ill-appearing.  HENT:     Head: Normocephalic.     Right Ear: Tympanic membrane normal.     Left Ear: Tympanic membrane normal.     Nose: No congestion or rhinorrhea.     Mouth/Throat:     Mouth: Mucous membranes are moist.     Pharynx: Oropharynx is clear.  Eyes:     Extraocular Movements: Extraocular movements intact.  Cardiovascular:     Rate  and Rhythm: Normal rate and regular rhythm.     Heart sounds: Normal heart sounds. No murmur heard.    No friction rub. No gallop.  Pulmonary:     Effort: Pulmonary effort is normal. No respiratory distress.     Breath sounds: Normal breath sounds. No wheezing.  Chest:     Chest wall: No tenderness.  Abdominal:     General: Abdomen is flat. Bowel sounds are normal. There is no distension.     Palpations: Abdomen is soft.     Tenderness: There is no abdominal tenderness. There is no guarding or rebound.  Musculoskeletal:        General: No swelling or tenderness. Normal range of motion.     Cervical back: Normal range of motion and neck supple. No rigidity or tenderness.  Lymphadenopathy:     Cervical: No cervical adenopathy.  Skin:    General: Skin is warm and dry.     Coloration: Skin is not pale.  Neurological:     Mental Status: She is alert and oriented to person, place, and time.     Motor: No weakness.     Coordination: Coordination normal.     Gait: Gait normal.  Psychiatric:        Mood and Affect: Mood normal.        Behavior: Behavior normal.        Thought Content: Thought content normal.        Judgment: Judgment normal.      No results found for any visits on 06/28/24. Last CBC Lab Results  Component Value Date   WBC 4.7 06/05/2024   HGB 12.0 06/05/2024   HCT 38.6 06/05/2024   MCV 87.5 06/05/2024   MCH 27.2 06/05/2024   RDW 13.5 06/05/2024   PLT 318 06/05/2024   Last metabolic panel Lab Results  Component Value Date   GLUCOSE 84 06/05/2024   NA 138 06/05/2024   K 4.1 06/05/2024   CL 102 06/05/2024   CO2 24 06/05/2024   BUN 10 06/05/2024   CREATININE 0.63 06/05/2024   GFRNONAA >60 06/05/2024   CALCIUM 9.0 06/05/2024   PROT 7.1 06/05/2024   ALBUMIN 4.5 06/05/2024   LABGLOB 2.6 07/03/2023   BILITOT 0.5 06/05/2024   ALKPHOS 70 06/05/2024   AST 16 06/05/2024   ALT 13 06/05/2024   ANIONGAP 11 06/05/2024        Assessment & Plan:    Routine  Health Maintenance and Physical Exam  Immunization History  Administered Date(s) Administered   Influenza,inj,Quad PF,6+ Mos 06/15/2017   Moderna Sars-Covid-2 Vaccination 06/24/2019, 07/10/2020   Td 05/24/2015    Health Maintenance  Topic Date Due   Hepatitis B Vaccines 19-59 Average Risk (1 of 3 - 19+ 3-dose series) Never done   Influenza Vaccine  12/22/2023   COVID-19 Vaccine (4 - 2025-26  season) 01/22/2024   DTaP/Tdap/Td (2 - Tdap) 05/23/2025   Cervical Cancer Screening (HPV/Pap Cotest)  07/02/2028   HPV VACCINES (No Doses Required) Completed   Hepatitis C Screening  Completed   HIV Screening  Completed   Pneumococcal Vaccine  Aged Out   Meningococcal B Vaccine  Aged Out    Discussed health benefits of physical activity, and encouraged her to engage in regular exercise appropriate for her age and condition.  Problem List Items Addressed This Visit       Other   Vitamin D  deficiency   Chronic  Patient no longer taking Vitamin D  supplement -Will check Vitamin D  level today and further recommendation may be needed based on lab results      Relevant Orders   CBC   Hemoglobin A1c   Comprehensive metabolic panel with GFR   Lipid panel   TSH   Vitamin B12   Folate   Vitamin D  (25 hydroxy)   Iron    Ferritin   Hyperlipidemia   Patient at risk for elevated lipid due to Obesity -Will check a Lipid panel today and further recommendation may be needed based on lab results -Encouraged healthy lifestyle choices      Relevant Orders   CBC   Hemoglobin A1c   Comprehensive metabolic panel with GFR   Lipid panel   TSH   Vitamin B12   Folate   Vitamin D  (25 hydroxy)   Iron    Ferritin   Anxiety   Chronic, Stable - Continue to monitor for worsening symptoms       Relevant Orders   CBC   Hemoglobin A1c   Comprehensive metabolic panel with GFR   Lipid panel   TSH   Vitamin B12   Folate   Vitamin D  (25 hydroxy)   Iron    Ferritin   Encounter for general adult  medical examination with abnormal findings - Primary   Discussed healthy lifestyle changes. Desire to loss weight Declined Influenza vaccine,Covid Vaccine and reports being up to date with Hepatitis B vaccine. -Follow-up with OBGYN for birth control      Relevant Orders   CBC   Hemoglobin A1c   Comprehensive metabolic panel with GFR   Lipid panel   TSH   Vitamin B12   Folate   Vitamin D  (25 hydroxy)   Iron    Ferritin   Iron  deficiency anemia   Chronic, Stable -Currently on Iron  supplementation -Continue taking Iron  as ordered. -Monitor for increased fatigue or malaise, worsening palpitation -Further recommendation may be needed based on lab results       Relevant Orders   CBC   Hemoglobin A1c   Comprehensive metabolic panel with GFR   Lipid panel   TSH   Vitamin B12   Folate   Vitamin D  (25 hydroxy)   Iron    Ferritin   Class 2 obesity due to excess calories without serious comorbidity with body mass index (BMI) of 35.0 to 35.9 in adult   Patient desires to loss weight.  Counseled on healthy lifestyle changes, advised to attempt to limit calories to 1100-1200 a day and follow-up a low glycemic index diet. -Increase water intake to 60 oz daily -Encourage to exercise 150 min a weekly  -Follow-up with OBGYN for birth control      Relevant Orders   CBC   Hemoglobin A1c   Comprehensive metabolic panel with GFR   Lipid panel   TSH   Vitamin B12   Folate   Vitamin D  (  25 hydroxy)   Iron    Ferritin   Palpitations   Chronic intermittent, Patient reports chronic intermittent palpitations that has improved since taking her Iron  supplementation routinely. -Discussed Red Flag and to call the office if sx of Chest pain, Shortness of breath, Increased heart rate and Palpitations not resolving after a few seconds. -Denies any of above  symptoms -Discussed work-up for palpitations with patient and she declined work-up at this time.       In addition to annual physical an  office visit as detailed above was completed.   Return in about 3 months (around 09/25/2024) for F/U with Sarah.     Tinnie LITTIE Limes, RN        [1]  Outpatient Medications Prior to Visit  Medication Sig   acetaminophen  (TYLENOL ) 325 MG tablet Take 2 tablets (650 mg total) by mouth every 4 (four) hours as needed for up to 180 doses (for pain scale < 4).   Ferrous Fumarate  (HEMOCYTE - 106 MG FE) 324 (106 Fe) MG TABS tablet Take 1 tablet (106 mg of iron  total) by mouth every other day.   ibuprofen  (ADVIL ) 600 MG tablet Take 1 tablet (600 mg total) by mouth every 6 (six) hours.   Prenatal Vit-Fe Fumarate-FA (PRENATAL VITAMIN PO) Take by mouth.   senna-docusate (SENOKOT-S) 8.6-50 MG tablet Take 2 tablets by mouth daily.   potassium chloride  SA (KLOR-CON  M) 20 MEQ tablet Take 1 tablet (20 mEq total) by mouth daily for 3 doses. (Patient not taking: Reported on 06/28/2024)   [DISCONTINUED] furosemide  (LASIX ) 20 MG tablet Take 1 tablet (20 mg total) by mouth daily for 3 days.   No facility-administered medications prior to visit.   "

## 2024-06-28 NOTE — Assessment & Plan Note (Addendum)
 Patient desires to loss weight.  Counseled on healthy lifestyle changes, advised to attempt to limit calories to 1100-1200 a day and follow-up a low glycemic index diet. -Increase water intake to 60 oz daily -Encourage to exercise 150 min a weekly  -Follow-up with OBGYN for birth control

## 2024-06-28 NOTE — Assessment & Plan Note (Addendum)
 Chronic  Patient no longer taking Vitamin D  supplement -Will check Vitamin D  level today and further recommendation may be needed based on lab results

## 2024-06-28 NOTE — Assessment & Plan Note (Addendum)
 Patient at risk for elevated lipid due to Obesity -Will check a Lipid panel today and further recommendation may be needed based on lab results -Encouraged healthy lifestyle choices

## 2024-06-28 NOTE — Assessment & Plan Note (Signed)
 Chronic intermittent, Patient reports chronic intermittent palpitations that has improved since taking her Iron  supplementation routinely. -Discussed Red Flag and to call the office if sx of Chest pain, Shortness of breath, Increased heart rate and Palpitations not resolving after a few seconds. -Denies any of above  symptoms -Discussed work-up for palpitations with patient and she declined work-up at this time.

## 2024-09-27 ENCOUNTER — Ambulatory Visit: Payer: PRIVATE HEALTH INSURANCE | Admitting: Nurse Practitioner
